# Patient Record
Sex: Male | Born: 1945
Health system: Southern US, Community
[De-identification: ages and names within clinical notes are randomized; demographics above are authoritative.]

## PROBLEM LIST (undated history)

## (undated) DIAGNOSIS — M8580 Other specified disorders of bone density and structure, unspecified site: Secondary | ICD-10-CM

## (undated) DIAGNOSIS — G540 Brachial plexus disorders: Secondary | ICD-10-CM

## (undated) DIAGNOSIS — I639 Cerebral infarction, unspecified: Secondary | ICD-10-CM

## (undated) DIAGNOSIS — E785 Hyperlipidemia, unspecified: Secondary | ICD-10-CM

## (undated) DIAGNOSIS — H918X9 Other specified hearing loss, unspecified ear: Secondary | ICD-10-CM

## (undated) DIAGNOSIS — G459 Transient cerebral ischemic attack, unspecified: Secondary | ICD-10-CM

## (undated) DIAGNOSIS — Z77098 Contact with and (suspected) exposure to other hazardous, chiefly nonmedicinal, chemicals: Secondary | ICD-10-CM

## (undated) DIAGNOSIS — H9190 Unspecified hearing loss, unspecified ear: Secondary | ICD-10-CM

## (undated) DIAGNOSIS — I5189 Other ill-defined heart diseases: Secondary | ICD-10-CM

## (undated) DIAGNOSIS — K219 Gastro-esophageal reflux disease without esophagitis: Secondary | ICD-10-CM

## (undated) DIAGNOSIS — C801 Malignant (primary) neoplasm, unspecified: Secondary | ICD-10-CM

## (undated) DIAGNOSIS — R41841 Cognitive communication deficit: Secondary | ICD-10-CM

## (undated) DIAGNOSIS — S62109A Fracture of unspecified carpal bone, unspecified wrist, initial encounter for closed fracture: Secondary | ICD-10-CM

## (undated) DIAGNOSIS — M7740 Metatarsalgia, unspecified foot: Secondary | ICD-10-CM

## (undated) DIAGNOSIS — M6530 Trigger finger, unspecified finger: Secondary | ICD-10-CM

## (undated) DIAGNOSIS — M722 Plantar fascial fibromatosis: Secondary | ICD-10-CM

## (undated) DIAGNOSIS — R0789 Other chest pain: Secondary | ICD-10-CM

## (undated) DIAGNOSIS — F0280 Dementia in other diseases classified elsewhere without behavioral disturbance: Secondary | ICD-10-CM

## (undated) DIAGNOSIS — M545 Low back pain, unspecified: Secondary | ICD-10-CM

## (undated) DIAGNOSIS — G219 Secondary parkinsonism, unspecified: Secondary | ICD-10-CM

## (undated) DIAGNOSIS — I471 Supraventricular tachycardia, unspecified: Secondary | ICD-10-CM

## (undated) DIAGNOSIS — R471 Dysarthria and anarthria: Secondary | ICD-10-CM

## (undated) DIAGNOSIS — J45909 Unspecified asthma, uncomplicated: Secondary | ICD-10-CM

## (undated) DIAGNOSIS — N400 Enlarged prostate without lower urinary tract symptoms: Secondary | ICD-10-CM

## (undated) DIAGNOSIS — M2550 Pain in unspecified joint: Secondary | ICD-10-CM

## (undated) DIAGNOSIS — R55 Syncope and collapse: Secondary | ICD-10-CM

## (undated) DIAGNOSIS — H918X3 Other specified hearing loss, bilateral: Secondary | ICD-10-CM

## (undated) DIAGNOSIS — R001 Bradycardia, unspecified: Secondary | ICD-10-CM

## (undated) DIAGNOSIS — G5600 Carpal tunnel syndrome, unspecified upper limb: Secondary | ICD-10-CM

## (undated) HISTORY — DX: Other specified disorders of bone density and structure, unspecified site: M85.80

## (undated) HISTORY — PX: WRIST SURGERY: SHX841

## (undated) HISTORY — DX: Brachial plexus disorders: G54.0

## (undated) HISTORY — DX: Bradycardia, unspecified: R00.1

## (undated) HISTORY — DX: Other specified hearing loss, bilateral: H91.8X3

## (undated) HISTORY — DX: Other chest pain: R07.89

## (undated) HISTORY — DX: Unspecified asthma, uncomplicated: J45.909

## (undated) HISTORY — DX: Metatarsalgia, unspecified foot: M77.40

## (undated) HISTORY — DX: Cerebral infarction, unspecified: I63.9

## (undated) HISTORY — DX: Hyperlipidemia, unspecified: E78.5

## (undated) HISTORY — DX: Low back pain, unspecified: M54.50

## (undated) HISTORY — DX: Pain in unspecified joint: M25.50

## (undated) HISTORY — PX: BACK SURGERY: SHX140

## (undated) HISTORY — DX: Fracture of unspecified carpal bone, unspecified wrist, initial encounter for closed fracture: S62.109A

## (undated) HISTORY — DX: Plantar fascial fibromatosis: M72.2

## (undated) HISTORY — DX: Contact with and (suspected) exposure to other hazardous, chiefly nonmedicinal, chemicals: Z77.098

## (undated) HISTORY — DX: Benign prostatic hyperplasia without lower urinary tract symptoms: N40.0

## (undated) HISTORY — DX: Other specified hearing loss, unspecified ear: H91.8X9

## (undated) HISTORY — DX: Malignant (primary) neoplasm, unspecified: C80.1

## (undated) HISTORY — DX: Transient cerebral ischemic attack, unspecified: G45.9

## (undated) HISTORY — DX: Low back pain: M54.5

## (undated) HISTORY — DX: Carpal tunnel syndrome, unspecified upper limb: G56.00

## (undated) HISTORY — DX: Trigger finger, unspecified finger: M65.30

---

## 2007-05-02 ENCOUNTER — Encounter: Admission: RE | Admit: 2007-05-02 | Discharge: 2007-05-02 | Payer: Self-pay | Admitting: Neurosurgery

## 2012-04-05 ENCOUNTER — Encounter: Payer: Self-pay | Admitting: *Deleted

## 2012-04-07 ENCOUNTER — Encounter: Payer: Self-pay | Admitting: *Deleted

## 2012-05-07 ENCOUNTER — Encounter: Payer: Self-pay | Admitting: *Deleted

## 2018-11-22 ENCOUNTER — Ambulatory Visit: Payer: Self-pay | Admitting: Cardiovascular Disease

## 2018-12-07 ENCOUNTER — Ambulatory Visit: Payer: Self-pay | Admitting: Cardiovascular Disease

## 2018-12-19 NOTE — Progress Notes (Signed)
Cardiology Office Note   Date:  12/20/2018   ID:  Roger Sanchez Jul 31, 1946, MRN [id number]  PCP:  Benito Mccreedy, MD  Cardiologist:  Kathlyn Sacramento, MD    Chief Complaint  Patient presents with   other    Consult for Echo C/o chest discomfort, sob and syncopal spells. Meds reviewed verbally with pt.      History of Present Illness: Roger Sanchez is a 73 y.o. male who was referred from the [place] for evaluation of dyspnea on exertion. He has past history of asthma and hyperlipidemia. History of TIA/ stroke with residual balance deficits. Hx of bradycardia. He was seen recently for concerning symptoms of  bradycardia but it was noted his bradycardia has been present for at least ten years. He had recent treadmill stress test in November 2019 which was negative for ischemia. Although he did experience chest pain with exertion at that time. He was bradycardic at baseline in the 50's, but his heart rate increased appropriately with exertion to 150 bpm.   He reports SOB and dizziness, adding he has never passed out. He works part time at [employer] and feels dizzy when he reaches the top flight of stairs. However, the dizziness symptoms have improved.   He stays active by walking frequently. He used to walk 5 miles at a time, but has only been able to walk 3 miles at a time. Occasionally when he is walking at an incline is when he notices an increase in SOB, dizziness and chest discomfort.   He has never smoked, but has been told he has COPD and asthma. Family history of heart diease at a fairly young age. He reports having a Holter monitor done at the [place] which was unremarkable.   Past Medical History:  Diagnosis Date   Arthralgia    shoulder    Asthma    Asymmetrical hearing loss    Atypical chest pain    Brachial plexus disorders    Bradycardia    Carpal tunnel syndrome    Cerebral infarction (HCC)    Closed fracture of wrist    Exposure to Agent Orange     Hyperlipidemia    Hypertrophy of prostate    benign    Low back pain    Lumbar spine pain    compression fracture    Malignant neoplasm (HCC)    skin    Metatarsalgia    Osteopenia    Plantar fibromatosis    Stroke (HCC)    Transient ischemic attack    Trigger finger     Past Surgical History:  Procedure Laterality Date   BACK SURGERY     WRIST SURGERY Right      Current Outpatient Medications  Medication Sig Dispense Refill   acetaminophen (TYLENOL) 325 MG tablet Take 650 mg by mouth every 6 (six) hours as needed.     albuterol (PROVENTIL) (2.5 MG/3ML) 0.083% nebulizer solution Take 2.5 mg by nebulization every 6 (six) hours as needed for wheezing or shortness of breath.     Albuterol Sulfate 108 (90 Base) MCG/ACT AEPB Inhale into the lungs.     aspirin EC 81 MG tablet Take 81 mg by mouth daily.     atorvastatin (LIPITOR) 20 MG tablet Take 20 mg by mouth daily.     clopidogrel (PLAVIX) 75 MG tablet Take 75 mg by mouth daily.     cyclobenzaprine (FLEXERIL) 10 MG tablet Take 10 mg by mouth 2 (two) times daily  as needed for muscle spasms.     diclofenac sodium (VOLTAREN) 1 % GEL Apply topically 4 (four) times daily.     montelukast (SINGULAIR) 10 MG tablet Take 10 mg by mouth at bedtime.     Multiple Vitamins-Minerals (MULTI-VITAMIN/MINERALS PO) Take by mouth.     naproxen (NAPROSYN) 500 MG tablet Take 500 mg by mouth 2 (two) times daily with a meal.     pantoprazole (PROTONIX) 40 MG tablet Take 40 mg by mouth daily.     ranitidine (ZANTAC) 150 MG capsule Take 150 mg by mouth 2 (two) times daily.     No current facility-administered medications for this visit.     Allergies:   Barley grass; Corn-containing products; Malt; Milk-related compounds; Peanut-containing drug products; Wheat bran; and Zocor [simvastatin]    Social History:  The patient  reports that he has never smoked. He has never used smokeless tobacco. He reports that he does not drink  alcohol or use drugs.   Family History:  The patient's family history includes Heart Problems in his father; Heart attack in his paternal aunt and paternal uncle.    ROS:  Please see the history of present illness.   Otherwise, review of systems are positive for none.   All other systems are reviewed and negative.    PHYSICAL EXAM: VS:  BP 138/81 (BP Location: Left Arm, Patient Position: Sitting, Cuff Size: Normal)    Pulse 60    Ht 5\' 6"  (1.676 m)    Wt 188 lb (85.3 kg)    SpO2 97%    BMI 30.34 kg/m  , BMI Body mass index is 30.34 kg/m. GEN: Well nourished, well developed, in no acute distress  HEENT: normal  Neck: no JVD, carotid bruits, or masses Cardiac: 2/6  holosystolic murmur at left sternal border, rubs, or gallops,no edema  Respiratory:  clear to auscultation bilaterally, normal work of breathing GI: soft, nontender, nondistended, + BS MS: no deformity or atrophy  Skin: warm and dry, no rash Neuro:  Strength and sensation are intact Psych: euthymic mood, full affect   EKG:  EKG is ordered today. The ekg ordered today demonstrates normal sinus rhythm with no significant ST or T wave changes.  Heart rate is 60 bpm.   Recent Labs: No results found for requested labs within last 8760 hours.    Lipid Panel No results found for: CHOL, TRIG, HDL, CHOLHDL, VLDL, LDLCALC, LDLDIRECT    Wt Readings from Last 3 Encounters:  12/20/18 188 lb (85.3 kg)     ASSESSMENT AND PLAN:  1.  Shortness of Breath: He does have a cardiac murmur at the left sternal border with no recent evaluation.  Recommend expedited Echocardiogram. Exertion chest pain is concerning for class II angina. Most recent stress test in December 2019 showed no evidence of ischemia.  If symptoms persist in spite of continued medical therapy, consider nuclear stress testing or CTA of the coronary arteries.  2.  Previous TIA/ stroke: He is Plavix for that indication  3.  HLD: Currently on atorvastatin. Recommend a  target LDL of > 70   4.  Dizziness with mild sinus tachycardia. The patient had a holter monitor the [place] which was unremarkable. If he has recurrent symptoms, consider prolonged monitoring.    Disposition:   Continue follow-up with cardiology at the [place].  We will obtain an echocardiogram and forward to them as requested.  I, Margit Banda am acting as a Education administrator for Kathlyn Sacramento, M.D.  I  have reviewed the above documentation for accuracy and completeness, and I agree with the above.   Signed, Kathlyn Sacramento, MD 12/20/18 [place], [place]

## 2018-12-20 ENCOUNTER — Encounter: Payer: Self-pay | Admitting: Cardiovascular Disease

## 2018-12-20 ENCOUNTER — Ambulatory Visit (INDEPENDENT_AMBULATORY_CARE_PROVIDER_SITE_OTHER): Payer: No Typology Code available for payment source | Admitting: Cardiovascular Disease

## 2018-12-20 VITALS — BP 138/81 | HR 60 | Ht 66.0 in | Wt 188.0 lb

## 2018-12-20 DIAGNOSIS — R0602 Shortness of breath: Secondary | ICD-10-CM

## 2018-12-20 DIAGNOSIS — R011 Cardiac murmur, unspecified: Secondary | ICD-10-CM

## 2018-12-20 DIAGNOSIS — E785 Hyperlipidemia, unspecified: Secondary | ICD-10-CM

## 2018-12-20 NOTE — Patient Instructions (Signed)
Medication Instructions:  No changes If you need a refill on your cardiac medications before your next appointment, please call your pharmacy.   Lab work: None ordered  Testing/Procedures: Your physician has requested that you have an echocardiogram. Echocardiography is a painless test that uses sound waves to create images of your heart. It provides your doctor with information about the size and shape of your heart and how well your heart's chambers and valves are working. You may receive an ultrasound enhancing agent through an IV if needed to better visualize your heart during the echo.This procedure takes approximately one hour. There are no restrictions for this procedure. This will take place at the [facility] clinic.    Follow-Up: Follow as needed with Dr. Fletcher Anon

## 2018-12-28 ENCOUNTER — Ambulatory Visit (INDEPENDENT_AMBULATORY_CARE_PROVIDER_SITE_OTHER): Payer: No Typology Code available for payment source

## 2018-12-28 ENCOUNTER — Encounter

## 2018-12-28 DIAGNOSIS — R0602 Shortness of breath: Secondary | ICD-10-CM | POA: Diagnosis not present

## 2020-01-20 DIAGNOSIS — H2513 Age-related nuclear cataract, bilateral: Secondary | ICD-10-CM | POA: Diagnosis not present

## 2020-09-07 DIAGNOSIS — H02834 Dermatochalasis of left upper eyelid: Secondary | ICD-10-CM | POA: Diagnosis not present

## 2020-09-07 DIAGNOSIS — H02831 Dermatochalasis of right upper eyelid: Secondary | ICD-10-CM | POA: Diagnosis not present

## 2020-09-10 DIAGNOSIS — H02403 Unspecified ptosis of bilateral eyelids: Secondary | ICD-10-CM | POA: Diagnosis not present

## 2021-03-18 ENCOUNTER — Other Ambulatory Visit: Payer: Self-pay

## 2021-03-18 ENCOUNTER — Encounter: Payer: Self-pay | Admitting: Ophthalmology

## 2021-03-22 NOTE — Discharge Instructions (Signed)
INSTRUCTIONS FOLLOWING OCULOPLASTIC SURGERY AMY M. FOWLER, MD  AFTER YOUR EYE SURGERY, THER ARE MANY THINGS WHICH YOU, THE PATIENT, CAN DO TO ASSURE THE BEST POSSIBLE RESULT FROM YOUR OPERATION.  THIS SHEET SHOULD BE REFERRED TO WHENEVER QUESTIONS ARISE.  IF THERE ARE ANY QUESTIONS NOT ANSWERED HERE, DO NOT HESITATE TO CALL OUR OFFICE AT [phone] OR [phone].  THERE IS ALWAYS SOMEONE AVAILABLE TO CALL IF QUESTIONS OR PROBLEMS ARISE.  VISION: Your vision may be blurred and out of focus after surgery until you are able to stop using your ointment, swelling resolves and your eye(s) heal. This may take 1 to 2 weeks at the least.  If your vision becomes gradually more dim or dark, this is not normal and you need to call our office immediately.  EYE CARE: For the first 48 hours after surgery, use ice packs frequently - "20 minutes on, 20 minutes off" - to help reduce swelling and bruising.  Small bags of frozen peas or corn make good ice packs along with cloths soaked in ice water.  If you are wearing a patch or other type of dressing following surgery, keep this on for the amount of time specified by your doctor.  For the first week following surgery, you will need to treat your stitches with great care.  It is OK to shower, but take care to not allow soapy water to run into your eye(s) to help reduce chances of infection.  You may gently clean the eyelashes and around the eye(s) with cotton balls and sterile water, BUT DO NOT RUB THE STITCHES VIGOROUSLY.  Keeping your stitches moist with ointment will help promote healing with minimal scar formation.  ACTIVITY: When you leave the surgery center, you should go home, rest and be inactive.  The eye(s) may feel scratchy and keeping the eyes closed will allow for faster healing.  The first week following surgery, avoid straining (anything making the face turn red) or lifting over 20 pounds.  Additionally, avoid bending which causes your head to go below  your waist.  Using your eyes will NOT harm them, so feel free to read, watch television, use the computer, etc as desired.  Driving depends on each individual, so check with your doctor if you have questions about driving. Do not wear contact lenses for about 2 weeks.  Do not wear eye makeup for 2 weeks.  Avoid swimming, hot tubs, gardening, and dusting for 1 to 2 weeks to reduce the risk of an infection.  MEDICATIONS:  You will be given a prescription for an ointment to use 4 times a day on your stitches.  You can use the ointment in your eyes if they feel scratchy or irritated.  If you eyelid(s) don't close completely when you sleep, put some ointment in your eyes before bedtime.  EMERGENCY: If you experience SEVERE EYE PAIN OR HEADACHE UNRELIEVED BY TYLENOL OR TRAMADOL, NAUSEA OR VOMITING, WORSENING REDNESS, OR WORSENING VISION (ESPECIALLY VISION THAT WAS INITIALLY BETTER) CALL [phone] OR [phone] DURING BUSINESS HOURS OR AFTER HOURS. General Anesthesia, Adult, Care After This sheet gives you information about how to care for yourself after your procedure. Your health care provider may also give you more specific instructions. If you have problems or questions, contact your health care provider. What can I expect after the procedure? After the procedure, the following side effects are common:  Pain or discomfort at the IV site.  Nausea.  Vomiting.  Sore throat.  Trouble concentrating.  Feeling   cold or chills.  Feeling weak or tired.  Sleepiness and fatigue.  Soreness and body aches. These side effects can affect parts of the body that were not involved in surgery. Follow these instructions at home: For the time period you were told by your health care provider:  Rest.  Do not participate in activities where you could fall or become injured.  Do not drive or use machinery.  Do not drink alcohol.  Do not take sleeping pills or medicines that cause drowsiness.  Do  not make important decisions or sign legal documents.  Do not take care of children on your own.   Eating and drinking  Follow any instructions from your health care provider about eating or drinking restrictions.  When you feel hungry, start by eating small amounts of foods that are soft and easy to digest (bland), such as toast. Gradually return to your regular diet.  Drink enough fluid to keep your urine pale yellow.  If you vomit, rehydrate by drinking water, juice, or clear broth. General instructions  If you have sleep apnea, surgery and certain medicines can increase your risk for breathing problems. Follow instructions from your health care provider about wearing your sleep device: ? Anytime you are sleeping, including during daytime naps. ? While taking prescription pain medicines, sleeping medicines, or medicines that make you drowsy.  Have a responsible adult stay with you for the time you are told. It is important to have someone help care for you until you are awake and alert.  Return to your normal activities as told by your health care provider. Ask your health care provider what activities are safe for you.  Take over-the-counter and prescription medicines only as told by your health care provider.  If you smoke, do not smoke without supervision.  Keep all follow-up visits as told by your health care provider. This is important. Contact a health care provider if:  You have nausea or vomiting that does not get better with medicine.  You cannot eat or drink without vomiting.  You have pain that does not get better with medicine.  You are unable to pass urine.  You develop a skin rash.  You have a fever.  You have redness around your IV site that gets worse. Get help right away if:  You have difficulty breathing.  You have chest pain.  You have blood in your urine or stool, or you vomit blood. Summary  After the procedure, it is common to have a sore  throat or nausea. It is also common to feel tired.  Have a responsible adult stay with you for the time you are told. It is important to have someone help care for you until you are awake and alert.  When you feel hungry, start by eating small amounts of foods that are soft and easy to digest (bland), such as toast. Gradually return to your regular diet.  Drink enough fluid to keep your urine pale yellow.  Return to your normal activities as told by your health care provider. Ask your health care provider what activities are safe for you. This information is not intended to replace advice given to you by your health care provider. Make sure you discuss any questions you have with your health care provider. Document Revised: 07/09/2020 Document Reviewed: 02/06/2020 Elsevier Patient Education  2021 [employer]vier Inc.  

## 2021-03-24 DIAGNOSIS — L570 Actinic keratosis: Secondary | ICD-10-CM | POA: Diagnosis not present

## 2021-03-24 DIAGNOSIS — L905 Scar conditions and fibrosis of skin: Secondary | ICD-10-CM | POA: Diagnosis not present

## 2021-03-24 DIAGNOSIS — D2272 Melanocytic nevi of left lower limb, including hip: Secondary | ICD-10-CM | POA: Diagnosis not present

## 2021-03-24 DIAGNOSIS — D2262 Melanocytic nevi of left upper limb, including shoulder: Secondary | ICD-10-CM | POA: Diagnosis not present

## 2021-03-24 DIAGNOSIS — D2271 Melanocytic nevi of right lower limb, including hip: Secondary | ICD-10-CM | POA: Diagnosis not present

## 2021-03-24 DIAGNOSIS — L821 Other seborrheic keratosis: Secondary | ICD-10-CM | POA: Diagnosis not present

## 2021-03-24 DIAGNOSIS — D2261 Melanocytic nevi of right upper limb, including shoulder: Secondary | ICD-10-CM | POA: Diagnosis not present

## 2021-03-24 DIAGNOSIS — D225 Melanocytic nevi of trunk: Secondary | ICD-10-CM | POA: Diagnosis not present

## 2021-03-26 ENCOUNTER — Ambulatory Visit: Payer: PPO | Admitting: Anesthesiology

## 2021-03-26 ENCOUNTER — Ambulatory Visit
Admission: RE | Admit: 2021-03-26 | Discharge: 2021-03-26 | Disposition: A | Discharge status: Home / Self Care | Payer: PPO | Attending: Ophthalmology | Admitting: Ophthalmology

## 2021-03-26 ENCOUNTER — Encounter
Admission: RE | Disposition: A | Discharge status: Home / Self Care | Payer: Self-pay | Source: Home / Self Care | Attending: Ophthalmology

## 2021-03-26 ENCOUNTER — Other Ambulatory Visit: Payer: Self-pay

## 2021-03-26 ENCOUNTER — Encounter: Payer: Self-pay | Admitting: Ophthalmology

## 2021-03-26 DIAGNOSIS — Z8673 Personal history of transient ischemic attack (TIA), and cerebral infarction without residual deficits: Secondary | ICD-10-CM | POA: Insufficient documentation

## 2021-03-26 DIAGNOSIS — H57813 Brow ptosis, bilateral: Secondary | ICD-10-CM | POA: Insufficient documentation

## 2021-03-26 DIAGNOSIS — Z79899 Other long term (current) drug therapy: Secondary | ICD-10-CM | POA: Insufficient documentation

## 2021-03-26 DIAGNOSIS — H02834 Dermatochalasis of left upper eyelid: Secondary | ICD-10-CM | POA: Insufficient documentation

## 2021-03-26 DIAGNOSIS — Z91011 Allergy to milk products: Secondary | ICD-10-CM | POA: Diagnosis not present

## 2021-03-26 DIAGNOSIS — Z888 Allergy status to other drugs, medicaments and biological substances status: Secondary | ICD-10-CM | POA: Diagnosis not present

## 2021-03-26 DIAGNOSIS — Z91010 Allergy to peanuts: Secondary | ICD-10-CM | POA: Diagnosis not present

## 2021-03-26 DIAGNOSIS — Z91018 Allergy to other foods: Secondary | ICD-10-CM | POA: Insufficient documentation

## 2021-03-26 DIAGNOSIS — H02831 Dermatochalasis of right upper eyelid: Secondary | ICD-10-CM | POA: Insufficient documentation

## 2021-03-26 DIAGNOSIS — Z7982 Long term (current) use of aspirin: Secondary | ICD-10-CM | POA: Diagnosis not present

## 2021-03-26 DIAGNOSIS — H02403 Unspecified ptosis of bilateral eyelids: Secondary | ICD-10-CM | POA: Diagnosis not present

## 2021-03-26 DIAGNOSIS — Z85828 Personal history of other malignant neoplasm of skin: Secondary | ICD-10-CM | POA: Insufficient documentation

## 2021-03-26 DIAGNOSIS — Z7902 Long term (current) use of antithrombotics/antiplatelets: Secondary | ICD-10-CM | POA: Insufficient documentation

## 2021-03-26 HISTORY — DX: Unspecified hearing loss, unspecified ear: H91.90

## 2021-03-26 HISTORY — PX: BROW LIFT: SHX178

## 2021-03-26 HISTORY — DX: Gastro-esophageal reflux disease without esophagitis: K21.9

## 2021-03-26 SURGERY — BLEPHAROPLASTY
Anesthesia: Monitor Anesthesia Care | Laterality: Bilateral

## 2021-03-26 MED ORDER — PROPOFOL 500 MG/50ML IV EMUL
INTRAVENOUS | Status: DC | PRN
  Administered 2021-03-26: 25 ug/kg/min via INTRAVENOUS

## 2021-03-26 MED ORDER — ALFENTANIL 500 MCG/ML IJ INJ
INJECTION | INTRAVENOUS | Status: DC | PRN
  Administered 2021-03-26 (×2): 250 ug via INTRAVENOUS

## 2021-03-26 MED ORDER — ACETAMINOPHEN 325 MG PO TABS: 325.0000 mg | ORAL_TABLET | ORAL | Status: DC | PRN

## 2021-03-26 MED ORDER — ACETAMINOPHEN 160 MG/5ML PO SOLN: 325.0000 mg | ORAL | Status: DC | PRN

## 2021-03-26 MED ORDER — ERYTHROMYCIN 5 MG/GM OP OINT: TOPICAL_OINTMENT | OPHTHALMIC | 2 refills | Status: DC

## 2021-03-26 MED ORDER — LACTATED RINGERS IV SOLN: INTRAVENOUS | Status: DC

## 2021-03-26 MED ORDER — BSS IO SOLN
INTRAOCULAR | Status: DC | PRN
  Administered 2021-03-26: 30 mL

## 2021-03-26 MED ORDER — TETRACAINE HCL 0.5 % OP SOLN
OPHTHALMIC | Status: DC | PRN
  Administered 2021-03-26: 2 [drp] via OPHTHALMIC

## 2021-03-26 MED ORDER — LIDOCAINE HCL (CARDIAC) PF 100 MG/5ML IV SOSY
PREFILLED_SYRINGE | INTRAVENOUS | Status: DC | PRN
  Administered 2021-03-26: 40 mg via INTRAVENOUS

## 2021-03-26 MED ORDER — MIDAZOLAM HCL 2 MG/2ML IJ SOLN
INTRAMUSCULAR | Status: DC | PRN
  Administered 2021-03-26 (×2): 1 mg via INTRAVENOUS

## 2021-03-26 MED ORDER — LIDOCAINE-EPINEPHRINE 2 %-1:100000 IJ SOLN
INTRAMUSCULAR | Status: DC | PRN
  Administered 2021-03-26: 7 mL via OPHTHALMIC

## 2021-03-26 MED ORDER — TRAMADOL HCL 50 MG PO TABS: ORAL_TABLET | ORAL | 0 refills | Status: DC

## 2021-03-26 SURGICAL SUPPLY — 38 items
APPLICATOR COTTON TIP WD 3 STR (MISCELLANEOUS) ×2 IMPLANT
BLADE SURG 15 STRL LF DISP TIS (BLADE) ×1 IMPLANT
BLADE SURG 15 STRL SS (BLADE) ×4
CORD BIP STRL DISP 12FT (MISCELLANEOUS) ×2 IMPLANT
GAUZE 4X4 16PLY ~~LOC~~+RFID DBL (SPONGE) ×1 IMPLANT
GAUZE SPONGE 4X4 12PLY STRL (GAUZE/BANDAGES/DRESSINGS) ×2 IMPLANT
GLOVE SURG LX 7.0 MICRO (GLOVE) ×2
GLOVE SURG LX STRL 7.0 MICRO (GLOVE) ×2 IMPLANT
GOWN STRL REUS W/ TWL LRG LVL3 (GOWN DISPOSABLE) ×1 IMPLANT
GOWN STRL REUS W/TWL LRG LVL3 (GOWN DISPOSABLE) ×2
MARKER SKIN XFINE TIP W/RULER (MISCELLANEOUS) ×2 IMPLANT
NDL FILTER BLUNT 18X1 1/2 (NEEDLE) ×1 IMPLANT
NDL HYPO 30X.5 LL (NEEDLE) ×2 IMPLANT
NEEDLE FILTER BLUNT 18X 1/2SAF (NEEDLE) ×1
NEEDLE FILTER BLUNT 18X1 1/2 (NEEDLE) ×1 IMPLANT
NEEDLE HYPO 30X.5 LL (NEEDLE) ×4 IMPLANT
PACK ENT CUSTOM (PACKS) ×2 IMPLANT
SOL PREP PVP 2OZ (MISCELLANEOUS) ×2
SOLUTION PREP PVP 2OZ (MISCELLANEOUS) ×1 IMPLANT
SPONGE GAUZE 2X2 8PLY STRL LF (GAUZE/BANDAGES/DRESSINGS) ×20 IMPLANT
SUT CHROMIC 4-0 (SUTURE)
SUT CHROMIC 4-0 M2 12X2 ARM (SUTURE)
SUT CHROMIC 5 0 P 3 (SUTURE) ×2 IMPLANT
SUT ETHILON 4 0 CL P 3 (SUTURE) IMPLANT
SUT GUT PLAIN 6-0 1X18 ABS (SUTURE) ×2 IMPLANT
SUT MERSILENE 4-0 S-2 (SUTURE) IMPLANT
SUT PROLENE 5 0 P 3 (SUTURE) ×2 IMPLANT
SUT PROLENE 6 0 P 1 18 (SUTURE) ×2 IMPLANT
SUT SILK 4 0 G 3 (SUTURE) IMPLANT
SUT VIC AB 5-0 P-3 18X BRD (SUTURE) IMPLANT
SUT VIC AB 5-0 P3 18 (SUTURE)
SUT VICRYL 6-0  S14 CTD (SUTURE)
SUT VICRYL 6-0 S14 CTD (SUTURE) IMPLANT
SUT VICRYL 7 0 TG140 8 (SUTURE) IMPLANT
SUTURE CHRMC 4-0 M2 12X2 ARM (SUTURE) IMPLANT
SYR 10ML LL (SYRINGE) ×2 IMPLANT
SYR 3ML LL SCALE MARK (SYRINGE) ×2 IMPLANT
WATER STERILE IRR 250ML POUR (IV SOLUTION) ×2 IMPLANT

## 2021-03-26 NOTE — Interval H&P Note (Signed)
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  The various methods of treatment have been discussed with the patient and family. After consideration of risks, benefits and other options for treatment, the patient has consented to  Procedure(s): BLEPHAROPLASTY UPPER EYELID; W/EXCESS SKIN BROW PTOSIS REPAIR  BLEPHAROPTOSIS REPAIR; RESECT EX BILATERAL (Bilateral) as a surgical intervention.  The patient's history has been reviewed, patient examined, no change in status, stable for surgery.  I have reviewed the patient's chart and labs.  Questions were answered to the patient's satisfaction.     Vickki Muff, Maks Cavallero M

## 2021-03-26 NOTE — Anesthesia Preprocedure Evaluation (Addendum)
Anesthesia Evaluation  Patient identified by MRN, date of birth, ID band Patient awake    Reviewed: Allergy & Precautions, H&P , NPO status , Patient's Chart, lab work & pertinent test results, reviewed documented beta blocker date and time   Airway Mallampati: I  TM Distance: >3 FB Neck ROM: full    Dental no notable dental hx.    Pulmonary asthma ,    Pulmonary exam normal breath sounds clear to auscultation       Cardiovascular Exercise Tolerance: Good negative cardio ROS Normal cardiovascular exam Rhythm:regular Rate:Normal     Neuro/Psych CVA age 51 without deficits  On Plavix, stopped since 5/14  CVA negative psych ROS   GI/Hepatic Neg liver ROS, GERD  ,  Endo/Other  negative endocrine ROS  Renal/GU negative Renal ROS  negative genitourinary   Musculoskeletal   Abdominal   Peds  Hematology negative hematology ROS (+)   Anesthesia Other Findings   Reproductive/Obstetrics negative OB ROS                             Anesthesia Physical Anesthesia Plan  ASA: II  Anesthesia Plan: MAC   Post-op Pain Management:    Induction:   PONV Risk Score and Plan:   Airway Management Planned:   Additional Equipment:   Intra-op Plan:   Post-operative Plan:   Informed Consent: I have reviewed the patients History and Physical, chart, labs and discussed the procedure including the risks, benefits and alternatives for the proposed anesthesia with the patient or authorized representative who has indicated his/her understanding and acceptance.     Dental Advisory Given  Plan Discussed with: CRNA  Anesthesia Plan Comments:        Anesthesia Quick Evaluation

## 2021-03-26 NOTE — Op Note (Signed)
Preoperative Diagnosis:   1.  Visually significant bilateral  brow ptosis.  2.  Visually significant blepharoptosis bilateral  Upper Eyelid(s) 3.  Visually significant dermatochalasis bilateral  Upper Eyelid(s)  Postoperative Diagnosis: Same.   Procedure(s) Performed:   1. bilateral  Direct brow lift to improve vision.  2.  Blepharoptosis repair with levator aponeurosis advancement bilateral  Upper Eyelid(s) 3.  Upper eyelid blepharoplasty with excess skin excision  bilateral  Upper Eyelid(s)  Surgeon: Philis Pique. Vickki Muff, M.D.   Assistants: None   Anesthesia: MAC  Specimens: None.  Estimated Blood Loss: Minimal.  Complications: None.  Operative Findings: None Dictated  PROCEDURE:   Allergies were reviewed and the patient is allergic to barley grass, corn-containing products, malt, milk-related compounds, peanut-containing drug products, wheat bran, and zocor [simvastatin]..   After the risks, benefits, complications and alternatives were discussed with the patient, appropriate informed consent was obtained. While seated in an upright position and looking in primary gaze, the amount of supra-brow skin to be removed was measured and marked in an elliptical pattern. The patient was then brought to the operating suite and reclined supine.  Timeout was conducted and the patient was sedated. Local anesthetic consisting of a 50-50 mixture of 2% lidocaine with epinephrine and 0.75% bupivacaine with added Hylenex was injected subcutaneously to the both  brow region(s) and down to the periosteum and  subcutaneously to both  upper eyelid(s). After adequate local was instilled, the patient was prepped and draped in the usual sterile fashion for eyelid surgery.   Attention was turned to the right brow region. A #15 blade was used to create a bevelled incision along the premarked incision line. A skin and subcutaneous tissue flap was then excised and hemostasis was obtained with bipolar cautery. The  deep tissues were reapproximated with interrupted vertical 5-0 chromic sutures. The skin margin was reapproximated with a running locking 5-0 Prolene suture. Attention was then turned to the opposite brow region where the same procedure was performed in the same manner.    Attention was then turned to the upper eyelids. A 9m upper eyelid crease incision line was marked with calipers on both  upper eyelid(s).  A pinch test was used to estimate the amount of excess skin to remove and this was marked in standard blepharoplasty style fashion. Attention was turned to the right  upper eyelid. A #15 blade was used to open the premarked incision line. A Skin only flap was excised and hemostasis was obtained with bipolar cautery.   Westcott scissors were then used to transect through orbicularis for the length of the incision down to the tarsal plate. Epitarsus was dissected to create a smooth surface to suture to. Dissection was then carried superiorly in the plane between orbicularis and orbital septum. Once the preaponeurotic fat pocket was identified, the orbital septum was opened. This revealed the levator and its aponeurosis.    Attention was then turned to the opposite eyelid where the same procedure was performed in the same manner.   3 interrupted 6-0 Prolene sutures were then passed partial thickness through the tarsal plates of both  upper eyelid(s). These sutures were placed in line with the mid pupillary, medial limbal, and lateral limbal lines. The sutures were fixed to the levator aponeurosis and adjusted until a nice lid height and contour were achieved. Once nice symmetry was achieved, the skin incisions were closed with a combination of interrupted and  running 6-0 plain gut suture.   The patient tolerated the procedure  well. Erythromycin ophthalmic ointment was applied to the incision site(s) followed by ice packs.The patient was taken to the recovery area where he recovered without  difficulty.  Post-Op Plan/Instructions:   The patient was instructed to use ice packs frequently for the next 48 hours. he was instructed to use Erythromycin ophthalmic ointment on his incisions 4 times a day for the next 12 to 14 days. he was given a prescription for tramadol (or similar) for pain control should Tylenol not be effective. he was asked to to follow up in 10-12 days time for suture removal or sooner as needed for problems.  Maahir Horst M. Vickki Muff, M.D. Ophthalmology

## 2021-03-26 NOTE — Anesthesia Postprocedure Evaluation (Signed)
Anesthesia Post Note  Patient: Roger Sanchez [facility]  Procedure(s) Performed: BLEPHAROPLASTY UPPER EYELID; W/EXCESS SKIN BROW PTOSIS REPAIR  BLEPHAROPTOSIS REPAIR; RESECT EX BILATERAL (Bilateral )     Patient location during evaluation: PACU Anesthesia Type: MAC Level of consciousness: awake and alert Pain management: pain level controlled Vital Signs Assessment: post-procedure vital signs reviewed and stable Respiratory status: spontaneous breathing, nonlabored ventilation, respiratory function stable and patient connected to nasal cannula oxygen Cardiovascular status: stable and blood pressure returned to baseline Postop Assessment: no apparent nausea or vomiting Anesthetic complications: no   No complications documented.  [name]

## 2021-03-26 NOTE — Anesthesia Procedure Notes (Signed)
Procedure Name: MAC Date/Time: 03/26/2021 9:33 AM Performed by: Silvana Newness, CRNA Pre-anesthesia Checklist: Patient identified, Emergency Drugs available, Suction available, Patient being monitored and Timeout performed Patient Re-evaluated:Patient Re-evaluated prior to induction Oxygen Delivery Method: Nasal cannula Placement Confirmation: positive ETCO2

## 2021-03-26 NOTE — H&P (Signed)
[place]: [facility]  Primary Care Physician:  Benito Mccreedy, MD Ophthalmologist: Dr. Philis Pique. Vickki Muff, M.D.  Pre-Procedure History & Physical: HPI:  Roger Sanchez is a 75 y.o. male here for periocular surgery.   Past Medical History:  Diagnosis Date  . Arthralgia    shoulder   . Asthma   . Asymmetrical hearing loss   . Atypical chest pain   . Brachial plexus disorders   . Bradycardia   . Carpal tunnel syndrome   . Cerebral infarction ([place])   . Closed fracture of wrist   . Exposure to Agent Orange   . GERD (gastroesophageal reflux disease)   . HOH (hard of hearing)    bilateral  . Hyperlipidemia   . Hypertrophy of prostate    benign   . Low back pain   . Lumbar spine pain    compression fracture   . Malignant neoplasm (HCC)    skin   . Metatarsalgia   . Osteopenia   . Plantar fibromatosis   . Stroke [facility]) 75 yrs old   no residual effects  . Transient ischemic attack   . Trigger finger     Past Surgical History:  Procedure Laterality Date  . BACK SURGERY    . WRIST SURGERY Right     Prior to Admission medications   Medication Sig Start Date End Date Taking? Authorizing Provider  acetaminophen (TYLENOL) 325 MG tablet Take 650 mg by mouth every 6 (six) hours as needed.   Yes [provider]  Albuterol Sulfate 108 (90 Base) MCG/ACT AEPB Inhale 2 puffs into the lungs QID.   Yes [provider]  ascorbic acid (VITAMIN C) 500 MG tablet Take 500 mg by mouth daily.   Yes [provider]  atorvastatin (LIPITOR) 20 MG tablet Take 10 mg by mouth at bedtime.   Yes [provider]  famotidine (PEPCID) 20 MG tablet Take 20 mg by mouth 2 (two) times daily.   Yes [provider]  GUAIFENESIN CR PO Take by mouth.   Yes [provider]  pantoprazole (PROTONIX) 40 MG tablet Take 40 mg by mouth 2 (two) times daily.   Yes [provider]  sildenafil (VIAGRA) 100 MG tablet Take 100 mg by mouth daily as  needed for erectile dysfunction.   Yes [provider]  tamsulosin (FLOMAX) 0.4 MG CAPS capsule Take 0.4 mg by mouth.   Yes [provider]  albuterol (PROVENTIL) (2.5 MG/3ML) 0.083% nebulizer solution Take 2.5 mg by nebulization every 6 (six) hours as needed for wheezing or shortness of breath. Patient not taking: Reported on 03/26/2021    [provider]  aspirin EC 81 MG tablet Take 81 mg by mouth daily.    [provider]  clopidogrel (PLAVIX) 75 MG tablet Take 75 mg by mouth daily.    [provider]  clotrimazole (LOTRIMIN) 1 % cream Apply 1 application topically 2 (two) times daily. Patient not taking: Reported on 03/26/2021    [provider]  cyclobenzaprine (FLEXERIL) 10 MG tablet Take 10 mg by mouth at bedtime. As needed Patient not taking: Reported on 03/26/2021    [provider]  diclofenac sodium (VOLTAREN) 1 % GEL Apply topically 4 (four) times daily. Patient not taking: Reported on 03/26/2021    [provider]  montelukast (SINGULAIR) 10 MG tablet Take 10 mg by mouth at bedtime. Patient not taking: Reported on 03/26/2021    [provider]  Multiple Vitamins-Minerals (MULTI-VITAMIN/MINERALS  PO) Take by mouth.    [provider]  naproxen (NAPROSYN) 500 MG tablet Take 500 mg by mouth 2 (two) times daily with a meal.    [provider]  ranitidine (ZANTAC) 150 MG capsule Take 150 mg by mouth 2 (two) times daily. Patient not taking: Reported on 03/26/2021    [provider]  sertraline (ZOLOFT) 25 MG tablet Take 25 mg by mouth daily. Patient not taking: Reported on 03/26/2021    [provider]    Allergies as of 11/30/2020 - Review Complete 12/20/2018  Allergen Reaction Noted  . Barley grass  11/12/2018  . Corn-containing products  11/12/2018  . Malt  11/12/2018  . Milk-related compounds  11/12/2018  . Peanut-containing drug products  11/12/2018  . Wheat bran   11/12/2018  . Zocor [simvastatin]  11/12/2018    Family History  Problem Relation Age of Onset  . Heart Problems Father   . Heart attack Paternal Aunt   . Heart attack Paternal Uncle     Social History   Socioeconomic History  . Marital status: Married    Spouse name: Not on file  . Number of children: Not on file  . Years of education: Not on file  . Highest education level: Not on file  Occupational History  . Not on file  Tobacco Use  . Smoking status: Never Smoker  . Smokeless tobacco: Never Used  Substance and Sexual Activity  . Alcohol use: Never  . Drug use: Never  . Sexual activity: Not on file  Other Topics Concern  . Not on file  Social History Narrative  . Not on file   Social Determinants of Health   Financial Resource Strain: Not on file  Food Insecurity: Not on file  Transportation Needs: Not on file  Physical Activity: Not on file  Stress: Not on file  Social Connections: Not on file  Intimate Partner Violence: Not on file    Review of Systems: See HPI, otherwise negative ROS  Physical Exam: BP 127/76   Pulse (!) 50   Temp 97.7 F (36.5 C) (Temporal)   Ht 5\' 7"  (1.702 m)   Wt 79.4 kg   SpO2 98%   BMI 27.41 kg/m  General:   Alert and cooperative in NAD Head:  Normocephalic and atraumatic. Respiratory:  Normal work of breathing.  Impression/Plan: Roger Sanchez is here for periocular surgery.  Risks, benefits, limitations, and alternatives regarding surgery have been reviewed with the patient.  Questions have been answered.  All parties agreeable.   Karle Starch, MD  03/26/2021, 9:20 AM

## 2021-03-26 NOTE — Transfer of Care (Signed)
Immediate Anesthesia Transfer of Care Note  Patient: [facility]  Procedure(s) Performed: BLEPHAROPLASTY UPPER EYELID; W/EXCESS SKIN BROW PTOSIS REPAIR  BLEPHAROPTOSIS REPAIR; RESECT EX BILATERAL (Bilateral )  Patient Location: PACU  Anesthesia Type: MAC  Level of Consciousness: awake, alert  and patient cooperative  Airway and Oxygen Therapy: Patient Spontanous Breathing and Patient connected to supplemental oxygen  Post-op Assessment: Post-op Vital signs reviewed, Patient's Cardiovascular Status Stable, Respiratory Function Stable, Patent Airway and No signs of Nausea or vomiting  Post-op Vital Signs: Reviewed and stable  Complications: No complications documented.

## 2021-03-29 ENCOUNTER — Encounter: Payer: Self-pay | Admitting: Ophthalmology

## 2021-10-19 DIAGNOSIS — H2513 Age-related nuclear cataract, bilateral: Secondary | ICD-10-CM | POA: Diagnosis not present

## 2022-01-21 ENCOUNTER — Other Ambulatory Visit: Payer: Self-pay | Admitting: Neurology

## 2022-01-21 DIAGNOSIS — F0280 Dementia in other diseases classified elsewhere without behavioral disturbance: Secondary | ICD-10-CM

## 2022-02-02 ENCOUNTER — Ambulatory Visit
Admission: RE | Admit: 2022-02-02 | Discharge: 2022-02-02 | Disposition: A | Discharge status: Home / Self Care | Payer: PPO | Source: Ambulatory Visit | Attending: Neurology | Admitting: Neurology

## 2022-02-02 DIAGNOSIS — F0280 Dementia in other diseases classified elsewhere without behavioral disturbance: Secondary | ICD-10-CM | POA: Diagnosis present

## 2022-02-02 DIAGNOSIS — G3183 Dementia with Lewy bodies: Secondary | ICD-10-CM | POA: Insufficient documentation

## 2022-05-19 IMAGING — MR MR HEAD W/O CM
12 series · 48 of 48 positions shown · non-contrast
Comparison: None.

CLINICAL DATA: Provided history: Nail body dementia without
behavioral disturbance. Additional history provided by scanning
technologist: Confusion, slurred speech, voice weakness, symptoms
for 1 year.

EXAM:
MRI HEAD WITHOUT CONTRAST
TECHNIQUE: Multiplanar, multiecho pulse sequences of the brain and surrounding
structures were obtained without intravenous contrast.

[Series 5: ax dwi_tracew · axial · 3.0mm · 0.71mm/px · z∈[-55,+108]mm · 6 of 112 slices shown]
[im 1/112]
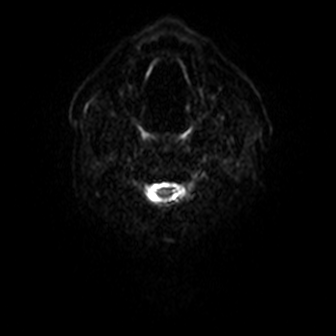
[im 23/112]
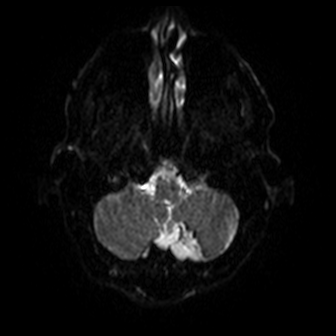
[im 45/112]
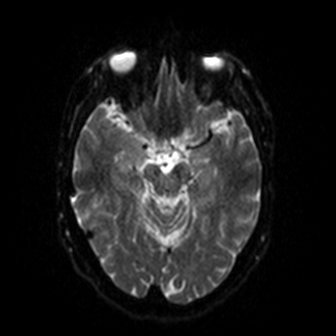
[im 67/112]
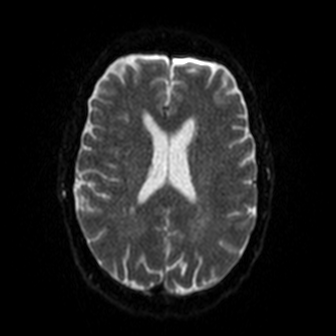
[im 89/112]
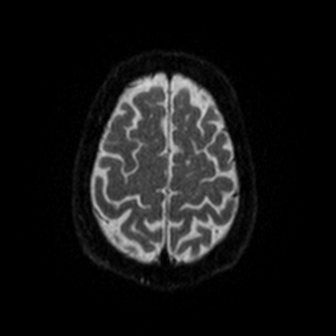
[im 112/112]
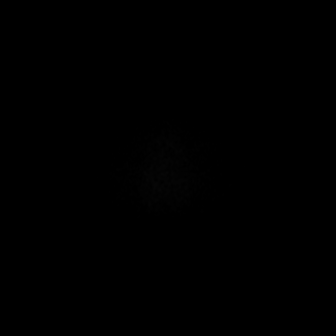

[Series 6: ax dwi_adc · axial · 3.0mm · 0.71mm/px · z∈[-55,+108]mm · 3 of 55 slices shown]
[im 1/55]
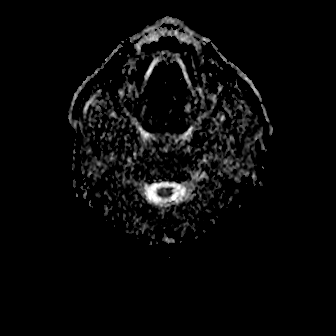
[im 28/55]
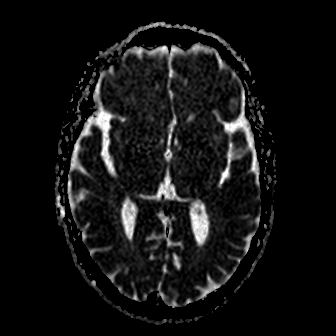
[im 55/55]
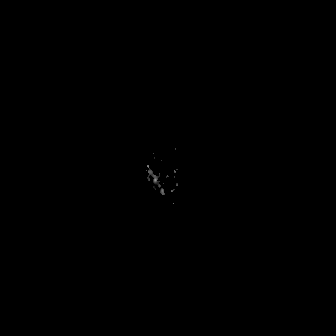

[Series 7: cor dwi_tracew · coronal · 5.0mm · 0.68mm/px · 2 of 42 slices shown]
[im 1/42]
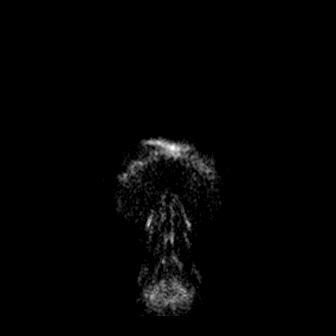
[im 42/42]
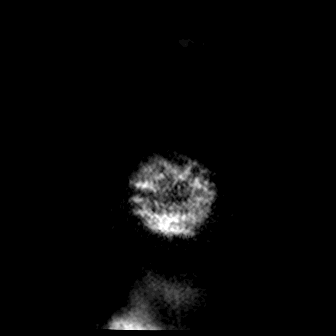

[Series 8: cor dwi_adc · coronal · 5.0mm · 0.68mm/px · 3 of 42 slices shown]
[im 1/42]
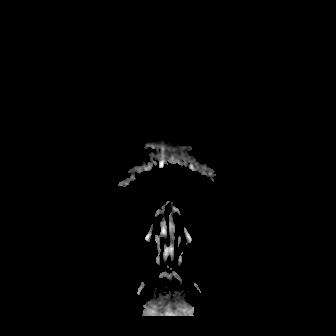
[im 21/42]
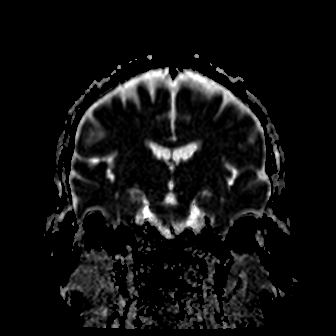
[im 42/42]
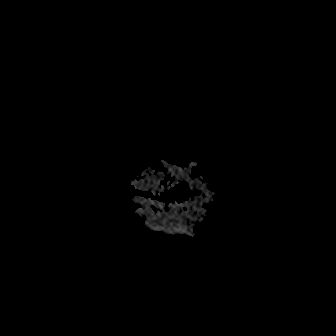

[Series 9: T1 · sagittal · 5.0mm · 0.47mm/px · 2 of 24 slices shown (1 of 2)]
[im 1/24]
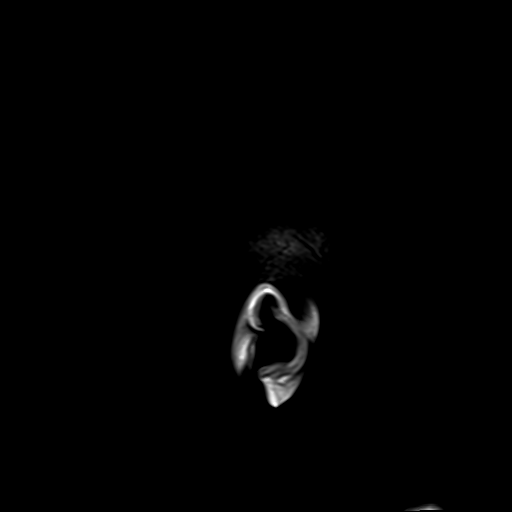
[im 24/24]
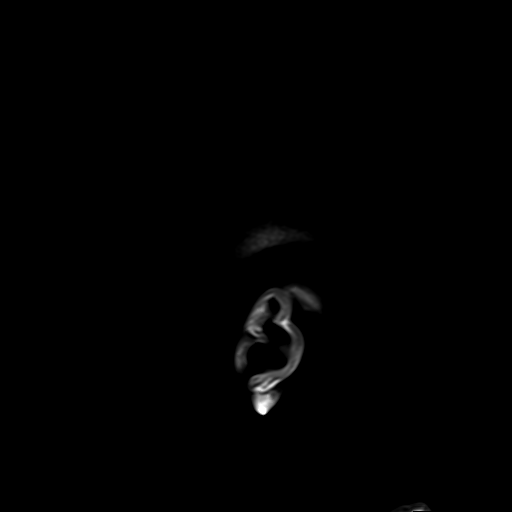

[Series 10: T2 · axial · 5.0mm · 0.86mm/px · z∈[-48,+105]mm · 2 of 27 slices shown (1 of 2)]
[im 1/27]
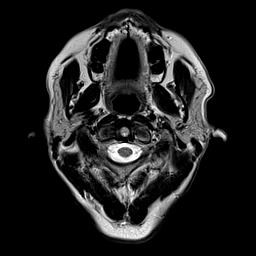
[im 27/27]
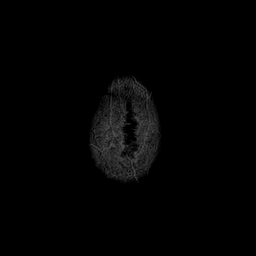

[Series 11: ax swi_mag · axial · 3.0mm · 0.90mm/px · z∈[-54,+109]mm · 4 of 56 slices shown]
[im 1/56]
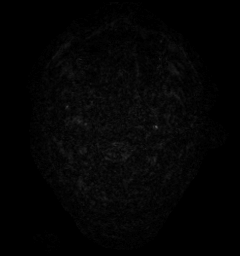
[im 19/56]
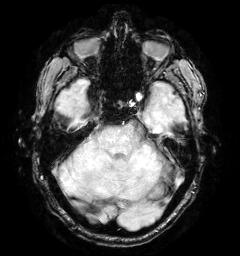
[im 37/56]
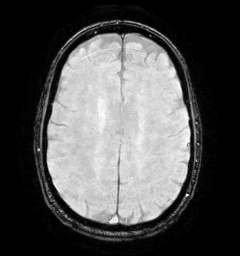
[im 56/56]
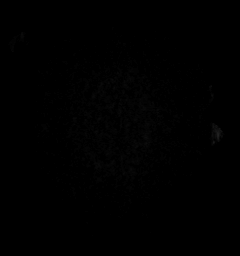

[Series 12: ax swi_pha · axial · 3.0mm · 0.90mm/px · z∈[-54,+109]mm · 4 of 56 slices shown]
[im 1/56]
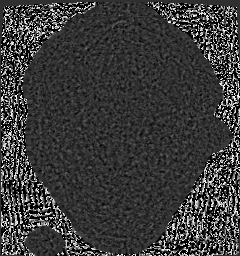
[im 19/56]
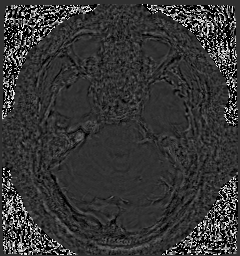
[im 37/56]
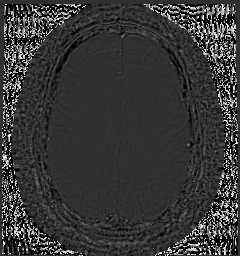
[im 56/56]
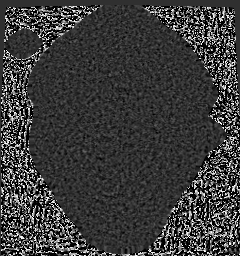

[Series 13: ax swi_swi · axial · 3.0mm · 0.90mm/px · z∈[-54,+109]mm · 4 of 56 slices shown]
[im 1/56]
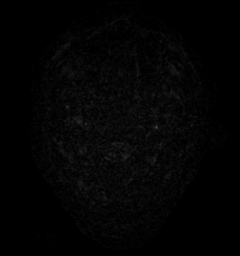
[im 19/56]
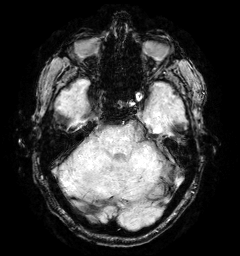
[im 37/56]
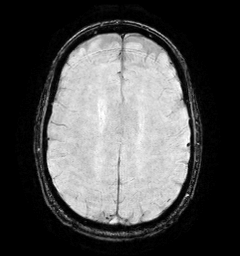
[im 56/56]
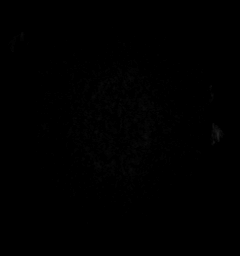

[Series 15: FLAIR · axial · 3.0mm · 0.69mm/px · z∈[-51,+108]mm · 4 of 55 slices shown]
[im 1/55]
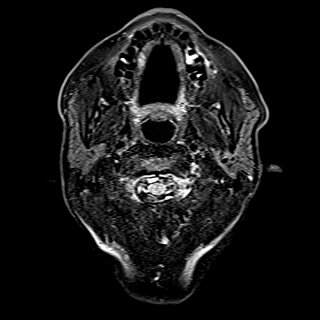
[im 19/55]
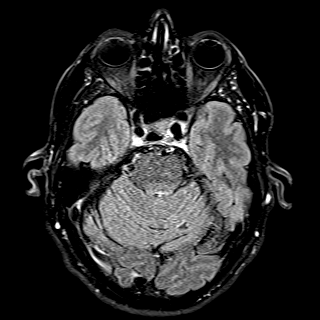
[im 37/55]
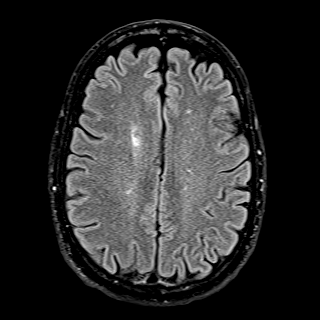
[im 55/55]
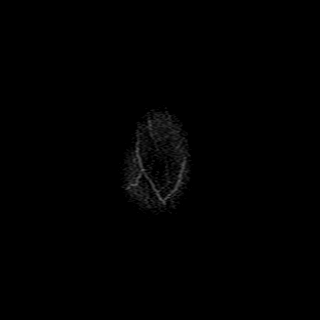

[Series 16: T1 · axial · 1.0mm · 0.98mm/px · z∈[-60,+112]mm · 12 of 176 slices shown (2 of 2)]
[im 1/176]
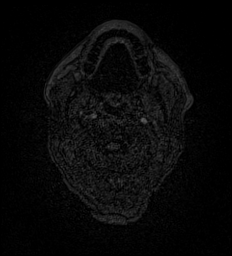
[im 16/176]
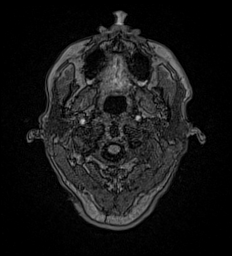
[im 32/176]
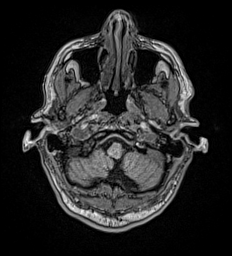
[im 48/176]
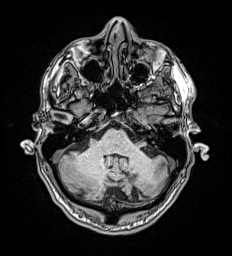
[im 64/176]
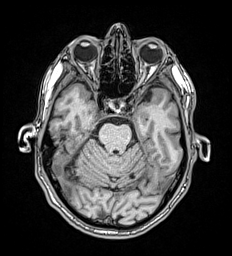
[im 80/176]
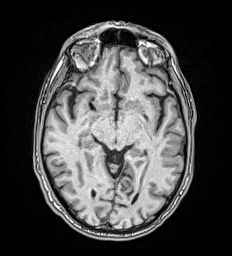
[im 96/176]
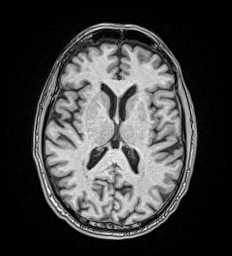
[im 112/176]
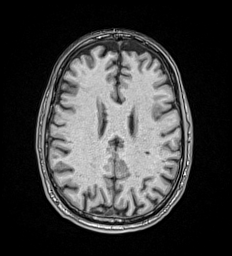
[im 128/176]
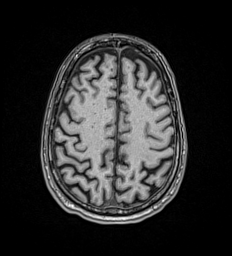
[im 144/176]
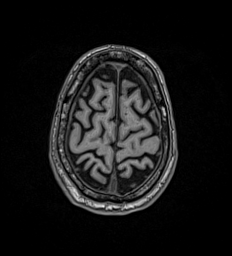
[im 160/176]
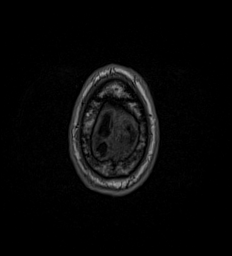
[im 176/176]
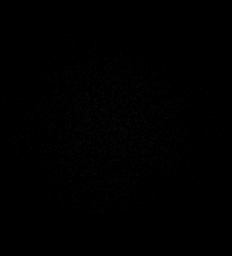

[Series 17: T2 · coronal · 5.0mm · 0.86mm/px · 2 of 32 slices shown (2 of 2)]
[im 1/32]
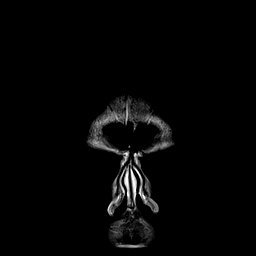
[im 32/32]
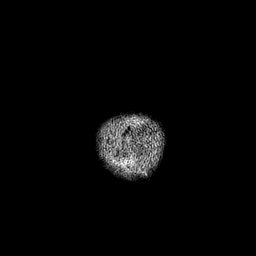

[48 of 48 positions shown; findings below may reference images not displayed]

FINDINGS: Brain:

No age-advanced or lobar predominant atrophy.

Subcentimeter chronic cortical infarct within the posteromedial left
frontal lobe (left ACA vascular territory) (series 15, images 44 and
45).

Mild multifocal T2 FLAIR hyperintense signal abnormality within the
cerebral white matter, nonspecific but compatible with chronic small
vessel ischemic disease.

Multiple chronic infarcts within the medial left cerebellar
hemisphere (with associated chronic hemosiderin deposition at these
sites).

Superimposed chronic microhemorrhage within the medial left
cerebellar hemisphere.

There is no acute infarct.

No evidence of an intracranial mass.

No extra-axial fluid collection.

No midline shift.

Vascular: Signal abnormality within portions of the intracranial
left vertebral artery suggesting high-grade-stenosis or vessel
occlusion (series 10, image 4). Flow voids preserved elsewhere
within the proximal large arterial vessels.

Skull and upper cervical spine: No focal suspicious marrow lesion.

Sinuses/Orbits: Visualized orbits show no acute finding. Trace
mucosal thickening within the bilateral ethmoid sinuses.

Impression #6 will be called to the ordering clinician or
representative by the Radiologist Assistant, and communication
documented in the PACS or [REDACTED].
IMPRESSION: No evidence of acute intracranial abnormality.

Subcentimeter chronic cortical infarct within the posteromedial left
frontal lobe (left ACA vascular territory).

Mild chronic small vessel ischemic changes within the cerebral white
matter.

Multiple chronic infarcts in the medial left cerebellar hemisphere
(with associated chronic hemosiderin deposition at these sites).

No age-advanced or lobar predominant atrophy.

Signal abnormality within portions of the intracranial left
vertebral artery suggesting high-grade stenosis or vessel occlusion.
MR or CT angiography may be obtained for further evaluation, as
clinically warranted.

## 2022-12-13 ENCOUNTER — Observation Stay (HOSPITAL_BASED_OUTPATIENT_CLINIC_OR_DEPARTMENT_OTHER)
Admit: 2022-12-13 | Discharge: 2022-12-13 | Disposition: A | Discharge status: Home / Self Care | Payer: No Typology Code available for payment source | Attending: Internal Medicine | Admitting: Internal Medicine

## 2022-12-13 ENCOUNTER — Emergency Department: Payer: No Typology Code available for payment source

## 2022-12-13 ENCOUNTER — Encounter: Payer: Self-pay | Admitting: Internal Medicine

## 2022-12-13 ENCOUNTER — Observation Stay
Admission: EM | Admit: 2022-12-13 | Discharge: 2022-12-14 | Disposition: A | Discharge status: Home / Self Care | Payer: No Typology Code available for payment source | Attending: Internal Medicine | Admitting: Internal Medicine

## 2022-12-13 ENCOUNTER — Observation Stay: Payer: No Typology Code available for payment source

## 2022-12-13 ENCOUNTER — Other Ambulatory Visit: Payer: Self-pay

## 2022-12-13 DIAGNOSIS — R0609 Other forms of dyspnea: Secondary | ICD-10-CM | POA: Insufficient documentation

## 2022-12-13 DIAGNOSIS — Z91010 Allergy to peanuts: Secondary | ICD-10-CM | POA: Diagnosis not present

## 2022-12-13 DIAGNOSIS — Z7982 Long term (current) use of aspirin: Secondary | ICD-10-CM | POA: Insufficient documentation

## 2022-12-13 DIAGNOSIS — R001 Bradycardia, unspecified: Secondary | ICD-10-CM

## 2022-12-13 DIAGNOSIS — Z1152 Encounter for screening for COVID-19: Secondary | ICD-10-CM | POA: Diagnosis not present

## 2022-12-13 DIAGNOSIS — Z79899 Other long term (current) drug therapy: Secondary | ICD-10-CM | POA: Insufficient documentation

## 2022-12-13 DIAGNOSIS — Z8673 Personal history of transient ischemic attack (TIA), and cerebral infarction without residual deficits: Secondary | ICD-10-CM

## 2022-12-13 DIAGNOSIS — F0390 Unspecified dementia without behavioral disturbance: Secondary | ICD-10-CM | POA: Diagnosis not present

## 2022-12-13 DIAGNOSIS — J45909 Unspecified asthma, uncomplicated: Secondary | ICD-10-CM | POA: Diagnosis not present

## 2022-12-13 DIAGNOSIS — F0280 Dementia in other diseases classified elsewhere without behavioral disturbance: Secondary | ICD-10-CM | POA: Insufficient documentation

## 2022-12-13 DIAGNOSIS — Z85828 Personal history of other malignant neoplasm of skin: Secondary | ICD-10-CM | POA: Diagnosis not present

## 2022-12-13 DIAGNOSIS — R55 Syncope and collapse: Principal | ICD-10-CM

## 2022-12-13 DIAGNOSIS — Z7902 Long term (current) use of antithrombotics/antiplatelets: Secondary | ICD-10-CM | POA: Insufficient documentation

## 2022-12-13 DIAGNOSIS — E785 Hyperlipidemia, unspecified: Secondary | ICD-10-CM | POA: Insufficient documentation

## 2022-12-13 LAB — RESP PANEL BY RT-PCR (RSV, FLU A&B, COVID)  RVPGX2
Influenza A by PCR: NEGATIVE
Influenza B by PCR: NEGATIVE
Resp Syncytial Virus by PCR: NEGATIVE
SARS Coronavirus 2 by RT PCR: NEGATIVE

## 2022-12-13 LAB — BRAIN NATRIURETIC PEPTIDE: B Natriuretic Peptide: 47.1 pg/mL (ref 0.0–100.0)

## 2022-12-13 LAB — BASIC METABOLIC PANEL
Anion gap: 8 (ref 5–15)
BUN: 22 mg/dL (ref 8–23)
CO2: 24 mmol/L (ref 22–32)
Calcium: 8.9 mg/dL (ref 8.9–10.3)
Chloride: 107 mmol/L (ref 98–111)
Creatinine, Ser: 0.76 mg/dL (ref 0.61–1.24)
GFR, Estimated: >60 mL/min (ref >60)
Glucose, Bld: 159 mg/dL — ABNORMAL HIGH (ref 70–99)
Potassium: 3.6 mmol/L (ref 3.5–5.1)
Sodium: 139 mmol/L (ref 135–145)

## 2022-12-13 LAB — CBC WITH DIFFERENTIAL/PLATELET
Abs Immature Granulocytes: 0.01 10*3/uL (ref 0.00–0.07)
Basophils Absolute: 0 10*3/uL (ref 0.0–0.1)
Basophils Relative: 1 %
Eosinophils Absolute: 0.1 10*3/uL (ref 0.0–0.5)
Eosinophils Relative: 2 %
HCT: 36.9 % — ABNORMAL LOW (ref 39.0–52.0)
Hemoglobin: 12.8 g/dL — ABNORMAL LOW (ref 13.0–17.0)
Immature Granulocytes: 0 %
Lymphocytes Relative: 29 %
Lymphs Abs: 1.3 10*3/uL (ref 0.7–4.0)
MCH: 35.9 pg — ABNORMAL HIGH (ref 26.0–34.0)
MCHC: 34.7 g/dL (ref 30.0–36.0)
MCV: 103.4 fL — ABNORMAL HIGH (ref 80.0–100.0)
Monocytes Absolute: 0.4 10*3/uL (ref 0.1–1.0)
Monocytes Relative: 9 %
Neutro Abs: 2.6 10*3/uL (ref 1.7–7.7)
Neutrophils Relative %: 59 %
Platelets: 133 10*3/uL — ABNORMAL LOW (ref 150–400)
RBC: 3.57 MIL/uL — ABNORMAL LOW (ref 4.22–5.81)
RDW: 12.4 % (ref 11.5–15.5)
WBC: 4.4 10*3/uL (ref 4.0–10.5)
nRBC: 0 % (ref 0.0–0.2)

## 2022-12-13 LAB — TROPONIN I (HIGH SENSITIVITY)
Troponin I (High Sensitivity): 4 ng/L (ref <18)
Troponin I (High Sensitivity): 4 ng/L (ref <18)

## 2022-12-13 LAB — URINALYSIS, ROUTINE W REFLEX MICROSCOPIC
Bacteria, UA: NONE SEEN
Bilirubin Urine: NEGATIVE
Glucose, UA: NEGATIVE mg/dL
Ketones, ur: NEGATIVE mg/dL
Leukocytes,Ua: NEGATIVE
Nitrite: NEGATIVE
Protein, ur: 30 mg/dL — AB
Specific Gravity, Urine: 1.024 (ref 1.005–1.030)
pH: 5 (ref 5.0–8.0)

## 2022-12-13 LAB — CBG MONITORING, ED: Glucose-Capillary: 149 mg/dL — ABNORMAL HIGH (ref 70–99)

## 2022-12-13 LAB — LACTIC ACID, PLASMA: Lactic Acid, Venous: 1.2 mmol/L (ref 0.5–1.9)

## 2022-12-13 LAB — PROCALCITONIN: Procalcitonin: <0.1 ng/mL

## 2022-12-13 LAB — VITAMIN B12: Vitamin B-12: 521 pg/mL (ref 180–914)

## 2022-12-13 MED ORDER — ATORVASTATIN CALCIUM 10 MG PO TABS
10.0000 mg | ORAL_TABLET | Freq: Every day | ORAL | Status: DC
  Administered 2022-12-13: 10 mg via ORAL
  Filled 2022-12-13: qty 1

## 2022-12-13 MED ORDER — SODIUM CHLORIDE 0.9% FLUSH
3.0000 mL | Freq: Two times a day (BID) | INTRAVENOUS | Status: DC
  Administered 2022-12-13 – 2022-12-14 (×3): 3 mL via INTRAVENOUS

## 2022-12-13 MED ORDER — TAMSULOSIN HCL 0.4 MG PO CAPS: 0.4000 mg | ORAL_CAPSULE | Freq: Every day | ORAL | Status: DC

## 2022-12-13 MED ORDER — SODIUM CHLORIDE 0.9 % IV BOLUS
1000.0000 mL | Freq: Once | INTRAVENOUS | Status: AC
  Administered 2022-12-13: 1000 mL via INTRAVENOUS

## 2022-12-13 MED ORDER — PANTOPRAZOLE SODIUM 40 MG PO TBEC
40.0000 mg | DELAYED_RELEASE_TABLET | Freq: Two times a day (BID) | ORAL | Status: DC
  Administered 2022-12-13 – 2022-12-14 (×2): 40 mg via ORAL
  Filled 2022-12-13 (×2): qty 1

## 2022-12-13 MED ORDER — FAMOTIDINE 20 MG PO TABS
20.0000 mg | ORAL_TABLET | Freq: Two times a day (BID) | ORAL | Status: DC
  Administered 2022-12-13 – 2022-12-14 (×2): 20 mg via ORAL
  Filled 2022-12-13 (×2): qty 1

## 2022-12-13 MED ORDER — CLOPIDOGREL BISULFATE 75 MG PO TABS
75.0000 mg | ORAL_TABLET | Freq: Every day | ORAL | Status: DC
  Administered 2022-12-14: 75 mg via ORAL
  Filled 2022-12-13: qty 1

## 2022-12-13 MED ORDER — HYDRALAZINE HCL 20 MG/ML IJ SOLN: 5.0000 mg | Freq: Four times a day (QID) | INTRAMUSCULAR | Status: DC | PRN

## 2022-12-13 MED ORDER — VITAMIN C 500 MG PO TABS
500.0000 mg | ORAL_TABLET | Freq: Every day | ORAL | Status: DC
  Administered 2022-12-13 – 2022-12-14 (×2): 500 mg via ORAL
  Filled 2022-12-13 (×2): qty 1

## 2022-12-13 MED ORDER — ACETAMINOPHEN 325 MG PO TABS: 650.0000 mg | ORAL_TABLET | Freq: Four times a day (QID) | ORAL | Status: DC | PRN

## 2022-12-13 MED ORDER — ACETAMINOPHEN 650 MG RE SUPP: 650.0000 mg | Freq: Four times a day (QID) | RECTAL | Status: DC | PRN

## 2022-12-13 MED ORDER — RIVASTIGMINE TARTRATE 3 MG PO CAPS
3.0000 mg | ORAL_CAPSULE | Freq: Two times a day (BID) | ORAL | Status: DC
  Administered 2022-12-13: 3 mg via ORAL
  Filled 2022-12-13 (×2): qty 1

## 2022-12-13 MED ORDER — ASPIRIN 81 MG PO TBEC
81.0000 mg | DELAYED_RELEASE_TABLET | Freq: Every day | ORAL | Status: DC
  Administered 2022-12-14: 81 mg via ORAL
  Filled 2022-12-13: qty 1

## 2022-12-13 MED ORDER — ALBUTEROL SULFATE (2.5 MG/3ML) 0.083% IN NEBU: 2.5000 mg | INHALATION_SOLUTION | Freq: Four times a day (QID) | RESPIRATORY_TRACT | Status: DC | PRN

## 2022-12-13 MED ORDER — GLYCOPYRROLATE 1 MG PO TABS
1.0000 mg | ORAL_TABLET | Freq: Every day | ORAL | Status: DC
  Administered 2022-12-14: 1 mg via ORAL
  Filled 2022-12-13: qty 1

## 2022-12-13 MED ORDER — ONDANSETRON HCL 4 MG PO TABS: 4.0000 mg | ORAL_TABLET | Freq: Four times a day (QID) | ORAL | Status: DC | PRN

## 2022-12-13 MED ORDER — ENOXAPARIN SODIUM 40 MG/0.4ML IJ SOSY
40.0000 mg | PREFILLED_SYRINGE | INTRAMUSCULAR | Status: DC
  Administered 2022-12-13: 40 mg via SUBCUTANEOUS
  Filled 2022-12-13: qty 0.4

## 2022-12-13 MED ORDER — SENNOSIDES-DOCUSATE SODIUM 8.6-50 MG PO TABS: 1.0000 | ORAL_TABLET | Freq: Every evening | ORAL | Status: DC | PRN

## 2022-12-13 MED ORDER — ONDANSETRON HCL 4 MG/2ML IJ SOLN: 4.0000 mg | Freq: Four times a day (QID) | INTRAMUSCULAR | Status: DC | PRN

## 2022-12-13 NOTE — Assessment & Plan Note (Signed)
-   Atorvastatin 10 mg nightly

## 2022-12-13 NOTE — Assessment & Plan Note (Signed)
-   Resumed home Plavix 75 mg daily, aspirin 81 mg daily - Atorvastatin 10 mg nightly

## 2022-12-13 NOTE — Hospital Course (Signed)
Roger Sanchez is a 77 year old male with history of [name], history of CVA, known bradycardia, who presents to the emergency department for chief concerns of altered mental status, near syncope from medical mall while waiting for his spouse who is getting outpatient imaging.  Initial vitals in the ED showed temperature of 98.1, respiration rate of 14, heart rate of 49, blood pressure 156/75, SpO2 of 95% on room air.  sNa is 139, potassium 3.6, chloride 107, bicarb 24, BUN of 22, serum creatinine of 0.76, EGFR greater than 60, nonfasting blood glucose 159, WBC 4.4, hemoglobin 12.8, platelets of 133.  CT head without contrast: Was read as atrophy and chronic small vessel ischemic changes.  Chronic left cerebellar CVA.  No acute intracranial process.  ED treatment: Sodium chloride 1 L bolus.

## 2022-12-13 NOTE — Consult Note (Signed)
Cardiology Consultation   Patient ID: Roger Sanchez MRN: [id number]; DOB: 1946/09/27  Admit date: 12/13/2022 Date of Consult: 12/13/2022  PCP:  Roger Sanchez, [place] Providers Cardiologist:  None        Patient Profile:   Roger Sanchez is a 77 y.o. male with a hx of [name], prior CVA, asthma, bradycardia, GERD, HOH, hyperlipidemia, BPH, chronic low back pain, osteopenia, who is being seen 12/13/2022 for the evaluation of dyspnea on exertion at the request of Dr. Tobie Sanchez.  History of Present Illness:   Roger Sanchez has a previously mentioned significant past medical history of body Lewy dementia, prior CVA, asthma, bradycardia, gastroesophageal reflux disease, hard of hearing, hyperlipidemia, BPH, chronic low back pain, and osteopenia.  He presented to the [facility] emergency department after a rapid response was called in the medical mall.  The patient was waiting for his wife who was having imaging done with radiology.  Per the wife he was not feeling well at that time.  He went downstairs had breakfast in the cafeteria came back up and was feeling worse.  Had 1 episode where he almost passed out, got very diaphoretic and pale, and his blood pressure was low.  Patient's wife also stated that he had 2 previous episodes of syncope/near syncope last week that improved whenever he sat down and drink some coffee.  Denied any recent falls or trauma.  He has previously been continued on Plavix due to history of CVA and TIA.  Denied any chest pain, cough, or shortness of breath. He endorses exhaustion with exertion on level walking surfaces.  He reported chest discomfort that radiates from one shoulder across his chest from left to right without any change in frequency or intensity that he states has been ongoing for several years.  If he gets to the top of the hill and starts, and downward walk the discomfort has resolved. Currently while in the emergency department he  is chest pain-free and denies any shortness of breath or dyspnea on exertion.  He does endorse issues with his memory  Initial vital signs: Blood pressure 152/104, pulse of 63, respirations of 18, temperature of 98.1  Pertinent labs: Blood glucose 159, RBC 3.57, hemoglobin 12.8, HCT 36.9, high-sensitivity troponin of 4 x 2, BNP of 47.1, respiratory panel negative  Medications administered in the emergency department: Normal saline 1 L, vitamin C 500 mg  Imaging: Chest x-ray reveals juxta diaphragmatic airspace process representing scarring or atelectasis no lobular consolidation or pleural effusion; CT of the head shows atrophy and chronic small vessel ischemic changes, chronic left cerebellar CVA, no acute intracranial process identified; MRI of the brain revealed no acute intracranial abnormality, mild chronic small vessel ischemic disease, chronic left cerebellar infarcts   Past Medical History:  Diagnosis Date   Arthralgia    shoulder    Asthma    Asymmetrical hearing loss    Atypical chest pain    Brachial plexus disorders    Bradycardia    Carpal tunnel syndrome    Cerebral infarction (HCC)    Closed fracture of wrist    Exposure to Agent Orange    GERD (gastroesophageal reflux disease)    HOH (hard of hearing)    bilateral   Hyperlipidemia    Hypertrophy of prostate    benign    Low back pain    Lumbar spine pain    compression fracture    Malignant neoplasm (HCC)  skin    Metatarsalgia    Osteopenia    Plantar fibromatosis    Stroke [facility]) 77 yrs old   no residual effects   Transient ischemic attack    Trigger finger     Past Surgical History:  Procedure Laterality Date   BACK SURGERY     BROW LIFT Bilateral 03/26/2021   Procedure: BLEPHAROPLASTY UPPER EYELID; W/EXCESS SKIN BROW PTOSIS REPAIR  BLEPHAROPTOSIS REPAIR; RESECT EX BILATERAL;  Surgeon: Karle Starch, MD;  Location: [place];  Service: Ophthalmology;  Laterality: Bilateral;   WRIST SURGERY  Right      Home Medications:  Prior to Admission medications   Medication Sig Start Date End Date Taking? Authorizing Provider  Albuterol Sulfate 108 (90 Base) MCG/ACT AEPB Inhale 2 puffs into the lungs QID.   Yes [provider]  ascorbic acid (VITAMIN C) 500 MG tablet Take 500 mg by mouth daily.   Yes [provider]  aspirin EC 81 MG tablet Take 81 mg by mouth daily.   Yes [provider]  atorvastatin (LIPITOR) 20 MG tablet Take 10 mg by mouth at bedtime.   Yes [provider]  clopidogrel (PLAVIX) 75 MG tablet Take 75 mg by mouth daily.   Yes [provider]  famotidine (PEPCID) 20 MG tablet Take 20 mg by mouth 2 (two) times daily.   Yes [provider]  glycopyrrolate (ROBINUL) 1 MG tablet Take 1 mg by mouth daily at 6 (six) AM.   Yes [provider]  Multiple Vitamins-Minerals (MULTI-VITAMIN/MINERALS PO) Take by mouth.   Yes [provider]  naproxen (NAPROSYN) 500 MG tablet Take 500 mg by mouth 2 (two) times daily with a meal.   Yes [provider]  pantoprazole (PROTONIX) 40 MG tablet Take 40 mg by mouth 2 (two) times daily.   Yes [provider]  tamsulosin (FLOMAX) 0.4 MG CAPS capsule Take 0.4 mg by mouth.   Yes [provider]  albuterol (PROVENTIL) (2.5 MG/3ML) 0.083% nebulizer solution Take 2.5 mg by nebulization every 6 (six) hours as needed for wheezing or shortness of breath. Patient not taking: Reported on 03/26/2021    [provider]  clotrimazole (LOTRIMIN) 1 % cream Apply 1 application topically 2 (two) times daily. Patient not taking: Reported on 03/26/2021    [provider]  cyclobenzaprine (FLEXERIL) 10 MG tablet Take 10 mg by mouth at bedtime. As needed Patient not taking: Reported on 03/26/2021    [provider]  diclofenac sodium (VOLTAREN) 1 % GEL Apply topically 4 (four) times daily. Patient not taking: Reported on 03/26/2021    [provider]  erythromycin ophthalmic ointment Apply to sutures 4 times a day for 10-12 days.  Discontinue if allergy develops and call our office 03/26/21   Karle Starch, MD  erythromycin ophthalmic ointment Apply to sutures 4 times a day for 10-12 days.  Discontinue if allergy develops and call our office 03/26/21   Karle Starch, MD  GUAIFENESIN CR PO Take by mouth.    [provider]  ranitidine (ZANTAC) 150 MG capsule Take 150 mg by mouth 2 (two) times daily. Patient not taking: Reported on 03/26/2021    [provider]  sertraline (ZOLOFT) 25 MG tablet Take 25 mg by mouth daily. Patient not taking: Reported on 03/26/2021    [provider]  sildenafil (VIAGRA) 100 MG tablet Take 100 mg by mouth daily as needed for erectile dysfunction.    [provider]  traMADol (ULTRAM) 50 MG tablet Take 1 every 4-6 hours as needed for pain not controlled by Tylenol Patient not taking: Reported on 12/13/2022 03/26/21   Karle Starch, MD    Inpatient Medications: Scheduled Meds:  ascorbic acid  500 mg Oral Daily   [START ON 12/14/2022] aspirin EC  81 mg Oral Daily   atorvastatin  10 mg Oral QHS   [START ON 12/14/2022] clopidogrel  75 mg Oral Daily   enoxaparin (LOVENOX) injection  40 mg Subcutaneous Q24H   famotidine  20 mg Oral BID   pantoprazole  40 mg Oral BID   sodium chloride flush  3 mL Intravenous Q12H   Continuous Infusions:  PRN Meds: acetaminophen **OR** acetaminophen, albuterol, hydrALAZINE, ondansetron **OR** ondansetron (ZOFRAN) IV, senna-docusate  Allergies:    Allergies  Allergen Reactions   Barley Grass    Corn-Containing Products    Malt    Milk-Related Compounds    Peanut-Containing Drug Products    Wheat Bran    Zocor [Simvastatin] Other (See Comments)    Muscle aches    Social History:   Social History   Socioeconomic History   Marital status: Married    Spouse name: Not on file   Number of children: Not on file   Years of  education: Not on file   Highest education level: Not on file  Occupational History   Not on file  Tobacco Use   Smoking status: Never   Smokeless tobacco: Never  Substance and Sexual Activity   Alcohol use: Never   Drug use: Never   Sexual activity: Not Currently  Other Topics Concern   Not on file  Social History Narrative   Not on file   Social Determinants of Health   Financial Resource Strain: Not on file  Food Insecurity: Not on file  Transportation Needs: Not on file  Physical Activity: Not on file  Stress: Not on file  Social Connections: Not on file  Intimate Partner Violence: Not on file    Family History:    Family History  Problem Relation Age of Onset   Heart Problems Father    Heart attack Paternal Aunt    Heart attack Paternal Uncle      ROS:  Please see the history of present illness.  Review of Systems  Constitutional:  Positive for malaise/fatigue.  Psychiatric/Behavioral:  Positive for memory loss.     All other ROS reviewed and negative.     Physical Exam/Data:   Vitals:   12/13/22 1200 12/13/22 1312 12/13/22 1314 12/13/22 1430  BP: (!) 152/61 (!) 147/65 (!) 147/64 (!) 154/61  Pulse: (!) 40 (!) 47 61 (!) 41  Resp: '16 17 17 15  '$ Temp:      TempSrc:      SpO2: 100% 99% 100% 100%  Weight:      Height:        Intake/Output Summary (Last 24 hours) at 12/13/2022 1546 Last data filed at 12/13/2022 1240 Gross per 24 hour  Intake 1000 ml  Output --  Net 1000 ml      12/13/2022    9:27 AM 03/26/2021    8:06 AM 03/18/2021    3:50 PM  Last 3 Weights  Weight (lbs) 178 lb 175 lb 175 lb  Weight (kg) 80.74 kg 79.379 kg 79.379 kg     Body mass index is 27.88 kg/m.  General:  Well nourished, well developed, in no acute distress HEENT: normal, glasses are on, [facility]  Neck: no JVD Vascular: No carotid bruits; Distal pulses 2+ bilaterally Cardiac:  normal S1, S2; RRR; bradycardic, II/VI systolic murmur best heard at the RSB that does not radiate into  the bilateral carotids  Lungs:  clear to auscultation bilaterally, no wheezing, rhonchi or rales, respirations are unlabored at rest on room air Abd: soft, nontender, no hepatomegaly  Ext: no edema Musculoskeletal:  No deformities, BUE and BLE strength normal and equal Skin: warm and dry  Neuro:  CNs 2-12 intact, no focal abnormalities noted Psych:  Normal affect   EKG:  The EKG was personally reviewed and demonstrates: Sinus bradycardia with a rate of 58, prescribed block and LVH Telemetry:  Telemetry was personally reviewed and demonstrates: Sinus bradycardia with first-degree AV block rates 40-50 with unifocal PVCs occasionally  Relevant CV Studies: TTE 12/28/18 1. The left ventricle has normal systolic function, with an ejection  fraction of 55-60%. The cavity size was normal. Left ventricular diastolic  Doppler parameters are consistent with impaired relaxation.   2. The right ventricle has normal systolic function. The cavity was  normal. There is no increase in right ventricular wall thickness. Right  ventricular systolic pressure is mildly elevated with an estimated  pressure of 42.7 mmHg.   3. The aortic valve is normal in structure. Severe calcifcation of the  aortic valve. Mild stenosis of the aortic valve. Mean gradient of 11 mm  Hg.   Laboratory Data:  High Sensitivity Troponin:   Recent Labs  Lab 12/13/22 0955 12/13/22 1248  TROPONINIHS 4 4     Chemistry Recent Labs  Lab 12/13/22 0955  NA 139  K 3.6  CL 107  CO2 24  GLUCOSE 159*  BUN 22  CREATININE 0.76  CALCIUM 8.9  GFRNONAA >60  ANIONGAP 8    No results for input(s): "PROT", "ALBUMIN", "AST", "ALT", "ALKPHOS", "BILITOT" in the last 168 hours. Lipids No results for input(s): "CHOL", "TRIG", "HDL", "LABVLDL", "LDLCALC", "CHOLHDL" in the last 168 hours.  Hematology Recent Labs  Lab 12/13/22 0955  WBC 4.4  RBC 3.57*  HGB 12.8*  HCT 36.9*  MCV 103.4*  MCH 35.9*  MCHC 34.7  RDW 12.4  PLT 133*    Thyroid No results for input(s): "TSH", "FREET4" in the last 168 hours.  BNP Recent Labs  Lab 12/13/22 1008  BNP 47.1    DDimer No results for input(s): "DDIMER" in the last 168 hours.   Radiology/Studies:  MR BRAIN WO CONTRAST  Result Date: 12/13/2022 CLINICAL DATA:  Transient ischemic attack (TIA). Neuro deficit, acute, stroke suspected. EXAM: MRI HEAD WITHOUT CONTRAST TECHNIQUE: Multiplanar, multiecho pulse sequences of the brain and surrounding structures were obtained without intravenous contrast. COMPARISON:  Head CT 12/13/2022 and MRI 02/02/2022 FINDINGS: Brain: There is no evidence of an acute infarct, mass, midline shift, or extra-axial fluid collection. Small T2 hyperintensities in the cerebral white matter bilaterally are unchanged from the prior MRI and are nonspecific but compatible with mild chronic small vessel ischemic disease. Chronic medial left cerebellar infarcts are unchanged from the prior MRI, with associated hemosiderin deposition again noted. Mild cerebral atrophy is within normal limits for age. Vascular: Unchanged abnormal appearance of the distal left vertebral artery likely reflecting chronic occlusion. Other major intracranial vascular flow voids are preserved. Skull and upper cervical spine: Unremarkable bone marrow signal. Sinuses/Orbits: Unremarkable orbits. Paranasal sinuses and mastoid air cells are clear. Other: None. IMPRESSION: 1. No acute intracranial abnormality. 2. Mild chronic small vessel ischemic disease. 3. Chronic left cerebellar infarcts. Electronically  Signed   By: Logan Bores M.D.   On: 12/13/2022 15:42   CT Head Wo Contrast  Result Date: 12/13/2022 CLINICAL DATA:  Mental status change, unknown cause EXAM: CT HEAD WITHOUT CONTRAST TECHNIQUE: Contiguous axial images were obtained from the base of the skull through the vertex without intravenous contrast. RADIATION DOSE REDUCTION: This exam was performed according to the departmental  dose-optimization program which includes automated exposure control, adjustment of the mA and/or kV according to patient size and/or use of iterative reconstruction technique. COMPARISON:  None Available. FINDINGS: Brain: There is periventricular white matter decreased attenuation consistent with small vessel ischemic changes. Ventricles, sulci and cisterns are prominent consistent with age related involutional changes. No acute intracranial hemorrhage, mass effect or shift. No hydrocephalus. Encephalomalacia consistent with a chronic left-sided cerebellar CVA. Vascular: No hyperdense vessel or unexpected calcification. Skull: Normal. Negative for fracture or focal lesion. Sinuses/Orbits: No acute finding. IMPRESSION: Atrophy and chronic small vessel ischemic changes. Chronic left cerebellar CVA. No acute intracranial process identified. Electronically Signed   By: Sammie Bench M.D.   On: 12/13/2022 11:06   DG Chest 2 View  Result Date: 12/13/2022 CLINICAL DATA:  77 year old male presents for evaluation of syncope and cough. EXAM: CHEST - 2 VIEW COMPARISON:  None available. FINDINGS: EKG leads project over the chest. Trachea midline. Cardiomediastinal contours and hilar structures are stable. Minimal juxta diaphragmatic airspace disease on the LEFT. No lobar consolidation. No pneumothorax. No sign of pleural effusion. On limited assessment there is no acute skeletal process. IMPRESSION: Juxta diaphragmatic airspace process may represent scarring or atelectasis. Correlate with any signs of infection. No lobar consolidation or pleural effusion. Electronically Signed   By: Zetta Bills M.D.   On: 12/13/2022 10:39     Assessment and Plan:   Dyspnea on exertion and atypical chest pressure for several years -Currently chest pain-free and denies shortness of breath -High-sensitivity troponin negative x 2 -With longstanding chest discomfort if echocardiogram does not reveal severe valvular abnormalities can  consider nuclear stress testing  -EKG as needed for pain or changes -Monitor oxygen saturations with vitals -BNP 47.1 -Respiratory panel negative  Near syncopal episode -Echocardiogram ordered and pending -Orthostatic vitals -CT of the head and MRI of the brain unchanged, no acute intercranial findings -Fall precautions  Sinus bradycardia -Asymptomatic -Appropriate heart rate response with movement and activity -Continue on telemetry monitoring  Heart murmur -Patient states he has been told that he has had a heart murmur in the past -Echocardiogram ordered and pending to rule out valvular abnormalities  Hyperlipidemia -Continued on atorvastatin  History of CVA -CT of the head and MRI of the brain without any acute intercranial changes -Continued on aspirin and clopidogrel  [name] -Continue to [facility] at the [facility]  Risk Assessment/Risk Scores:                For questions or updates, please contact [place] Please consult [email or web address] for contact info under    Signed, Nashton Belson, NP  12/13/2022 3:46 PM

## 2022-12-13 NOTE — ED Notes (Signed)
Rapid Response note:  RN responded to call from [facility] at approximately 0915 about patient in the radiology waiting room who was diaphoretic and pale. Pt wife reports that was not feeling well this morning and was feeling shaky. Pt wife reports when she came out from getting her MRI pt was diaphoretic and pale. Pt wife also reported that pt had 2 recent syncopal episodes.   Pts CBG was taken by this RN and was 149. Pt brought to the ED for further evaluation by EDP.

## 2022-12-13 NOTE — Assessment & Plan Note (Addendum)
-   Etiology workup in progress at this time, differentials include acute CVA, B12 deficiency, atypical angina, versus high clinical suspicion of progression of [name] given the presence of supportive clinical features including syncope, transient episodes of unresponsiveness, repeated falls, severe autonomic dysfunction - MRI of the brain without contrast ordered, check B12 serum level (add to prior collection, discussed with wireless lab technician) - Check orthostatic vital signs on admission, procalcitonin - If procalcitonin is positive, we will initiate antibiotic for community-acquired pneumonia - Cardiology consulted - Fall precautions, aspiration precautions

## 2022-12-13 NOTE — ED Notes (Signed)
Pt stood at bedside to provide urine sample. HR went up to 61. Pt tolerated well.

## 2022-12-13 NOTE — Assessment & Plan Note (Addendum)
-   Resumed home rivastigmine 3 mg p.o. twice daily, resumed - Patient follows with [facility] neurology, recommended follow-up on discharge

## 2022-12-13 NOTE — Progress Notes (Signed)
  Chaplain On-Call responded to Rapid Response notification at 0918 hours to the [facility] in the [place].  Imaging Staff stated that the patient was taken to the Emergency Department after becoming pale and shaky while waiting for his wife to complete an MRI test.  Chaplain locate the patient in ED-19. He was receiving bedside care from the ED team.  Chaplain assured Staff of availability as needed.  Chaplain Pollyann Samples M.Div., [facility]

## 2022-12-13 NOTE — H&P (Addendum)
History and Physical   Roger Sanchez JJO:[id number] DOB: 24-Jan-1946 DOA: 12/13/2022  PCP: Roger Mccreedy, MD Outpatient Specialists: Dr. Manuella Sanchez, neurology Patient coming from: [place]  I have personally briefly reviewed patient's old medical records in [place].  Chief Concern: Altered mental status, near syncope  HPI: Mr. Roger Sanchez is a 77 year old male with history of [name], history of CVA, known bradycardia, who presents to the emergency department for chief concerns of altered mental status, near syncope from medical mall while waiting for his spouse who is getting outpatient imaging.  Initial vitals in the ED showed temperature of 98.1, respiration rate of 14, heart rate of 49, blood pressure 156/75, SpO2 of 95% on room air.  sNa is 139, potassium 3.6, chloride 107, bicarb 24, BUN of 22, serum creatinine of 0.76, EGFR greater than 60, nonfasting blood glucose 159, WBC 4.4, hemoglobin 12.8, platelets of 133.  CT head without contrast: Was read as atrophy and chronic small vessel ischemic changes.  Chronic left cerebellar CVA.  No acute intracranial process.  ED treatment: Sodium chloride 1 L bolus. ------------------ At bedside, he is able to tell me his name, age current location, current calendar year. He is able to identify his spouse, Roger Sanchez, at bedside.   Spouse at bedside, reports that over the last week, patient told his wife that he felt like he was going to black out. This happened today in the [place]. He reprots that he did not fully black out, loose consciousness, or hit is head in all three episodes.   He denies chest pain, shortness of breath, dysuria, diarrhea, changes to appetite, fever, cough.   He and his spouse reports that at baseline, he walks about 3-5 miles throughout each day. He reports that over the last 2 weeks this has become increasingly difficult for him.  He endorses that this new exhaustion with exertion on level  walking surface started about 2 weeks ago.  His spouse reports that he would come in from walking outside telling her that he wished he could have walked more but feels too tired.  He states that the tiredness is not from muscle ache or muscle fatigue in his legs.  He states the exhaustion and feelings of tiredness comes from the chest area.  He reports that walking upstairs causes increased dyspnea and chest pain.  He reports the chest pain with walking upstairs or uphill started about 6 months to 1-year ago.  He denies swelling of his lower extremities.  He reports that currently he does not have chest pain or shortness of breath.  Social history: He lives at home with his spouse of 4 years. He denies tobacco use.  ROS: Constitutional: no weight change, no fever ENT/Mouth: no sore throat, no rhinorrhea Eyes: no eye pain, no vision changes Cardiovascular: + chest pain, + dyspnea,  no edema, no palpitations Respiratory: no cough, no sputum, no wheezing Gastrointestinal: no nausea, no vomiting, no diarrhea, no constipation Genitourinary: no urinary incontinence, no dysuria, no hematuria Musculoskeletal: no arthralgias, no myalgias Skin: no skin lesions, no pruritus, Neuro: + weakness, no loss of consciousness, no syncope Psych: no anxiety, no depression, no decrease appetite Heme/Lymph: no bruising, no bleeding  ED Course: Discussed with emergency medicine provider, patient requiring hospitalization for chief concerns of near syncope.  Assessment/Plan  Principal Problem:   Near syncope Active Problems:   [name] (HCC)   Sinus bradycardia   History of CVA (cerebrovascular accident)   Hyperlipidemia  Dyspnea on exertion   Assessment and Plan:  * Near syncope - Etiology workup in progress at this time, differentials include acute CVA, B12 deficiency, atypical angina, versus high clinical suspicion of progression of [name] given the presence of supportive  clinical features including syncope, transient episodes of unresponsiveness, repeated falls, severe autonomic dysfunction - MRI of the brain without contrast ordered, check B12 serum level (add to prior collection, discussed with wireless lab technician) - Check orthostatic vital signs on admission, procalcitonin - If procalcitonin is positive, we will initiate antibiotic for community-acquired pneumonia - Cardiology consulted - Fall precautions, aspiration precautions  Dyspnea on exertion With chest pain on exertion, cardiac etiology cannot be excluded at this time - Complete echo ordered - Cardiology consulted, Dr. Saunders Revel via secure chat and epic order for consideration of cardiac stress test  Hyperlipidemia - Atorvastatin 10 mg nightly  History of CVA (cerebrovascular accident) - Resumed home Plavix 75 mg daily, aspirin 81 mg daily - Atorvastatin 10 mg nightly  Sinus bradycardia - Known bradycardia, outpatient visit on 06/30/2022 had a heart rate of 61  [name] (Roger Sanchez) - Resumed home rivastigmine 3 mg p.o. twice daily, resumed - Patient follows with [facility], recommended follow-up on discharge  Chart reviewed.   DVT prophylaxis: Enoxaparin 40 mg subcutaneous Code Status: Full code Diet: Heart healthy Family Communication: Updated this, Roger Sanchez at bedside with patient's permission Disposition Plan: Pending MRI and serum B12 Consults called: Cardiology service, [facility] cardiology Admission status: Telemetry medical, observation  Past Medical History:  Diagnosis Date   Arthralgia    shoulder    Asthma    Asymmetrical hearing loss    Atypical chest pain    Brachial plexus disorders    Bradycardia    Carpal tunnel syndrome    Cerebral infarction ([place])    Closed fracture of wrist    Exposure to Agent Orange    GERD (gastroesophageal reflux disease)    HOH (hard of hearing)    bilateral   Hyperlipidemia    Hypertrophy of prostate    benign    Low back pain     Lumbar spine pain    compression fracture    Malignant neoplasm ([place])    skin    Metatarsalgia    Osteopenia    Plantar fibromatosis    Stroke ([place]) 77 yrs old   no residual effects   Transient ischemic attack    Trigger finger    Past Surgical History:  Procedure Laterality Date   BACK SURGERY     BROW LIFT Bilateral 03/26/2021   Procedure: BLEPHAROPLASTY UPPER EYELID; W/EXCESS SKIN BROW PTOSIS REPAIR  BLEPHAROPTOSIS REPAIR; RESECT EX BILATERAL;  Surgeon: Karle Starch, MD;  Location: [place];  Service: Ophthalmology;  Laterality: Bilateral;   WRIST SURGERY Right    Social History:  reports that he has never smoked. He has never used smokeless tobacco. He reports that he does not drink alcohol and does not use drugs.  Allergies  Allergen Reactions   Barley Grass    Corn-Containing Products    Malt    Milk-Related Compounds    Peanut-Containing Drug Products    Wheat Bran    Zocor [Simvastatin] Other (See Comments)    Muscle aches   Family History  Problem Relation Age of Onset   Heart Problems Father    Heart attack Paternal Aunt    Heart attack Paternal Uncle    Family history: Family history reviewed and not pertinent  Prior to Admission medications   Medication Sig Start Date End Date Taking? Authorizing Provider  Albuterol Sulfate 108 (90 Base) MCG/ACT AEPB Inhale 2 puffs into the lungs QID.   Yes [provider]  ascorbic acid (VITAMIN C) 500 MG tablet Take 500 mg by mouth daily.   Yes [provider]  aspirin EC 81 MG tablet Take 81 mg by mouth daily.   Yes [provider]  atorvastatin (LIPITOR) 20 MG tablet Take 10 mg by mouth at bedtime.   Yes [provider]  clopidogrel (PLAVIX) 75 MG tablet Take 75 mg by mouth daily.   Yes [provider]  famotidine (PEPCID) 20 MG tablet Take 20 mg by mouth 2 (two) times daily.   Yes [provider]  glycopyrrolate (ROBINUL) 1 MG tablet Take 1 mg by mouth  daily at 6 (six) AM.   Yes [provider]  Multiple Vitamins-Minerals (MULTI-VITAMIN/MINERALS PO) Take by mouth.   Yes [provider]  naproxen (NAPROSYN) 500 MG tablet Take 500 mg by mouth 2 (two) times daily with a meal.   Yes [provider]  pantoprazole (PROTONIX) 40 MG tablet Take 40 mg by mouth 2 (two) times daily.   Yes [provider]  tamsulosin (FLOMAX) 0.4 MG CAPS capsule Take 0.4 mg by mouth.   Yes [provider]  albuterol (PROVENTIL) (2.5 MG/3ML) 0.083% nebulizer solution Take 2.5 mg by nebulization every 6 (six) hours as needed for wheezing or shortness of breath. Patient not taking: Reported on 03/26/2021    [provider]  clotrimazole (LOTRIMIN) 1 % cream Apply 1 application topically 2 (two) times daily. Patient not taking: Reported on 03/26/2021    [provider]  cyclobenzaprine (FLEXERIL) 10 MG tablet Take 10 mg by mouth at bedtime. As needed Patient not taking: Reported on 03/26/2021    [provider]  diclofenac sodium (VOLTAREN) 1 % GEL Apply topically 4 (four) times daily. Patient not taking: Reported on 03/26/2021    [provider]  erythromycin ophthalmic ointment Apply to sutures 4 times a day for 10-12 days.  Discontinue if allergy develops and call our office 03/26/21   Karle Starch, MD  erythromycin ophthalmic ointment Apply to sutures 4 times a day for 10-12 days.  Discontinue if allergy develops and call our office 03/26/21   Karle Starch, MD  GUAIFENESIN CR PO Take by mouth.    [provider]  ranitidine (ZANTAC) 150 MG capsule Take 150 mg by mouth 2 (two) times daily. Patient not taking: Reported on 03/26/2021    [provider]  sertraline (ZOLOFT) 25 MG tablet Take 25 mg by mouth daily. Patient not taking: Reported on 03/26/2021    [provider]  sildenafil (VIAGRA) 100 MG tablet Take 100 mg by mouth daily as needed for erectile dysfunction.     [provider]  traMADol (ULTRAM) 50 MG tablet Take 1 every 4-6 hours as needed for pain not controlled by Tylenol Patient not taking: Reported on 12/13/2022 03/26/21   Karle Starch, MD   Physical Exam: Vitals:   12/13/22 1314 12/13/22 1430 12/13/22 1500 12/13/22 1638  BP: (!) 147/64 (!) 154/61 132/68 (!) 154/65  Pulse: 61 (!) 41 (!) 45 (!) 52  Resp: '17 15 16 15  '$ Temp:    (!) 97.5 F (36.4 C)  TempSrc:    Oral  SpO2: 100% 100% 100% 100%  Weight:    80 kg  Height:  $'5\' 6"'A$  (1.676 m)   Constitutional: appears age appropriate, NAD, calm, comfortable Eyes: PERRL, lids and conjunctivae normal ENMT: Mucous membranes are moist. Posterior pharynx clear of any exudate or lesions. Age-appropriate dentition. Hearing appropriate Neck: normal, supple, no masses, no thyromegaly Respiratory: clear to auscultation bilaterally, no wheezing, no crackles. Normal respiratory effort. No accessory muscle use.  Cardiovascular: Regular rate and rhythm, no murmurs / rubs / gallops. No extremity edema. 2+ pedal pulses. No carotid bruits.  Abdomen: no tenderness, no masses palpated, no hepatosplenomegaly. Bowel sounds positive.  Musculoskeletal: no clubbing / cyanosis. No joint deformity upper and lower extremities. Good ROM, no contractures, no atrophy. Normal muscle tone.  Skin: no rashes, lesions, ulcers. No induration Neurologic: Sensation intact. Strength 5/5 in all 4.  Psychiatric: Normal judgment and insight. Alert and oriented x 3. Normal mood.   EKG: independently reviewed, showing sinus bradycardia with rate of 58, QTc 439  Chest x-ray on Admission: I personally reviewed and I agree with radiologist reading as below.  MR BRAIN WO CONTRAST  Result Date: 12/13/2022 CLINICAL DATA:  Transient ischemic attack (TIA). Neuro deficit, acute, stroke suspected. EXAM: MRI HEAD WITHOUT CONTRAST TECHNIQUE: Multiplanar, multiecho pulse sequences of the brain and surrounding structures were obtained without  intravenous contrast. COMPARISON:  Head CT 12/13/2022 and MRI 02/02/2022 FINDINGS: Brain: There is no evidence of an acute infarct, mass, midline shift, or extra-axial fluid collection. Small T2 hyperintensities in the cerebral white matter bilaterally are unchanged from the prior MRI and are nonspecific but compatible with mild chronic small vessel ischemic disease. Chronic medial left cerebellar infarcts are unchanged from the prior MRI, with associated hemosiderin deposition again noted. Mild cerebral atrophy is within normal limits for age. Vascular: Unchanged abnormal appearance of the distal left vertebral artery likely reflecting chronic occlusion. Other major intracranial vascular flow voids are preserved. Skull and upper cervical spine: Unremarkable bone marrow signal. Sinuses/Orbits: Unremarkable orbits. Paranasal sinuses and mastoid air cells are clear. Other: None. IMPRESSION: 1. No acute intracranial abnormality. 2. Mild chronic small vessel ischemic disease. 3. Chronic left cerebellar infarcts. Electronically Signed   By: Logan Bores M.D.   On: 12/13/2022 15:42   CT Head Wo Contrast  Result Date: 12/13/2022 CLINICAL DATA:  Mental status change, unknown cause EXAM: CT HEAD WITHOUT CONTRAST TECHNIQUE: Contiguous axial images were obtained from the base of the skull through the vertex without intravenous contrast. RADIATION DOSE REDUCTION: This exam was performed according to the departmental dose-optimization program which includes automated exposure control, adjustment of the mA and/or kV according to patient size and/or use of iterative reconstruction technique. COMPARISON:  None Available. FINDINGS: Brain: There is periventricular white matter decreased attenuation consistent with small vessel ischemic changes. Ventricles, sulci and cisterns are prominent consistent with age related involutional changes. No acute intracranial hemorrhage, mass effect or shift. No hydrocephalus. Encephalomalacia  consistent with a chronic left-sided cerebellar CVA. Vascular: No hyperdense vessel or unexpected calcification. Skull: Normal. Negative for fracture or focal lesion. Sinuses/Orbits: No acute finding. IMPRESSION: Atrophy and chronic small vessel ischemic changes. Chronic left cerebellar CVA. No acute intracranial process identified. Electronically Signed   By: Sammie Bench M.D.   On: 12/13/2022 11:06   DG Chest 2 View  Result Date: 12/13/2022 CLINICAL DATA:  78 year old male presents for evaluation of syncope and cough. EXAM: CHEST - 2 VIEW COMPARISON:  None available. FINDINGS: EKG leads project over the chest. Trachea midline. Cardiomediastinal contours and hilar structures are stable. Minimal juxta diaphragmatic airspace disease on the LEFT.  No lobar consolidation. No pneumothorax. No sign of pleural effusion. On limited assessment there is no acute skeletal process. IMPRESSION: Juxta diaphragmatic airspace process may represent scarring or atelectasis. Correlate with any signs of infection. No lobar consolidation or pleural effusion. Electronically Signed   By: Zetta Bills M.D.   On: 12/13/2022 10:39    Labs on Admission: I have personally reviewed following labs  CBC: Recent Labs  Lab 12/13/22 0955  WBC 4.4  NEUTROABS 2.6  HGB 12.8*  HCT 36.9*  MCV 103.4*  PLT 975*   Basic Metabolic Panel: Recent Labs  Lab 12/13/22 0955  NA 139  K 3.6  CL 107  CO2 24  GLUCOSE 159*  BUN 22  CREATININE 0.76  CALCIUM 8.9   GFR: Estimated Creatinine Clearance: 79.3 mL/min (by C-G formula based on SCr of 0.76 mg/dL).  CBG: Recent Labs  Lab 12/13/22 0918  GLUCAP 149*   Urine analysis:    Component Value Date/Time   COLORURINE YELLOW (A) 12/13/2022 1240   APPEARANCEUR HAZY (A) 12/13/2022 1240   LABSPEC 1.024 12/13/2022 1240   PHURINE 5.0 12/13/2022 1240   GLUCOSEU NEGATIVE 12/13/2022 1240   HGBUR SMALL (A) 12/13/2022 1240   BILIRUBINUR NEGATIVE 12/13/2022 1240   KETONESUR  NEGATIVE 12/13/2022 1240   PROTEINUR 30 (A) 12/13/2022 1240   NITRITE NEGATIVE 12/13/2022 1240   LEUKOCYTESUR NEGATIVE 12/13/2022 1240   This document was prepared using Dragon Voice Recognition software and may include unintentional dictation errors.  Dr. Tobie Poet [facility]  If 7PM-7AM, please contact overnight-coverage provider If 7AM-7PM, please contact day coverage provider [email or web address]  12/13/2022, 5:38 PM

## 2022-12-13 NOTE — ED Triage Notes (Signed)
Pt here to pick up wife from MRI when he became diaphoretic, pale, and shaky. Pt has hx of dementia. Wife states last week pt bp was low and he had several episodes of syncope but did not fully pass out or hit his head. Hx of bradycardia.

## 2022-12-13 NOTE — Assessment & Plan Note (Signed)
-   Known bradycardia, outpatient visit on 06/30/2022 had a heart rate of 61

## 2022-12-13 NOTE — ED Notes (Signed)
First Nurse Note: Pt was a rapid response from the medical mall. Per charge, pt CBG was 106. Pt has a hx of lewy body dementia. Wife at bedside.

## 2022-12-13 NOTE — ED Provider Notes (Signed)
[facility] Provider Note    Event Date/Time   First MD Initiated Contact with Patient 12/13/22 0940     (approximate)   History   Near Syncope   HPI  Roger Sanchez is a 77 y.o. male past medical history significant for [name], prior CVA, who presents to the emergency department following an episode of altered mental status.  History is provided by the patient's wife at bedside.  Patient was waiting for his wife who was getting imaging done with radiology.  States that this morning he was not feeling well at time of waiting for her.  Went downstairs and ate breakfast and came back up and was feeling worse.  Had an episode where he almost passed out, got very diaphoretic, his blood pressure was low at that time.  Patient's wife stated that he had 2 episodes of syncope last week that improved whenever he sat down and drink some coffee.  Denies any recent falls or trauma.  Does state that he takes Plavix.  Patient denies any chest pain.  New medication for drooling that was started 1 month ago.  Denies any cough or shortness of breath.  Denies abdominal pain nausea vomiting or diarrhea.  Denies any blood in his stool.  Denies any dysuria, urinary urgency or frequency.     Physical Exam   Triage Vital Signs: ED Triage Vitals  Enc Vitals Group     BP 12/13/22 0926 (!) 152/104     Pulse Rate 12/13/22 0926 63     Resp 12/13/22 0926 18     Temp 12/13/22 0926 98.1 F (36.7 C)     Temp Source 12/13/22 0926 Oral     SpO2 12/13/22 0926 97 %     Weight 12/13/22 0927 178 lb (80.7 kg)     Height 12/13/22 0927 '5\' 7"'$  (1.702 m)     Head Circumference --      Peak Flow --      Pain Score 12/13/22 0927 0     Pain Loc --      Pain Edu? --      Excl. in [place]? --     Most recent vital signs: Vitals:   12/13/22 1314 12/13/22 1430  BP: (!) 147/64 (!) 154/61  Pulse: 61 (!) 41  Resp: 17 15  Temp:    SpO2: 100% 100%    Physical Exam Constitutional:       Appearance: He is well-developed.  HENT:     Head: Atraumatic.  Eyes:     Conjunctiva/sclera: Conjunctivae normal.  Cardiovascular:     Rate and Rhythm: Bradycardia present.     Heart sounds: No murmur heard. Pulmonary:     Effort: No respiratory distress.  Musculoskeletal:        General: Normal range of motion.     Cervical back: Normal range of motion.     Right lower leg: No edema.     Left lower leg: No edema.  Skin:    General: Skin is warm.     Capillary Refill: Capillary refill takes less than 2 seconds.  Neurological:     Mental Status: He is alert. Mental status is at baseline.     GCS: GCS eye subscore is 4. GCS verbal subscore is 5. GCS motor subscore is 6.     Cranial Nerves: Cranial nerves 2-12 are intact.     Sensory: Sensation is intact.     Motor: Motor function is intact.  Coordination: Coordination is intact.  Psychiatric:        Mood and Affect: Mood normal.     IMPRESSION / MDM / [place] / ED COURSE  I reviewed the triage vital signs and the nursing notes.  Presents to the emergency department for (syncopal episode.  On arrival initial blood pressure was normal.  Glucose was 149.  Differential diagnosis including ACS, pneumonia, intracranial hemorrhage, CVA, dysrhythmia, dehydration, electrolyte abnormality  EKG  I, [name], the attending physician, personally viewed and interpreted this ECG.   Rate: Rate 45  Rhythm: Sinus bradycardia  Axis: Normal  Intervals: Normal  ST&T Change: None  N sinus bradycardia while on cardiac telemetry.  RADIOLOGY I independently reviewed imaging, my interpretation of imaging: Chest x-ray, CT scan of the head without contrast -no acute findings.  Read as no acute findings  LABS (all labs ordered are listed, but only abnormal results are displayed) Labs interpreted as -  No significant electrolyte abnormalities.  No signs of urinary tract infection.  Serial troponins are negative.  Labs  Reviewed  BASIC METABOLIC PANEL - Abnormal; Notable for the following components:      Result Value   Glucose, Bld 159 (*)    All other components within normal limits  CBC WITH DIFFERENTIAL/PLATELET - Abnormal; Notable for the following components:   RBC 3.57 (*)    Hemoglobin 12.8 (*)    HCT 36.9 (*)    MCV 103.4 (*)    MCH 35.9 (*)    Platelets 133 (*)    All other components within normal limits  URINALYSIS, ROUTINE W REFLEX MICROSCOPIC - Abnormal; Notable for the following components:   Color, Urine YELLOW (*)    APPearance HAZY (*)    Hgb urine dipstick SMALL (*)    Protein, ur 30 (*)    All other components within normal limits  CBG MONITORING, ED - Abnormal; Notable for the following components:   Glucose-Capillary 149 (*)    All other components within normal limits  RESP PANEL BY RT-PCR (RSV, FLU A&B, COVID)  RVPGX2  BRAIN NATRIURETIC PEPTIDE  LACTIC ACID, PLASMA  VITAMIN B12  TROPONIN I (HIGH SENSITIVITY)  TROPONIN I (HIGH SENSITIVITY)    TREATMENT  1 L of IV fluids  MDM  On reevaluation continues to state that he does not feel well.  Orthostatic blood pressures are negative.  Continues to be bradycardic however has normotensive.  Multiple syncopal episodes earlier today.  Consulted and discussed the patient's case with the hospitalist, concern for possible sequela and progression of [name].  Will admit for further workup and MRI.     PROCEDURES:  Critical Care performed: No  Procedures  Patient's presentation is most consistent with acute presentation with potential threat to life or bodily function.   MEDICATIONS ORDERED IN ED: Medications  aspirin EC tablet 81 mg (has no administration in time range)  atorvastatin (LIPITOR) tablet 10 mg (has no administration in time range)  pantoprazole (PROTONIX) EC tablet 40 mg (has no administration in time range)  famotidine (PEPCID) tablet 20 mg (has no administration in time range)  clopidogrel  (PLAVIX) tablet 75 mg (has no administration in time range)  ascorbic acid (VITAMIN C) tablet 500 mg (500 mg Oral Given 12/13/22 1453)  albuterol (PROVENTIL) (2.5 MG/3ML) 0.083% nebulizer solution 2.5 mg (has no administration in time range)  sodium chloride flush (NS) 0.9 % injection 3 mL (3 mLs Intravenous Given 12/13/22 1455)  enoxaparin (LOVENOX) injection 40  mg (has no administration in time range)  acetaminophen (TYLENOL) tablet 650 mg (has no administration in time range)    Or  acetaminophen (TYLENOL) suppository 650 mg (has no administration in time range)  senna-docusate (Senokot-S) tablet 1 tablet (has no administration in time range)  ondansetron (ZOFRAN) tablet 4 mg (has no administration in time range)    Or  ondansetron (ZOFRAN) injection 4 mg (has no administration in time range)  hydrALAZINE (APRESOLINE) injection 5 mg (has no administration in time range)  sodium chloride 0.9 % bolus 1,000 mL (0 mLs Intravenous Stopped 12/13/22 1240)    FINAL CLINICAL IMPRESSION(S) / ED DIAGNOSES   Final diagnoses:  Near syncope  Bradycardia     Rx / DC Orders   ED Discharge Orders     None        Note:  This document was prepared using Dragon voice recognition software and may include unintentional dictation errors.   [name], MD 12/13/22 [phone]

## 2022-12-13 NOTE — Assessment & Plan Note (Addendum)
With chest pain on exertion, cardiac etiology cannot be excluded at this time - Complete echo ordered - Cardiology consulted, Dr. Saunders Revel via secure chat and epic order for consideration of cardiac stress test

## 2022-12-14 ENCOUNTER — Ambulatory Visit: Payer: Non-veteran care | Attending: Medical

## 2022-12-14 ENCOUNTER — Observation Stay: Payer: No Typology Code available for payment source

## 2022-12-14 ENCOUNTER — Telehealth: Payer: Self-pay | Admitting: *Deleted

## 2022-12-14 DIAGNOSIS — R55 Syncope and collapse: Secondary | ICD-10-CM

## 2022-12-14 DIAGNOSIS — R001 Bradycardia, unspecified: Secondary | ICD-10-CM

## 2022-12-14 LAB — BASIC METABOLIC PANEL
Anion gap: 7 (ref 5–15)
BUN: 18 mg/dL (ref 8–23)
CO2: 25 mmol/L (ref 22–32)
Calcium: 8.6 mg/dL — ABNORMAL LOW (ref 8.9–10.3)
Chloride: 110 mmol/L (ref 98–111)
Creatinine, Ser: 0.9 mg/dL (ref 0.61–1.24)
GFR, Estimated: >60 mL/min (ref >60)
Glucose, Bld: 105 mg/dL — ABNORMAL HIGH (ref 70–99)
Potassium: 3.9 mmol/L (ref 3.5–5.1)
Sodium: 142 mmol/L (ref 135–145)

## 2022-12-14 LAB — NM MYOCAR MULTI W/SPECT W/WALL MOTION / EF
Estimated workload: 8.9
LV dias vol: 106 mL (ref 62–150)
LV sys vol: 46 mL
MPHR: 145 {beats}/min
Nuc Stress EF: 57 %
Peak HR: 123 {beats}/min
Percent HR: 84 %
Rest HR: 51 {beats}/min
Rest Nuclear Isotope Dose: 10.3 mCi
SDS: 3
SRS: 1
SSS: 1
ST Depression (mm): 0 mm
Stress Nuclear Isotope Dose: 31.4 mCi
TID: 0.97

## 2022-12-14 LAB — CBC
HCT: 33.1 % — ABNORMAL LOW (ref 39.0–52.0)
Hemoglobin: 11.6 g/dL — ABNORMAL LOW (ref 13.0–17.0)
MCH: 36.4 pg — ABNORMAL HIGH (ref 26.0–34.0)
MCHC: 35 g/dL (ref 30.0–36.0)
MCV: 103.8 fL — ABNORMAL HIGH (ref 80.0–100.0)
Platelets: 117 10*3/uL — ABNORMAL LOW (ref 150–400)
RBC: 3.19 MIL/uL — ABNORMAL LOW (ref 4.22–5.81)
RDW: 12.7 % (ref 11.5–15.5)
WBC: 6.1 10*3/uL (ref 4.0–10.5)
nRBC: 0 % (ref 0.0–0.2)

## 2022-12-14 LAB — ECHOCARDIOGRAM COMPLETE
AR max vel: 1.5 cm2
AV Area VTI: 1.59 cm2
AV Area mean vel: 1.44 cm2
AV Mean grad: 15 mmHg
AV Peak grad: 25 mmHg
Ao pk vel: 2.5 m/s
Area-P 1/2: 3.31 cm2
Height: 66 in
S' Lateral: 3.1 cm
Weight: 2821.89 oz

## 2022-12-14 LAB — TSH: TSH: 1.037 u[IU]/mL (ref 0.350–4.500)

## 2022-12-14 LAB — GLUCOSE, CAPILLARY: Glucose-Capillary: 81 mg/dL (ref 70–99)

## 2022-12-14 MED ORDER — TECHNETIUM TC 99M TETROFOSMIN IV KIT
31.4100 | PACK | Freq: Once | INTRAVENOUS | Status: AC | PRN
  Administered 2022-12-14: 31.41 via INTRAVENOUS

## 2022-12-14 MED ORDER — TECHNETIUM TC 99M TETROFOSMIN IV KIT
10.0000 | PACK | Freq: Once | INTRAVENOUS | Status: AC | PRN
  Administered 2022-12-14: 10.27 via INTRAVENOUS

## 2022-12-14 NOTE — Telephone Encounter (Signed)
-----   Message from Ko Olina, PA-C sent at 12/14/2022  1:38 PM EST ----- Regarding: heart monitor Pt needs 2 week heart monitor for bradycardia

## 2022-12-14 NOTE — TOC Initial Note (Signed)
Transition of Care [facility]) - Initial/Assessment Note    Patient Details  Name: Roger Sanchez MRN: [id number] Date of Birth: Sep 26, 1946  Transition of Care [facility]) CM/SW Contact:    Beverly Sessions, RN Phone Number: 12/14/2022, 2:43 PM  Clinical Narrative:                  Transition of Care [facility]) Screening Note   Patient Details  Name: Roger Sanchez Date of Birth: [birth year]   Transition of Care [facility]) CM/SW Contact:    Beverly Sessions, RN Phone Number: 12/14/2022, 2:43 PM    Transition of Care Department [facility]) has reviewed patient and no TOC needs have been identified at this time. We will continue to monitor patient advancement through interdisciplinary progression rounds. If new patient transition needs arise, please place a TOC consult.  Per MD and patient's spouse no TOC needs at discharge        Patient Goals and CMS Choice            Expected Discharge Plan and Services         Expected Discharge Date: 12/14/22                                    Prior Living Arrangements/Services                       Activities of Daily Living Home Assistive Devices/Equipment: None ADL Screening (condition at time of admission) Patient's cognitive ability adequate to safely complete daily activities?: Yes Is the patient deaf or have difficulty hearing?: No Does the patient have difficulty seeing, even when wearing glasses/contacts?: No Does the patient have difficulty concentrating, remembering, or making decisions?: Yes Patient able to express need for assistance with ADLs?: Yes Does the patient have difficulty dressing or bathing?: No Independently performs ADLs?: Yes (appropriate for developmental age) Does the patient have difficulty walking or climbing stairs?: No Weakness of Legs: None Weakness of Arms/Hands: None  Permission Sought/Granted                  Emotional Assessment              Admission diagnosis:   Bradycardia [R00.1] Near syncope [R55] Patient Active Problem List   Diagnosis Date Noted   Near syncope 12/13/2022   [name] ([place]) 12/13/2022   Sinus bradycardia 12/13/2022   History of CVA (cerebrovascular accident) 12/13/2022   Hyperlipidemia 12/13/2022   Dyspnea on exertion 12/13/2022   PCP:  Benito Mccreedy, MD Pharmacy:   [place], [place] [address] [place] [address] Phone: [phone] Fax: [phone]  [facility] [place], [place] [address] [place] [address] Phone: [phone] Fax: [phone]     Social Determinants of Health (SDOH) Social History: SDOH Screenings   Food Insecurity: No Food Insecurity (12/13/2022)  Housing: Low Risk  (12/13/2022)  Transportation Needs: No Transportation Needs (12/13/2022)  Utilities: Not At Risk (12/13/2022)  Tobacco Use: Low Risk  (12/13/2022)   SDOH Interventions:     Readmission Risk Interventions     No data to display

## 2022-12-14 NOTE — Discharge Instructions (Signed)
Patient follow-up with [facility] MD cardiology for cardiac monitoring evaluation. Discussed with patient's wife to make follow-up appointment with neurology at [facility] for [facility] body dementia follow-up. Patient and wife advised not to take Rivastigmine for now. Make sure to d/w Neurology. Held due to low HR

## 2022-12-14 NOTE — Plan of Care (Signed)
  Problem: Education: Goal: Knowledge of condition and prescribed therapy will improve Outcome: Progressing   Problem: Cardiac: Goal: Will achieve and/or maintain adequate cardiac output Outcome: Progressing   Problem: Health Behavior/Discharge Planning: Goal: Ability to manage health-related needs will improve Outcome: Progressing   Problem: Clinical Measurements: Goal: Ability to maintain clinical measurements within normal limits will improve Outcome: Progressing Goal: Will remain free from infection Outcome: Progressing Goal: Diagnostic test results will improve Outcome: Progressing Goal: Cardiovascular complication will be avoided Outcome: Progressing   Problem: Nutrition: Goal: Adequate nutrition will be maintained Outcome: Progressing   Problem: Pain Managment: Goal: General experience of comfort will improve Outcome: Progressing   Problem: Safety: Goal: Ability to remain free from injury will improve Outcome: Progressing   Problem: Skin Integrity: Goal: Risk for impaired skin integrity will decrease Outcome: Progressing

## 2022-12-14 NOTE — Progress Notes (Signed)
Mobility Specialist - Progress Note   12/14/22 1059  Orthostatic Lying   BP- Lying 142/66  Pulse- Lying (!) 40  Orthostatic Sitting  BP- Sitting 146/66  Pulse- Sitting 54  Orthostatic Standing at 3 minutes  BP- Standing at 3 minutes 113/72  Pulse- Standing at 3 minutes 62  Mobility  Activity Ambulated independently in hallway  Level of Assistance Independent after set-up  Assistive Device None  Distance Ambulated (ft) 180 ft  Range of Motion/Exercises Active  Activity Response Tolerated well  $Mobility charge 1 Mobility     Pt lying in bed upon arrival, utilizing RA. Orthostatics obtained; asymptomatic. Pt completed bed mobility, STS, and ambulation modI without AD. Denied dizziness, chest pain, and SOB with activity. No LOB. O2 maintained mid-high 90s throughout session. No complaints. Pt returned to bed with needs in reach. Spouse at bedside. RN notified.    Kathee Delton Mobility Specialist 12/14/22, 11:10 AM

## 2022-12-14 NOTE — Progress Notes (Signed)
Rounding Note    Patient Name: Roger Sanchez Date of Encounter: 12/14/2022  [place] Cardiologist: None   Subjective   Plan for Myoview treadmill test today.   Inpatient Medications    Scheduled Meds:  ascorbic acid  500 mg Oral Daily   aspirin EC  81 mg Oral Daily   atorvastatin  10 mg Oral QHS   clopidogrel  75 mg Oral Daily   enoxaparin (LOVENOX) injection  40 mg Subcutaneous Q24H   famotidine  20 mg Oral BID   glycopyrrolate  1 mg Oral Q0600   pantoprazole  40 mg Oral BID   rivastigmine  3 mg Oral BID   sodium chloride flush  3 mL Intravenous Q12H   [START ON 12/15/2022] tamsulosin  0.4 mg Oral QPC supper   Continuous Infusions:  PRN Meds: acetaminophen **OR** acetaminophen, albuterol, hydrALAZINE, ondansetron **OR** ondansetron (ZOFRAN) IV, senna-docusate   Vital Signs    Vitals:   12/13/22 1950 12/14/22 0418 12/14/22 0420 12/14/22 0722  BP: 139/66 (!) 145/65  130/69  Pulse: (!) 50 (!) 43  (!) 41  Resp: 20 18    Temp: 98.5 F (36.9 C) (!) 97.5 F (36.4 C)  98.3 F (36.8 C)  TempSrc: Axillary Oral    SpO2: 100% 100%  100%  Weight:   78.3 kg   Height:        Intake/Output Summary (Last 24 hours) at 12/14/2022 0753 Last data filed at 12/13/2022 2200 Gross per 24 hour  Intake 1120 ml  Output 150 ml  Net 970 ml      12/14/2022    4:20 AM 12/13/2022    4:38 PM 12/13/2022    9:27 AM  Last 3 Weights  Weight (lbs) 172 lb 9.9 oz 176 lb 5.9 oz 178 lb  Weight (kg) 78.3 kg 80 kg 80.74 kg      Telemetry    Sb HR upper 30 and 40s - Personally Reviewed  ECG    No new - Personally Reviewed  Physical Exam   GEN: No acute distress.   Neck: No JVD Cardiac: bradycardia, RR, no murmurs, rubs, or gallops.  Respiratory: Clear to auscultation bilaterally. GI: Soft, nontender, non-distended  MS: No edema; No deformity. Neuro:  Nonfocal  Psych: Normal affect   Labs    High Sensitivity Troponin:   Recent Labs  Lab 12/13/22 0955 12/13/22 1248   TROPONINIHS 4 4     Chemistry Recent Labs  Lab 12/13/22 0955 12/14/22 0247  NA 139 142  K 3.6 3.9  CL 107 110  CO2 24 25  GLUCOSE 159* 105*  BUN 22 18  CREATININE 0.76 0.90  CALCIUM 8.9 8.6*  GFRNONAA >60 >60  ANIONGAP 8 7    Lipids No results for input(s): "CHOL", "TRIG", "HDL", "LABVLDL", "LDLCALC", "CHOLHDL" in the last 168 hours.  Hematology Recent Labs  Lab 12/13/22 0955 12/14/22 0247  WBC 4.4 6.1  RBC 3.57* 3.19*  HGB 12.8* 11.6*  HCT 36.9* 33.1*  MCV 103.4* 103.8*  MCH 35.9* 36.4*  MCHC 34.7 35.0  RDW 12.4 12.7  PLT 133* 117*   Thyroid  Recent Labs  Lab 12/14/22 0247  TSH 1.037    BNP Recent Labs  Lab 12/13/22 1008  BNP 47.1    DDimer No results for input(s): "DDIMER" in the last 168 hours.   Radiology    ECHOCARDIOGRAM COMPLETE  Result Date: 12/14/2022    ECHOCARDIOGRAM REPORT   Patient Name:   KINGSTYN DERUITER [facility]  Date of Exam: 12/13/2022 Medical Rec #:  [id number]         Height:       66.0 in Accession #:    [id number]        Weight:       176.4 lb Date of Birth:  Nov 16, 1945         BSA:          1.896 m Patient Age:    13 years          BP:           139/66 mmHg Patient Gender: M                 HR:           45 bpm. Exam Location:  [facility] Procedure: 2D Echo, Cardiac Doppler and Color Doppler Indications:     R06.00 Dyspnea  History:         Patient has prior history of Echocardiogram examinations, most                  recent 12/28/2018. Stroke, Arrythmias:Bradycardia,                  Signs/Symptoms:Chest Pain; Risk Factors:Dyslipidemia.  Sonographer:     Cresenciano Lick RDCS Referring Phys:  [id number] AMY N COX Diagnosing Phys: Nelva Bush MD IMPRESSIONS  1. Left ventricular ejection fraction, by estimation, is 60 to 65%. The left ventricle has normal function. The left ventricle has no regional wall motion abnormalities. Left ventricular diastolic parameters were normal.  2. Right ventricular systolic function is normal. The right ventricular size  is normal. There is mildly elevated pulmonary artery systolic pressure.  3. Left atrial size was mildly dilated.  4. The mitral valve is normal in structure. No evidence of mitral valve regurgitation. No evidence of mitral stenosis.  5. Tricuspid valve regurgitation is moderate.  6. The aortic valve is tricuspid. There is mild calcification of the aortic valve. There is moderate thickening of the aortic valve. Aortic valve regurgitation is not visualized. Mild aortic valve stenosis. Aortic valve area, by VTI measures 1.59 cm. Aortic valve mean gradient measures 15.0 mmHg.  7. The inferior vena cava is dilated in size with >50% respiratory variability, suggesting right atrial pressure of 8 mmHg. FINDINGS  Left Ventricle: Left ventricular ejection fraction, by estimation, is 60 to 65%. The left ventricle has normal function. The left ventricle has no regional wall motion abnormalities. The left ventricular internal cavity size was normal in size. There is  no left ventricular hypertrophy. Left ventricular diastolic parameters were normal. Right Ventricle: The right ventricular size is normal. No increase in right ventricular wall thickness. Right ventricular systolic function is normal. There is mildly elevated pulmonary artery systolic pressure. The tricuspid regurgitant velocity is 2.65  m/s, and with an assumed right atrial pressure of 8 mmHg, the estimated right ventricular systolic pressure is 81.8 mmHg. Left Atrium: Left atrial size was mildly dilated. Right Atrium: Right atrial size was normal in size. Pericardium: There is no evidence of pericardial effusion. Mitral Valve: The mitral valve is normal in structure. No evidence of mitral valve regurgitation. No evidence of mitral valve stenosis. Tricuspid Valve: The tricuspid valve is normal in structure. Tricuspid valve regurgitation is moderate. Aortic Valve: The aortic valve is tricuspid. There is mild calcification of the aortic valve. There is moderate  thickening of the aortic valve. Aortic valve regurgitation is not visualized. Mild aortic stenosis is present. Aortic valve  mean gradient measures 15.0 mmHg. Aortic valve peak gradient measures 25.0 mmHg. Aortic valve area, by VTI measures 1.59 cm. Pulmonic Valve: The pulmonic valve was normal in structure. Pulmonic valve regurgitation is mild. No evidence of pulmonic stenosis. Aorta: The aortic root and ascending aorta are structurally normal, with no evidence of dilitation. Pulmonary Artery: The pulmonary artery is of normal size. Venous: The inferior vena cava is dilated in size with greater than 50% respiratory variability, suggesting right atrial pressure of 8 mmHg. IAS/Shunts: No atrial level shunt detected by color flow Doppler.  LEFT VENTRICLE PLAX 2D LVIDd:         4.80 cm   Diastology LVIDs:         3.10 cm   LV e' medial:    10.40 cm/s LV PW:         0.70 cm   LV E/e' medial:  8.9 LV IVS:        0.70 cm   LV e' lateral:   11.30 cm/s LVOT diam:     2.30 cm   LV E/e' lateral: 8.2 LV SV:         101 LV SV Index:   53 LVOT Area:     4.15 cm  RIGHT VENTRICLE             IVC RV Basal diam:  4.10 cm     IVC diam: 2.50 cm RV S prime:     13.40 cm/s TAPSE (M-mode): 2.7 cm LEFT ATRIUM             Index        RIGHT ATRIUM           Index LA diam:        4.60 cm 2.43 cm/m   RA Area:     13.10 cm LA Vol (A2C):   58.1 ml 30.65 ml/m  RA Volume:   29.70 ml  15.67 ml/m LA Vol (A4C):   82.3 ml 43.41 ml/m LA Biplane Vol: 69.9 ml 36.87 ml/m  AORTIC VALVE AV Area (Vmax):    1.50 cm AV Area (Vmean):   1.44 cm AV Area (VTI):     1.59 cm AV Vmax:           249.75 cm/s AV Vmean:          169.750 cm/s AV VTI:            0.634 m AV Peak Grad:      25.0 mmHg AV Mean Grad:      15.0 mmHg LVOT Vmax:         90.40 cm/s LVOT Vmean:        58.950 cm/s LVOT VTI:          0.242 m LVOT/AV VTI ratio: 0.38  AORTA Ao Root diam: 3.30 cm Ao Asc diam:  3.40 cm MITRAL VALVE               TRICUSPID VALVE MV Area (PHT): 3.31 cm    TR  Peak grad:   28.1 mmHg MV Decel Time: 229 msec    TR Vmax:        265.00 cm/s MV E velocity: 92.20 cm/s MV A velocity: 88.60 cm/s  SHUNTS MV E/A ratio:  1.04        Systemic VTI:  0.24 m  Systemic Diam: 2.30 cm Nelva Bush MD Electronically signed by Nelva Bush MD Signature Date/Time: 12/14/2022/7:10:37 AM    Final    MR BRAIN WO CONTRAST  Result Date: 12/13/2022 CLINICAL DATA:  Transient ischemic attack (TIA). Neuro deficit, acute, stroke suspected. EXAM: MRI HEAD WITHOUT CONTRAST TECHNIQUE: Multiplanar, multiecho pulse sequences of the brain and surrounding structures were obtained without intravenous contrast. COMPARISON:  Head CT 12/13/2022 and MRI 02/02/2022 FINDINGS: Brain: There is no evidence of an acute infarct, mass, midline shift, or extra-axial fluid collection. Small T2 hyperintensities in the cerebral white matter bilaterally are unchanged from the prior MRI and are nonspecific but compatible with mild chronic small vessel ischemic disease. Chronic medial left cerebellar infarcts are unchanged from the prior MRI, with associated hemosiderin deposition again noted. Mild cerebral atrophy is within normal limits for age. Vascular: Unchanged abnormal appearance of the distal left vertebral artery likely reflecting chronic occlusion. Other major intracranial vascular flow voids are preserved. Skull and upper cervical spine: Unremarkable bone marrow signal. Sinuses/Orbits: Unremarkable orbits. Paranasal sinuses and mastoid air cells are clear. Other: None. IMPRESSION: 1. No acute intracranial abnormality. 2. Mild chronic small vessel ischemic disease. 3. Chronic left cerebellar infarcts. Electronically Signed   By: Logan Bores M.D.   On: 12/13/2022 15:42   CT Head Wo Contrast  Result Date: 12/13/2022 CLINICAL DATA:  Mental status change, unknown cause EXAM: CT HEAD WITHOUT CONTRAST TECHNIQUE: Contiguous axial images were obtained from the base of the skull through the  vertex without intravenous contrast. RADIATION DOSE REDUCTION: This exam was performed according to the departmental dose-optimization program which includes automated exposure control, adjustment of the mA and/or kV according to patient size and/or use of iterative reconstruction technique. COMPARISON:  None Available. FINDINGS: Brain: There is periventricular white matter decreased attenuation consistent with small vessel ischemic changes. Ventricles, sulci and cisterns are prominent consistent with age related involutional changes. No acute intracranial hemorrhage, mass effect or shift. No hydrocephalus. Encephalomalacia consistent with a chronic left-sided cerebellar CVA. Vascular: No hyperdense vessel or unexpected calcification. Skull: Normal. Negative for fracture or focal lesion. Sinuses/Orbits: No acute finding. IMPRESSION: Atrophy and chronic small vessel ischemic changes. Chronic left cerebellar CVA. No acute intracranial process identified. Electronically Signed   By: Sammie Bench M.D.   On: 12/13/2022 11:06   DG Chest 2 View  Result Date: 12/13/2022 CLINICAL DATA:  77 year old male presents for evaluation of syncope and cough. EXAM: CHEST - 2 VIEW COMPARISON:  None available. FINDINGS: EKG leads project over the chest. Trachea midline. Cardiomediastinal contours and hilar structures are stable. Minimal juxta diaphragmatic airspace disease on the LEFT. No lobar consolidation. No pneumothorax. No sign of pleural effusion. On limited assessment there is no acute skeletal process. IMPRESSION: Juxta diaphragmatic airspace process may represent scarring or atelectasis. Correlate with any signs of infection. No lobar consolidation or pleural effusion. Electronically Signed   By: Zetta Bills M.D.   On: 12/13/2022 10:39    Cardiac Studies   Echo 12/13/22  1. Left ventricular ejection fraction, by estimation, is 60 to 65%. The  left ventricle has normal function. The left ventricle has no regional   wall motion abnormalities. Left ventricular diastolic parameters were  normal.   2. Right ventricular systolic function is normal. The right ventricular  size is normal. There is mildly elevated pulmonary artery systolic  pressure.   3. Left atrial size was mildly dilated.   4. The mitral valve is normal in structure. No evidence of mitral valve  regurgitation. No evidence of mitral stenosis.   5. Tricuspid valve regurgitation is moderate.   6. The aortic valve is tricuspid. There is mild calcification of the  aortic valve. There is moderate thickening of the aortic valve. Aortic  valve regurgitation is not visualized. Mild aortic valve stenosis. Aortic  valve area, by VTI measures 1.59 cm.  Aortic valve mean gradient measures 15.0 mmHg.   7. The inferior vena cava is dilated in size with >50% respiratory  variability, suggesting right atrial pressure of 8 mmHg.   Echo 12/2018  1. The left ventricle has normal systolic function, with an ejection  fraction of 55-60%. The cavity size was normal. Left ventricular diastolic  Doppler parameters are consistent with impaired relaxation.   2. The right ventricle has normal systolic function. The cavity was  normal. There is no increase in right ventricular wall thickness. Right  ventricular systolic pressure is mildly elevated with an estimated  pressure of 42.7 mmHg.   3. The aortic valve is normal in structure. Severe calcifcation of the  aortic valve. Mild stenosis of the aortic valve. Mean gradient of 11 mm  Hg.    Patient Profile     77 y.o. male  with a hx of [name], prior CVA, asthma, bradycardia, GERD, HOH, hyperlipidemia, BPH, chronic low back pain, osteopenia, who is being seen 12/13/2022 for the evaluation of dyspnea on exertion   Assessment & Plan    DOE Atpyical chest pressure - HS trop negative x 2 - longstanding chest discomfort - echo showed normal LVEF  - BNP 47.1 - plan for Myoview treadmill  test  Near syncope - echo showed normal LVEF with no significant - orthostatics ordered - CT of the head and MRI of the brain unchanged - low heart rate possibly contributing  Sinus bradycardia - HR 30-40s - TSH wnl - plan for heart monitor at discharge  Heart murmur - echo showed normal LVEF with mild AI  HLD - continue statin therapy  H/o CVA - continue ASA, Plavix and statin   For questions or updates, please contact [place] Please consult [email or web address] for contact info under        Signed, Barbara Ahart Ninfa Meeker, PA-C  12/14/2022, 7:53 AM

## 2022-12-14 NOTE — Discharge Summary (Signed)
Physician Discharge Summary   Patient: Roger Sanchez MRN: [id number] DOB: Mar 27, 1946  Admit date:     12/13/2022  Discharge date: 12/14/22  Discharge Physician: Roger Sanchez   PCP: Roger Mccreedy, MD   Recommendations at discharge:    Patient follow-up with [facility] Roger Ramon M Suarez MD cardiology for cardiac monitoring evaluation. Discussed with patient's wife to make follow-up appointment with neurology at [facility] for [facility] body dementia follow-up. Patient and wife advised not to take Rivastigmine for now. Make sure to d/w Neurology. Held due to low HR   Discharge Diagnoses: Principal Problem:   Near syncope Active Problems:   [name] (HCC)   Sinus bradycardia   History of CVA (cerebrovascular accident)   Hyperlipidemia   Dyspnea on exertion   Near syncope --d/d atypical angina, versus high clinical suspicion of progression of [name] ,? autonomic dysfunction, bradycardia - MRI of the brain without contrast negative --Tele monitor showed bradycardia at rest. Ambulated well with MT--HR maxed 62  --d/w cardiology Roger Roger Sanchez    Hyperlipidemia - Atorvastatin    History of CVA (cerebrovascular accident) - Resumed home Plavix , aspirin  - Atorvastatin    Sinus bradycardia - Known bradycardia --Ziopatch to be mailed by [facility] cardiology and pt will f/u outpt cardiology--wife aware  [name] (Roger Sanchez) - Patient follows with [place] neurology, recommended follow-up on discharge ----HOLD Rivastigmine        Consultants: [facility] cardiology Procedures performed: Myoview stress test  Disposition: Home Diet recommendation:  Discharge Diet Orders (From admission, onward)     Start     Ordered   12/14/22 0000  Diet - low sodium heart healthy        12/14/22 1434            Cardiac diet DISCHARGE MEDICATION: Allergies as of 12/14/2022       Reactions   Barley Grass    Corn-containing Products    Malt    Milk-related Compounds    Peanut-containing Drug Products    Wheat Bran    Zocor [simvastatin] Other (See Comments)   Muscle aches        Medication List     STOP taking these medications    clotrimazole 1 % cream Commonly known as: LOTRIMIN   cyclobenzaprine 10 MG tablet Commonly known as: FLEXERIL   diclofenac sodium 1 % Gel Commonly known as: VOLTAREN   erythromycin ophthalmic ointment   ranitidine 150 MG capsule Commonly known as: ZANTAC   rivastigmine 3 MG capsule Commonly known as: EXELON   sertraline 25 MG tablet Commonly known as: ZOLOFT   traMADol 50 MG tablet Commonly known as: Ultram       TAKE these medications    Albuterol Sulfate 108 (90 Base) MCG/ACT Aepb Commonly known as: PROAIR RESPICLICK Inhale 2 puffs into the lungs QID. What changed: Another medication with the same name was removed. Continue taking this medication, and follow the directions you see here.   ascorbic acid 500 MG tablet Commonly known as: VITAMIN C Take 500 mg by mouth daily.   aspirin EC 81 MG tablet Take 81 mg by mouth daily.   atorvastatin 20 MG tablet Commonly known as: LIPITOR Take 10 mg by mouth at bedtime.   clopidogrel 75 MG tablet Commonly known as: PLAVIX Take 75 mg by mouth daily.  famotidine 20 MG tablet Commonly known as: PEPCID Take 20 mg by mouth 2 (two) times daily.   glycopyrrolate 1 MG tablet Commonly known as: ROBINUL Take 1 mg by mouth daily at 6 (six) AM.   GUAIFENESIN CR PO Take by mouth.   MULTI-VITAMIN/MINERALS PO Take by mouth.   naproxen 500 MG tablet Commonly known as: NAPROSYN Take 500 mg by mouth 2 (two) times daily with a meal.   pantoprazole 40 MG tablet Commonly known as: PROTONIX Take 40 mg by mouth 2 (two) times daily.   sildenafil 100 MG tablet Commonly known as:  VIAGRA Take 100 mg by mouth daily as needed for erectile dysfunction.   tamsulosin 0.4 MG Caps capsule Commonly known as: FLOMAX Take 0.4 mg by mouth.        Follow-up Information     Sanchez, Roger Gave, MD. Schedule an appointment as soon as possible for a visit in 2 week(s).   Specialty: Cardiology Why: f/u for Bradycardia and Zio patch monitoring Contact information: [address] [place] [address] [phone]         Roger Mccreedy, MD. Schedule an appointment as soon as possible for a visit in 1 week(s).   Specialty: Family Medicine Contact information: [place] [address] [phone]                 Filed Weights   12/13/22 [phone] 12/13/22 1638 12/14/22 0420  Weight: 80.7 kg 80 kg 78.3 kg     Condition at discharge: fair  The results of significant diagnostics from this hospitalization (including imaging, microbiology, ancillary and laboratory) are listed below for reference.   Imaging Studies: NM Myocar Multi W/Spect W/Wall Motion / EF  Result Date: 12/14/2022   The study is low risk.   No ST deviation was noted.   LV perfusion is normal. There is no evidence of ischemia.   Left ventricular function is normal visually. Calculated LVEF is 47%. correlation with echo advised.   Coronary calcifications noted in the LAD, LCx and RCA.   ECHOCARDIOGRAM COMPLETE  Result Date: 12/14/2022    ECHOCARDIOGRAM REPORT   Patient Name:   Roger Sanchez Date of Exam: 12/13/2022 Medical Rec #:  [id number]         Height:       66.0 in Accession #:    [id number]        Weight:       176.4 lb Date of Birth:  29-Sep-1946         BSA:          1.896 m Patient Age:    77 years          BP:           139/66 mmHg Patient Gender: M                 HR:           45 bpm. Exam Location:  [facility] Procedure: 2D Echo, Cardiac Doppler and Color Doppler Indications:     R06.00 Dyspnea  History:         Patient has prior history of Echocardiogram  examinations, most                  recent 12/28/2018. Stroke, Arrythmias:Bradycardia,                  Signs/Symptoms:Chest Pain; Risk Factors:Dyslipidemia.  Sonographer:     Roger Sanchez Referring  Phys:  [id number] AMY N COX Diagnosing Phys: Nelva Bush MD IMPRESSIONS  1. Left ventricular ejection fraction, by estimation, is 60 to 65%. The left ventricle has normal function. The left ventricle has no regional wall motion abnormalities. Left ventricular diastolic parameters were normal.  2. Right ventricular systolic function is normal. The right ventricular size is normal. There is mildly elevated pulmonary artery systolic pressure.  3. Left atrial size was mildly dilated.  4. The mitral valve is normal in structure. No evidence of mitral valve regurgitation. No evidence of mitral stenosis.  5. Tricuspid valve regurgitation is moderate.  6. The aortic valve is tricuspid. There is mild calcification of the aortic valve. There is moderate thickening of the aortic valve. Aortic valve regurgitation is not visualized. Mild aortic valve stenosis. Aortic valve area, by VTI measures 1.59 cm. Aortic valve mean gradient measures 15.0 mmHg.  7. The inferior vena cava is dilated in size with >50% respiratory variability, suggesting right atrial pressure of 8 mmHg. FINDINGS  Left Ventricle: Left ventricular ejection fraction, by estimation, is 60 to 65%. The left ventricle has normal function. The left ventricle has no regional wall motion abnormalities. The left ventricular internal cavity size was normal in size. There is  no left ventricular hypertrophy. Left ventricular diastolic parameters were normal. Right Ventricle: The right ventricular size is normal. No increase in right ventricular wall thickness. Right ventricular systolic function is normal. There is mildly elevated pulmonary artery systolic pressure. The tricuspid regurgitant velocity is 2.65  m/s, and with an assumed right atrial pressure of 8  mmHg, the estimated right ventricular systolic pressure is 82.9 mmHg. Left Atrium: Left atrial size was mildly dilated. Right Atrium: Right atrial size was normal in size. Pericardium: There is no evidence of pericardial effusion. Mitral Valve: The mitral valve is normal in structure. No evidence of mitral valve regurgitation. No evidence of mitral valve stenosis. Tricuspid Valve: The tricuspid valve is normal in structure. Tricuspid valve regurgitation is moderate. Aortic Valve: The aortic valve is tricuspid. There is mild calcification of the aortic valve. There is moderate thickening of the aortic valve. Aortic valve regurgitation is not visualized. Mild aortic stenosis is present. Aortic valve mean gradient measures 15.0 mmHg. Aortic valve peak gradient measures 25.0 mmHg. Aortic valve area, by VTI measures 1.59 cm. Pulmonic Valve: The pulmonic valve was normal in structure. Pulmonic valve regurgitation is mild. No evidence of pulmonic stenosis. Aorta: The aortic root and ascending aorta are structurally normal, with no evidence of dilitation. Pulmonary Artery: The pulmonary artery is of normal size. Venous: The inferior vena cava is dilated in size with greater than 50% respiratory variability, suggesting right atrial pressure of 8 mmHg. IAS/Shunts: No atrial level shunt detected by color flow Doppler.  LEFT VENTRICLE PLAX 2D LVIDd:         4.80 cm   Diastology LVIDs:         3.10 cm   LV e' medial:    10.40 cm/s LV PW:         0.70 cm   LV E/e' medial:  8.9 LV IVS:        0.70 cm   LV e' lateral:   11.30 cm/s LVOT diam:     2.30 cm   LV E/e' lateral: 8.2 LV SV:         101 LV SV Index:   53 LVOT Area:     4.15 cm  RIGHT VENTRICLE  IVC RV Basal diam:  4.10 cm     IVC diam: 2.50 cm RV S prime:     13.40 cm/s TAPSE (M-mode): 2.7 cm LEFT ATRIUM             Index        RIGHT ATRIUM           Index LA diam:        4.60 cm 2.43 cm/m   RA Area:     13.10 cm LA Vol (A2C):   58.1 ml 30.65 ml/m  RA  Volume:   29.70 ml  15.67 ml/m LA Vol (A4C):   82.3 ml 43.41 ml/m LA Biplane Vol: 69.9 ml 36.87 ml/m  AORTIC VALVE AV Area (Vmax):    1.50 cm AV Area (Vmean):   1.44 cm AV Area (VTI):     1.59 cm AV Vmax:           249.75 cm/s AV Vmean:          169.750 cm/s AV VTI:            0.634 m AV Peak Grad:      25.0 mmHg AV Mean Grad:      15.0 mmHg LVOT Vmax:         90.40 cm/s LVOT Vmean:        58.950 cm/s LVOT VTI:          0.242 m LVOT/AV VTI ratio: 0.38  AORTA Ao Root diam: 3.30 cm Ao Asc diam:  3.40 cm MITRAL VALVE               TRICUSPID VALVE MV Area (PHT): 3.31 cm    TR Peak grad:   28.1 mmHg MV Decel Time: 229 msec    TR Vmax:        265.00 cm/s MV E velocity: 92.20 cm/s MV A velocity: 88.60 cm/s  SHUNTS MV E/A ratio:  1.04        Systemic VTI:  0.24 m                            Systemic Diam: 2.30 cm Nelva Bush MD Electronically signed by Nelva Bush MD Signature Date/Time: 12/14/2022/7:10:37 AM    Final    MR BRAIN WO CONTRAST  Result Date: 12/13/2022 CLINICAL DATA:  Transient ischemic attack (TIA). Neuro deficit, acute, stroke suspected. EXAM: MRI HEAD WITHOUT CONTRAST TECHNIQUE: Multiplanar, multiecho pulse sequences of the brain and surrounding structures were obtained without intravenous contrast. COMPARISON:  Head CT 12/13/2022 and MRI 02/02/2022 FINDINGS: Brain: There is no evidence of an acute infarct, mass, midline shift, or extra-axial fluid collection. Small T2 hyperintensities in the cerebral white matter bilaterally are unchanged from the prior MRI and are nonspecific but compatible with mild chronic small vessel ischemic disease. Chronic medial left cerebellar infarcts are unchanged from the prior MRI, with associated hemosiderin deposition again noted. Mild cerebral atrophy is within normal limits for age. Vascular: Unchanged abnormal appearance of the distal left vertebral artery likely reflecting chronic occlusion. Other major intracranial vascular flow voids are preserved.  Skull and upper cervical spine: Unremarkable bone marrow signal. Sinuses/Orbits: Unremarkable orbits. Paranasal sinuses and mastoid air cells are clear. Other: None. IMPRESSION: 1. No acute intracranial abnormality. 2. Mild chronic small vessel ischemic disease. 3. Chronic left cerebellar infarcts. Electronically Signed   By: Logan Bores M.D.   On: 12/13/2022 15:42   CT Head Wo Contrast  Result  Date: 12/13/2022 CLINICAL DATA:  Mental status change, unknown cause EXAM: CT HEAD WITHOUT CONTRAST TECHNIQUE: Contiguous axial images were obtained from the base of the skull through the vertex without intravenous contrast. RADIATION DOSE REDUCTION: This exam was performed according to the departmental dose-optimization program which includes automated exposure control, adjustment of the mA and/or kV according to patient size and/or use of iterative reconstruction technique. COMPARISON:  None Available. FINDINGS: Brain: There is periventricular white matter decreased attenuation consistent with small vessel ischemic changes. Ventricles, sulci and cisterns are prominent consistent with age related involutional changes. No acute intracranial hemorrhage, mass effect or shift. No hydrocephalus. Encephalomalacia consistent with a chronic left-sided cerebellar CVA. Vascular: No hyperdense vessel or unexpected calcification. Skull: Normal. Negative for fracture or focal lesion. Sinuses/Orbits: No acute finding. IMPRESSION: Atrophy and chronic small vessel ischemic changes. Chronic left cerebellar CVA. No acute intracranial process identified. Electronically Signed   By: Sammie Bench M.D.   On: 12/13/2022 11:06   DG Chest 2 View  Result Date: 12/13/2022 CLINICAL DATA:  77 year old male presents for evaluation of syncope and cough. EXAM: CHEST - 2 VIEW COMPARISON:  None available. FINDINGS: EKG leads project over the chest. Trachea midline. Cardiomediastinal contours and hilar structures are stable. Minimal juxta  diaphragmatic airspace disease on the LEFT. No lobar consolidation. No pneumothorax. No sign of pleural effusion. On limited assessment there is no acute skeletal process. IMPRESSION: Juxta diaphragmatic airspace process may represent scarring or atelectasis. Correlate with any signs of infection. No lobar consolidation or pleural effusion. Electronically Signed   By: Zetta Bills M.D.   On: 12/13/2022 10:39    Microbiology: Results for orders placed or performed during the hospital encounter of 12/13/22  Resp panel by RT-PCR (RSV, Flu A&B, Covid) Anterior Nasal Swab     Status: None   Collection Time: 12/13/22 10:15 AM   Specimen: Anterior Nasal Swab  Result Value Ref Range Status   SARS Coronavirus 2 by RT PCR NEGATIVE NEGATIVE Final    Comment: (NOTE) SARS-CoV-2 target nucleic acids are NOT DETECTED.  The SARS-CoV-2 RNA is generally detectable in upper respiratory specimens during the acute phase of infection. The lowest concentration of SARS-CoV-2 viral copies this assay can detect is 138 copies/mL. A negative result does not preclude SARS-Cov-2 infection and should not be used as the sole basis for treatment or other patient management decisions. A negative result may occur with  improper specimen collection/handling, submission of specimen other than nasopharyngeal swab, presence of viral mutation(s) within the areas targeted by this assay, and inadequate number of viral copies(<138 copies/mL). A negative result must be combined with clinical observations, patient history, and epidemiological information. The expected result is Negative.  Fact Sheet for Patients:  EntrepreneurPulse.com.au  Fact Sheet for Healthcare Providers:  IncredibleEmployment.be  This test is no t yet approved or cleared by the [place] FDA and  has been authorized for detection and/or diagnosis of SARS-CoV-2 by FDA under an Emergency Use Authorization (EUA). This  EUA will remain  in effect (meaning this test can be used) for the duration of the COVID-19 declaration under Section 564(b)(1) of the Act, 21 U.S.C.section 360bbb-3(b)(1), unless the authorization is terminated  or revoked sooner.       Influenza A by PCR NEGATIVE NEGATIVE Final   Influenza B by PCR NEGATIVE NEGATIVE Final    Comment: (NOTE) The Xpert Xpress SARS-CoV-2/FLU/RSV plus assay is intended as an aid in the diagnosis of influenza from Nasopharyngeal swab specimens and should not  be used as a sole basis for treatment. Nasal washings and aspirates are unacceptable for Xpert Xpress SARS-CoV-2/FLU/RSV testing.  Fact Sheet for Patients: EntrepreneurPulse.com.au  Fact Sheet for Healthcare Providers: IncredibleEmployment.be  This test is not yet approved or cleared by the [place] FDA and has been authorized for detection and/or diagnosis of SARS-CoV-2 by FDA under an Emergency Use Authorization (EUA). This EUA will remain in effect (meaning this test can be used) for the duration of the COVID-19 declaration under Section 564(b)(1) of the Act, 21 U.S.C. section 360bbb-3(b)(1), unless the authorization is terminated or revoked.     Resp Syncytial Virus by PCR NEGATIVE NEGATIVE Final    Comment: (NOTE) Fact Sheet for Patients: EntrepreneurPulse.com.au  Fact Sheet for Healthcare Providers: IncredibleEmployment.be  This test is not yet approved or cleared by the [place] FDA and has been authorized for detection and/or diagnosis of SARS-CoV-2 by FDA under an Emergency Use Authorization (EUA). This EUA will remain in effect (meaning this test can be used) for the duration of the COVID-19 declaration under Section 564(b)(1) of the Act, 21 U.S.C. section 360bbb-3(b)(1), unless the authorization is terminated or revoked.  Performed at [facility], [address] [place],   [address]     Labs: CBC: Recent Labs  Lab 12/13/22 0955 12/14/22 0247  WBC 4.4 6.1  NEUTROABS 2.6  --   HGB 12.8* 11.6*  HCT 36.9* 33.1*  MCV 103.4* 103.8*  PLT 133* 782*   Basic Metabolic Panel: Recent Labs  Lab 12/13/22 0955 12/14/22 0247  NA 139 142  K 3.6 3.9  CL 107 110  CO2 24 25  GLUCOSE 159* 105*  BUN 22 18  CREATININE 0.76 0.90  CALCIUM 8.9 8.6*   CBG: Recent Labs  Lab 12/13/22 0918 12/14/22 0419  GLUCAP 149* 81    Discharge time spent: greater than 30 minutes.  Signed: Fritzi Mandes, MD [facility] 12/14/2022

## 2022-12-14 NOTE — Telephone Encounter (Signed)
14 day Zio order placed as requested by Cadence Furth, PA.

## 2022-12-16 DIAGNOSIS — R001 Bradycardia, unspecified: Secondary | ICD-10-CM | POA: Diagnosis not present

## 2022-12-27 ENCOUNTER — Other Ambulatory Visit
Admission: RE | Admit: 2022-12-27 | Discharge: 2022-12-27 | Disposition: A | Discharge status: Home / Self Care | Payer: PPO | Source: Ambulatory Visit | Attending: Medical | Admitting: Medical

## 2022-12-27 ENCOUNTER — Encounter: Payer: Self-pay | Admitting: Medical

## 2022-12-27 ENCOUNTER — Ambulatory Visit: Payer: PPO | Attending: Medical | Admitting: Medical

## 2022-12-27 VITALS — BP 108/76 | HR 53 | Ht 66.0 in | Wt 177.0 lb

## 2022-12-27 DIAGNOSIS — R55 Syncope and collapse: Secondary | ICD-10-CM

## 2022-12-27 DIAGNOSIS — E785 Hyperlipidemia, unspecified: Secondary | ICD-10-CM | POA: Insufficient documentation

## 2022-12-27 DIAGNOSIS — Z8673 Personal history of transient ischemic attack (TIA), and cerebral infarction without residual deficits: Secondary | ICD-10-CM

## 2022-12-27 DIAGNOSIS — R001 Bradycardia, unspecified: Secondary | ICD-10-CM | POA: Diagnosis not present

## 2022-12-27 DIAGNOSIS — R079 Chest pain, unspecified: Secondary | ICD-10-CM | POA: Diagnosis not present

## 2022-12-27 LAB — LIPID PANEL
Cholesterol: 147 mg/dL (ref 0–200)
HDL: 61 mg/dL (ref >40)
LDL Cholesterol: 76 mg/dL (ref 0–99)
Total CHOL/HDL Ratio: 2.4 RATIO
Triglycerides: 50 mg/dL (ref <150)
VLDL: 10 mg/dL (ref 0–40)

## 2022-12-27 LAB — LDL CHOLESTEROL, DIRECT: Direct LDL: 75 mg/dL (ref 0–99)

## 2022-12-27 NOTE — Patient Instructions (Signed)
Medication Instructions:  Your physician recommends that you continue on your current medications as directed. Please refer to the Current Medication list given to you today.  *If you need a refill on your cardiac medications before your next appointment, please call your pharmacy*   Lab Work: Your physician recommends that you return for lab work: Lipid panel and direct LDL Nature conservation officer at [facility] 1st desk on the right to check in (REGISTRATION)  Lab hours: Monday- Friday (7:30 am- 5:30 pm)  If you have labs (blood work) drawn today and your tests are completely normal, you will receive your results only by: MyChart Message (if you have MyChart) OR A paper copy in the mail If you have any lab test that is abnormal or we need to change your treatment, we will call you to review the results.   Testing/Procedures: - None ordered  Follow-Up: At [facility], you and your health needs are our priority.  As part of our continuing mission to provide you with exceptional heart care, we have created designated Provider Care Teams.  These Care Teams include your primary Cardiologist (physician) and Advanced Practice Providers (APPs -  Physician Assistants and Nurse Practitioners) who all work together to provide you with the care you need, when you need it.  We recommend signing up for the patient portal called "MyChart".  Sign up information is provided on this After Visit Summary.  MyChart is used to connect with patients for Virtual Visits (Telemedicine).  Patients are able to view lab/test results, encounter notes, upcoming appointments, etc.  Non-urgent messages can be sent to your provider as well.   To learn more about what you can do with MyChart, go to [email or web address].    Your next appointment:   3 month(s)  Provider:   You may see Ida Rogue, MD or one of the following Advanced Practice Providers on your designated Care Team:   Murray Hodgkins, NP Christell Faith, PA-C Cadence Kathlen Mody, PA-C Gerrie Nordmann, NP   Other Instructions - None

## 2022-12-27 NOTE — Progress Notes (Unsigned)
Cardiology Office Note:    Date:  12/27/2022   ID:  Roger Sanchez, Roger Sanchez May 13, 1946, MRN [id number]  PCP:  Benito Mccreedy, MD  [facility] Cardiologist:  None  [facility] Electrophysiologist:  None   Referring MD: Benito Mccreedy, MD   Chief Complaint: Hospital follow-up  History of Present Illness:    Roger Sanchez is a 77 y.o. male with a hx of [name], prior CVA, asthma, bradycardia, GERD, HOH, hyperlipidemia, BPH, chronic low back pain, osteopenia who is being seen for hospital follow-up.  Patient presented to the [facility] ER after rapid response was called in the medical mall.  She was waiting for his wife was having imaging done with radiology.  Per the wife he is not feeling well.  He went downstairs to the cafeteria and came back up.  He had 1 episode where he almost passed out, he was very diaphoretic and pale, blood pressure was low.  Wife reported 2 previous episodes of near syncope that improved when he sat down and drink coffee.  He had previously been continued on Plavix due to history of CVA and TIA.  He denied any chest pain or shortness of breath.  The ER blood pressure was 152/104 pulse normal.  High-sensitivity troponin negative x 2.  BNP normal.  She was given IV fluids.  CT of the head and MRI of the brain was unchanged with no acute findings.  Cardiogram was ordered.  Echo showed normal LVEF with no wall motion abnormalities and mild AS.  Patient was bradycardiac throughout admission.  Plan was for treadmill test for atypical chest pain and heart monitor for bradycardia.  Myoview Lexiscan was low risk, no evidence of ischemia, EF of 47%, coronary calcifications noted in the LAD, left circumflex and RCA. Heart monitor is still pending  Today, the patient reports he has been overall doing well at home. He still has his heart monitor on. He has occasional sharp chest pain, this is very brief. Said he had an episdoe were he felt woozy and heart rate was in  the 30s. Today, he is feeling good. He denies SOB. He is walking some days. He had no chest pain with walking. No LLE, orthopnea, pnd.    Past Medical History:  Diagnosis Date   Arthralgia    shoulder    Asthma    Asymmetrical hearing loss    Atypical chest pain    Brachial plexus disorders    Bradycardia    Carpal tunnel syndrome    Cerebral infarction (HCC)    Closed fracture of wrist    Exposure to Agent Orange    GERD (gastroesophageal reflux disease)    HOH (hard of hearing)    bilateral   Hyperlipidemia    Hypertrophy of prostate    benign    Low back pain    Lumbar spine pain    compression fracture    Malignant neoplasm (HCC)    skin    Metatarsalgia    Osteopenia    Plantar fibromatosis    Stroke [facility]) 77 yrs old   no residual effects   Transient ischemic attack    Trigger finger     Past Surgical History:  Procedure Laterality Date   BACK SURGERY     BROW LIFT Bilateral 03/26/2021   Procedure: BLEPHAROPLASTY UPPER EYELID; W/EXCESS SKIN BROW PTOSIS REPAIR  BLEPHAROPTOSIS REPAIR; RESECT EX BILATERAL;  Surgeon: Karle Starch, MD;  Location: [place];  Service: Ophthalmology;  Laterality: Bilateral;   WRIST SURGERY Right     Current Medications: Current Meds  Medication Sig   Albuterol Sulfate 108 (90 Base) MCG/ACT AEPB Inhale 2 puffs into the lungs QID.   ascorbic acid (VITAMIN C) 500 MG tablet Take 500 mg by mouth daily.   aspirin EC 81 MG tablet Take 81 mg by mouth daily.   atorvastatin (LIPITOR) 20 MG tablet Take 10 mg by mouth at bedtime.   clopidogrel (PLAVIX) 75 MG tablet Take 75 mg by mouth daily.   famotidine (PEPCID) 20 MG tablet Take 20 mg by mouth 2 (two) times daily.   glycopyrrolate (ROBINUL) 1 MG tablet Take 1 mg by mouth daily at 6 (six) AM.   GUAIFENESIN CR PO Take by mouth.   Multiple Vitamins-Minerals (MULTI-VITAMIN/MINERALS PO) Take by mouth.   naproxen (NAPROSYN) 500 MG tablet Take 500 mg by mouth 2 (two) times daily with a  meal.   pantoprazole (PROTONIX) 40 MG tablet Take 40 mg by mouth 2 (two) times daily.   sildenafil (VIAGRA) 100 MG tablet Take 100 mg by mouth daily as needed for erectile dysfunction.   tamsulosin (FLOMAX) 0.4 MG CAPS capsule Take 0.4 mg by mouth.     Allergies:   Barley grass, Corn oil, Corn-containing products, Malt, Milk (cow), Milk-related compounds, Peanut-containing drug products, Simvastatin, and Wheat bran   Social History   Socioeconomic History   Marital status: Married    Spouse name: Not on file   Number of children: Not on file   Years of education: Not on file   Highest education level: Not on file  Occupational History   Not on file  Tobacco Use   Smoking status: Never    Passive exposure: Never   Smokeless tobacco: Never  Vaping Use   Vaping Use: Never used  Substance and Sexual Activity   Alcohol use: Never   Drug use: Never   Sexual activity: Not Currently  Other Topics Concern   Not on file  Social History Narrative   Not on file   Social Determinants of Health   Financial Resource Strain: Not on file  Food Insecurity: No Food Insecurity (12/13/2022)   Hunger Vital Sign    Worried About Running Out of Food in the Last Year: Never true    Ran Out of Food in the Last Year: Never true  Transportation Needs: No Transportation Needs (12/13/2022)   PRAPARE - Hydrologist (Medical): No    Lack of Transportation (Non-Medical): No  Physical Activity: Not on file  Stress: Not on file  Social Connections: Not on file     Family History: The patient's family history includes Heart Problems in his father; Heart attack in his paternal aunt and paternal uncle.  ROS:   Please see the history of present illness.     All other systems reviewed and are negative.  EKGs/Labs/Other Studies Reviewed:    The following studies were reviewed today:  Echo 12/2022  1. Left ventricular ejection fraction, by estimation, is 60 to 65%. The   left ventricle has normal function. The left ventricle has no regional  wall motion abnormalities. Left ventricular diastolic parameters were  normal.   2. Right ventricular systolic function is normal. The right ventricular  size is normal. There is mildly elevated pulmonary artery systolic  pressure.   3. Left atrial size was mildly dilated.   4. The mitral valve is normal in structure. No evidence of mitral valve  regurgitation. No evidence of mitral stenosis.   5. Tricuspid valve regurgitation is moderate.   6. The aortic valve is tricuspid. There is mild calcification of the  aortic valve. There is moderate thickening of the aortic valve. Aortic  valve regurgitation is not visualized. Mild aortic valve stenosis. Aortic  valve area, by VTI measures 1.59 cm.  Aortic valve mean gradient measures 15.0 mmHg.   7. The inferior vena cava is dilated in size with >50% respiratory  variability, suggesting right atrial pressure of 8 mmHg.   Echo 12/2018 1. The left ventricle has normal systolic function, with an ejection  fraction of 55-60%. The cavity size was normal. Left ventricular diastolic  Doppler parameters are consistent with impaired relaxation.   2. The right ventricle has normal systolic function. The cavity was  normal. There is no increase in right ventricular wall thickness. Right  ventricular systolic pressure is mildly elevated with an estimated  pressure of 42.7 mmHg.   3. The aortic valve is normal in structure. Severe calcifcation of the  aortic valve. Mild stenosis of the aortic valve. Mean gradient of 11 mm  Hg.   EKG:  EKG is ordered today.  The ekg ordered today demonstrates SB 52bpm, nonspecific T wave changes  Recent Labs: 12/13/2022: B Natriuretic Peptide 47.1 12/14/2022: BUN 18; Creatinine, Ser 0.90; Hemoglobin 11.6; Platelets 117; Potassium 3.9; Sodium 142; TSH 1.037  Recent Lipid Panel    Component Value Date/Time   CHOL 147 12/27/2022 1443   TRIG 50  12/27/2022 1443   HDL 61 12/27/2022 1443   CHOLHDL 2.4 12/27/2022 1443   VLDL 10 12/27/2022 1443   LDLCALC 76 12/27/2022 1443   Physical Exam:    VS:  BP 108/76 (BP Location: Left Arm, Patient Position: Sitting, Cuff Size: Normal)   Pulse (!) 53   Ht 5' 6"$  (1.676 m)   Wt 177 lb (80.3 kg)   SpO2 98%   BMI 28.57 kg/m     Wt Readings from Last 3 Encounters:  12/27/22 177 lb (80.3 kg)  12/14/22 172 lb 9.9 oz (78.3 kg)  03/26/21 175 lb (79.4 kg)     GEN:  Well nourished, well developed in no acute distress HEENT: Normal NECK: No JVD; No carotid bruits LYMPHATICS: No lymphadenopathy CARDIAC:  bradycardia, RR, no murmurs, rubs, gallops RESPIRATORY:  Clear to auscultation without rales, wheezing or rhonchi  ABDOMEN: Soft, non-tender, non-distended MUSCULOSKELETAL:  No edema; No deformity  SKIN: Warm and dry NEUROLOGIC:  Alert and oriented x 3 PSYCHIATRIC:  Normal affect   ASSESSMENT:    1. Near syncope   2. Sinus bradycardia   3. History of CVA (cerebrovascular accident)   4. Chest pain of uncertain etiology   5. Hyperlipidemia, unspecified hyperlipidemia type    PLAN:    In order of problems listed above:  Near syncope Recent admission for near syncope.  Echo showed normal LVEF with moderate TR and mild AS. CT of the head and MRI unchanged.  Heart rate was noted to be low in the 30s and 40s.  TSH within normal limits.  Heart monitor is still pending.  Sinus bradycardia Patient is not on rate lowering medications at baseline.  Heart monitor pending as above. EKG today shows SB with a heart rate of 53bpm.   Atypical chest pain High-sensitivity troponin was negative x 2 in the ER.  Myoview stress test was low risk, no ischemia, coronary artery calcifications and all 3 vessels.  Patient reports sharp chest pain, fairly  atypical.  He walks daily and has no chest pain with this.  At this point we will watch and wait symptoms.  May consider cardiac CTA in the future.  Continue  aspirin, Plavix, and Lipitor.  History of CVA Continue aspirin, Plavix, statin  Hyperlipidemia I will check a cholesterol panel with a direct LDL.  Continue statin therapy.  Disposition: Follow up in 3 month(s) with MD/APP     Signed, Shawntee Mainwaring Ninfa Meeker, PA-C  12/27/2022 4:57 PM    [facility]

## 2022-12-28 NOTE — Progress Notes (Signed)
Spoke to patient and informed him Ldl 75, goal<70. Can discuss increasing Lipitor at follow-up

## 2023-01-11 ENCOUNTER — Telehealth: Payer: Self-pay | Admitting: Cardiovascular Disease

## 2023-01-11 NOTE — Telephone Encounter (Signed)
[employer] called with the end report of the patient's monitor. Call placed to the patient to check on him. He stated that he was asymptomatic and denied weakness, syncope, chest pain, and shortness of breath. Appointment made for tomorrow 3/7 with Dr. Caryl Comes.    Patch Wear Time:  13 days and 0 hours (2024-02-09T19:11:05-499 to 2024-02-22T19:48:44-0500)   Patient had a min HR of 30 bpm, max HR of 162 bpm, and avg HR of 51 bpm. Predominant underlying rhythm was Sinus Rhythm. First Degree AV Block was present. 1 run of Ventricular Tachycardia occurred lasting 6 beats with a max rate of 141 bpm (avg 117  bpm). 15 Supraventricular Tachycardia runs occurred, the run with the fastest interval lasting 6 beats with a max rate of 162 bpm, the longest lasting 26.3 secs with an avg rate of 90 bpm. 14 Pauses occurred, the longest lasting 5.7 secs (11 bpm). Second  Degree AV Block-Mobitz I (Wenckebach) was present. Isolated SVEs were rare (<1.0%), SVE Couplets were rare (<1.0%), and SVE Triplets were rare (<1.0%). Isolated VEs were occasional (1.2%, 11265), VE Couplets were rare (<1.0%, 122), and VE Triplets were  rare (<1.0%, 4). Ventricular Bigeminy and Trigeminy were present. MD notification criteria for Symptomatic Bradycardia met - report posted prior to notification per account request (KD).

## 2023-01-11 NOTE — Progress Notes (Signed)
ELECTROPHYSIOLOGY CONSULT NOTE  Patient ID: Roger Sanchez, MRN: [id number], DOB/AGE: 1946-05-30 77 y.o. Admit date: (Not on file) Date of Consult: 01/12/2023  Primary Physician: Benito Mccreedy, MD Primary Cardiologist: new     Roger Sanchez is a 77 y.o. male who is being seen today for the evaluation of presyncope at the request of CFurth PA.    HPI Roger Sanchez is a 77 y.o. male referred for spells following hospitalization 2/24  Has been diagnosis with [name] and apparently confirmed.  He normally walks about 5 miles a day.  About a month ago he started having spells described as the room closing in around his head with tunnel vision and lightheadedness; these episodes last 3 to 5 seconds.  They are not positionally related.  No associated palpitations. What prompted this referral was an episode that occurred in the wake of these new of recent onset spells where he did not feel right going for a walk.  He lasted 5 or 10 minutes.  Thought maybe he had not eaten went to the dining room had breakfast sat down neither eating nor sitting attenuated his symptoms, he stood up and went to the lobby where his wife was having her MRI.  Standing made no difference when she came out and she saw him she appreciated as he had described that he was profusely diaphoretic and pale.  He was taken to the emergency room.  Initial heart rate ECG was 45 initial blood pressure was 150.  No the measurements were hypotensive.  While in hospital his rivastigmine was discontinued this been associated with bradycardia in susceptible patients.  Underwent extensive testing with head CT scanning, Myoview scanning.  Blood work was negative including troponins.  D-dimer was not done and CT imaging of his chest was not done  Since then he is limited his walking.  He is also noted both before and after this that when he walks up a hill he gets chest tightness and shortness of breath that is  relieved by rest.  This is both predictable and reproducible.  Over some years he has had spells characterized by diaphoresis dyspnea and lightheadedness with some flushing.  He sits down and his symptoms generally abate over 3 to 5 minutes he is able to stand without residual orthostatic intolerance   DATE TEST EF   2/24 Echo  60-65%   2/24 Myoview     % No ischemia        Date Cr K Hgb  2/24 0.9 3.9 11.6          Cryptogenic Stroke   Past Medical History:  Diagnosis Date   Arthralgia    shoulder    Asthma    Asymmetrical hearing loss    Atypical chest pain    Brachial plexus disorders    Bradycardia    Carpal tunnel syndrome    Cerebral infarction (HCC)    Closed fracture of wrist    Exposure to Agent Orange    GERD (gastroesophageal reflux disease)    HOH (hard of hearing)    bilateral   Hyperlipidemia    Hypertrophy of prostate    benign    Low back pain    Lumbar spine pain    compression fracture    Malignant neoplasm (HCC)    skin    Metatarsalgia    Osteopenia    Plantar fibromatosis    Stroke [facility]) 77 yrs old  no residual effects   Transient ischemic attack    Trigger finger       Surgical History:  Past Surgical History:  Procedure Laterality Date   BACK SURGERY     BROW LIFT Bilateral 03/26/2021   Procedure: BLEPHAROPLASTY UPPER EYELID; W/EXCESS SKIN BROW PTOSIS REPAIR  BLEPHAROPTOSIS REPAIR; RESECT EX BILATERAL;  Surgeon: Karle Starch, MD;  Location: [place];  Service: Ophthalmology;  Laterality: Bilateral;   WRIST SURGERY Right      Home Meds: Current Meds  Medication Sig   Albuterol Sulfate 108 (90 Base) MCG/ACT AEPB Inhale 2 puffs into the lungs QID.   ascorbic acid (VITAMIN C) 500 MG tablet Take 500 mg by mouth daily.   aspirin EC 81 MG tablet Take 81 mg by mouth daily.   atorvastatin (LIPITOR) 20 MG tablet Take 10 mg by mouth at bedtime.   clopidogrel (PLAVIX) 75 MG tablet Take 75 mg by mouth daily.   famotidine (PEPCID)  20 MG tablet Take 20 mg by mouth 2 (two) times daily.   glycopyrrolate (ROBINUL) 1 MG tablet Take 1 mg by mouth daily at 6 (six) AM.   GUAIFENESIN CR PO Take by mouth.   Multiple Vitamins-Minerals (MULTI-VITAMIN/MINERALS PO) Take by mouth.   naproxen (NAPROSYN) 500 MG tablet Take 500 mg by mouth 2 (two) times daily with a meal.   pantoprazole (PROTONIX) 40 MG tablet Take 40 mg by mouth 2 (two) times daily.   sildenafil (VIAGRA) 100 MG tablet Take 100 mg by mouth daily as needed for erectile dysfunction.   tamsulosin (FLOMAX) 0.4 MG CAPS capsule Take 0.4 mg by mouth.    Allergies:  Allergies  Allergen Reactions   Barley Grass    Corn Oil Other (See Comments)   Corn-Containing Products Other (See Comments)   Malt Other (See Comments)   Milk (Cow) Other (See Comments)   Milk-Related Compounds    Peanut-Containing Drug Products Other (See Comments)   Simvastatin Other (See Comments)    Muscle aches   Wheat     Social History   Socioeconomic History   Marital status: Married    Spouse name: Not on file   Number of children: Not on file   Years of education: Not on file   Highest education level: Not on file  Occupational History   Not on file  Tobacco Use   Smoking status: Never    Passive exposure: Never   Smokeless tobacco: Never  Vaping Use   Vaping Use: Never used  Substance and Sexual Activity   Alcohol use: Never   Drug use: Never   Sexual activity: Not Currently  Other Topics Concern   Not on file  Social History Narrative   Not on file   Social Determinants of Health   Financial Resource Strain: Not on file  Food Insecurity: No Food Insecurity (12/13/2022)   Hunger Vital Sign    Worried About Running Out of Food in the Last Year: Never true    Ran Out of Food in the Last Year: Never true  Transportation Needs: No Transportation Needs (12/13/2022)   PRAPARE - Hydrologist (Medical): No    Lack of Transportation (Non-Medical): No   Physical Activity: Not on file  Stress: Not on file  Social Connections: Not on file  Intimate Partner Violence: Not At Risk (12/13/2022)   Humiliation, Afraid, Rape, and Kick questionnaire    Fear of Current or Ex-Partner: No    Emotionally Abused:  No    Physically Abused: No    Sexually Abused: No     Family History  Problem Relation Age of Onset   Heart Problems Father    Heart attack Paternal Aunt    Heart attack Paternal Uncle      ROS:  Please see the history of present illness.     All other systems reviewed and negative.    Physical Exam: Blood pressure 100/62, pulse (!) 52, height '5\' 6"'$  (1.676 m), weight 177 lb 2 oz (80.3 kg), SpO2 97 %. General: Well developed, well nourished male in no acute distress. Head: Normocephalic, atraumatic, sclera non-icteric, no xanthomas, nares are without discharge. EENT: normal  Lymph Nodes:  none Neck: Negative for carotid bruits. JVD not elevated. Back:without scoliosis kyphosis Lungs: Clear bilaterally to auscultation without wheezes, rales, or rhonchi. Breathing is unlabored. Heart: RRR with S1 S2. No  murmur . No rubs, or gallops appreciated. Abdomen: Soft, non-tender, non-distended with normoactive bowel sounds. No hepatomegaly. No rebound/guarding. No obvious abdominal masses. Msk:  Strength and tone appear normal for age. Extremities: No clubbing or cyanosis. No edema.  Distal pedal pulses are 2+ and equal bilaterally. Skin: Warm and Dry Neuro: Alert and oriented X 3. CN III-XII intact Grossly normal sensory and motor function . Psych:  Responds to questions appropriately with a normal affect.        EKG: Sinus at 52 Interval 19/08/44  Event recorder demonstrates multiple pauses between 6 PM and 6 AM.  These include possible sleeping hours no pauses were noted during the other hours.  Also had bradycardia at 1200 hrs. into the 30s and episodes of nonsustained SVT and possibly VT. Assessment and Plan:  Lightheadedness  suggestive of neurally mediated events  Presyncope brief onset offset suggestive of an arrhythmic event  Prolonged episode of weakness associate with diaphoresis and weakness question mechanism  [name]  SVT-nonsustained  VT-nonsustained  Cryptogenic stroke on DAPT  The patient has 3 different types of spells, the first is a 3 to 5-minute spell over some years duration associated with diaphoresis and weakness which sound like neurally mediated events.  While the most recent event was also with diuresis and weakness, in his mind it was distinctly different and that also lasted much longer now more than an hour.  This could have been a bradycardia arrhythmic event as he had notable bradycardia on arrival to the ER and then throughout the night.  His heart rates have been slow over the years with most recorded in the 50s, it could have been made worse by the rivastigmine.   The other episodes are more concerningly arrhythmic given the brevity.  Event recorder has evidence of bradycardia and profound pauses in what are possibly daytime not sleeping hours, he unfortunately however is taking a lot of naps these days.  To try to elucidate this I think an event recorder is most effective and appropriate.  They are agreeable.  Will need to use it primarily as a symptom activated event given his tendency towards bradycardia  I am also concerned that the Myoview scan is falsely negative given the very strong pretest probability based on his history of reproducible exertional angina relieved by rest.  Will undertake CTA.  It is possible also that the SVT and or the VT as a sustained arrhythmia might have contributed to the protracted episode of weakness and lightheadedness.  There is a literature to be explored, and that is of bradycardia arrhythmic events and Lewy  body dementia.  A paper published last June and the clinical of autonomic research will be my lunchtime reading.    This may inform  a more deliberate approach of pacing and this cohort of patients given his symptoms.  Otherwise related to loop recorder inform our decision making   Virl Axe

## 2023-01-11 NOTE — Telephone Encounter (Signed)
Alise with [employer] is calling to report abnormal Zio monitor results.

## 2023-01-12 ENCOUNTER — Encounter: Payer: Self-pay | Admitting: Internal Medicine

## 2023-01-12 ENCOUNTER — Ambulatory Visit: Payer: PPO | Attending: Internal Medicine | Admitting: Internal Medicine

## 2023-01-12 VITALS — BP 100/62 | HR 52 | Ht 66.0 in | Wt 177.1 lb

## 2023-01-12 DIAGNOSIS — R001 Bradycardia, unspecified: Secondary | ICD-10-CM

## 2023-01-12 DIAGNOSIS — R072 Precordial pain: Secondary | ICD-10-CM

## 2023-01-12 NOTE — Patient Instructions (Addendum)
Medication Instructions:  Your physician recommends that you continue on your current medications as directed. Please refer to the Current Medication list given to you today.  *If you need a refill on your cardiac medications before your next appointment, please call your pharmacy*  Lab Work: BMET prior to CT scan You will get your lab work at [facility]) hospital.  Your lab work will be done at [facility] next to [employer].  These are walk in labs- you will not need an appointment and you do not need to be fasting.    Testing/Procedures: Your physician has requested that you have cardiac CT. Cardiac computed tomography (CT) is a painless test that uses an x-ray machine to take clear, detailed pictures of your heart. For further information please visit [email or web address]. Please follow instruction sheet as given.  Follow-Up: At [facility], you and your health needs are our priority.  As part of our continuing mission to provide you with exceptional heart care, we have created designated Provider Care Teams.  These Care Teams include your primary Cardiologist (physician) and Advanced Practice Providers (APPs -  Physician Assistants and Nurse Practitioners) who all work together to provide you with the care you need, when you need it.  Your next appointment:   Next available for a loop recorder implant  Provider:   Dr. Caryl Comes Other Instructions    Your cardiac CT will be scheduled at one of the below locations:   [facility] [address] [place], [place] [address] [phone]  [place] [address] [place], [place] [address] [phone]  [facility] [place], [place] [address] [phone]  If scheduled at [facility], please arrive at the [facility] and Children's Entrance (Entrance C2) of [facility] 30  minutes prior to test start time. You can use the FREE valet parking offered at entrance C (encouraged to control the heart rate for the test)  Proceed to the [facility] Radiology Department (first floor) to check-in and test prep.  All radiology patients and guests should use entrance C2 at [facility], accessed from [facility], even though the hospital's physical address listed is [address]    If scheduled at [facility] or [facility], please arrive 15 mins early for check-in and test prep.   Please follow these instructions carefully (unless otherwise directed):  Hold all erectile dysfunction medications at least 3 days (72 hrs) prior to test. (Ie viagra, cialis, sildenafil, tadalafil, etc) We will administer nitroglycerin during this exam.   On the Night Before the Test: Be sure to Drink plenty of water. Do not consume any caffeinated/decaffeinated beverages or chocolate 12 hours prior to your test. Do not take any antihistamines 12 hours prior to your test.  On the Day of the Test: Drink plenty of water until 1 hour prior to the test. Do not eat any food 1 hour prior to test. You may take your regular medications prior to the test.       After the Test: Drink plenty of water. After receiving IV contrast, you may experience a mild flushed feeling. This is normal. On occasion, you may experience a mild rash up to 24 hours after the test. This is not dangerous. If this occurs, you can take Benadryl 25 mg and increase your fluid intake. If you  experience trouble breathing, this can be serious. If it is severe call 911 IMMEDIATELY. If it is mild, please call our office. If you take any of these medications: Glipizide/Metformin, Avandament, Glucavance, please do not take 48 hours after completing test unless otherwise instructed.  We will call to schedule your test 2-4 weeks out understanding that some  insurance companies will need an authorization prior to the service being performed.   For non-scheduling related questions, please contact the cardiac imaging nurse navigator should you have any questions/concerns: Marchia Bond, Cardiac Imaging Nurse Navigator Gordy Clement, Cardiac Imaging Nurse Navigator [facility] Direct Office Dial: [phone]   For scheduling needs, including cancellations and rescheduling, please call [place], [phone].

## 2023-01-13 ENCOUNTER — Other Ambulatory Visit
Admission: RE | Admit: 2023-01-13 | Discharge: 2023-01-13 | Disposition: A | Discharge status: Home / Self Care | Payer: PPO | Source: Ambulatory Visit | Attending: Internal Medicine | Admitting: Internal Medicine

## 2023-01-13 ENCOUNTER — Telehealth: Payer: Self-pay | Admitting: Internal Medicine

## 2023-01-13 DIAGNOSIS — R001 Bradycardia, unspecified: Secondary | ICD-10-CM | POA: Insufficient documentation

## 2023-01-13 DIAGNOSIS — R072 Precordial pain: Secondary | ICD-10-CM | POA: Insufficient documentation

## 2023-01-13 LAB — BASIC METABOLIC PANEL
Anion gap: 8 (ref 5–15)
BUN: 17 mg/dL (ref 8–23)
CO2: 25 mmol/L (ref 22–32)
Calcium: 8.9 mg/dL (ref 8.9–10.3)
Chloride: 105 mmol/L (ref 98–111)
Creatinine, Ser: 0.79 mg/dL (ref 0.61–1.24)
GFR, Estimated: >60 mL/min (ref >60)
Glucose, Bld: 113 mg/dL — ABNORMAL HIGH (ref 70–99)
Potassium: 3.9 mmol/L (ref 3.5–5.1)
Sodium: 138 mmol/L (ref 135–145)

## 2023-01-13 NOTE — Telephone Encounter (Signed)
I called and spoke with the patient's wife (ok per DPR) regarding pre-procedure loop recorder implant instructions as stated below.   Roger Sanchez voices understanding of these instructions.  All questions were answered.   Your physician has recommended that you have an Implantable Loop Recorder (LINQ device) placed.   1) Please shower the night before and morning of your procedure with an antibacterial soap (any brand). 2) You may eat a light breakfast/ lunch prior to coming in. 3) You may take all of your regular medications as normal the day of your procedure 4) You may drive yourself home from the procedure 5) If you have someone with you, they will be asked to step out of the room during the implant  Instructions also released to the patient's MyChart.

## 2023-01-13 NOTE — Telephone Encounter (Signed)
Wife states patient is scheduled to have a loop recorder and wants to know if the patient will need to hold his medication prior to the procedure.

## 2023-01-31 ENCOUNTER — Telehealth: Payer: Self-pay | Admitting: Internal Medicine

## 2023-01-31 NOTE — Telephone Encounter (Signed)
Pt wife called in asking to speak to RN about some question regarding loop recorder implant. Also had a question on if it had been approved by insurance yet.

## 2023-01-31 NOTE — Telephone Encounter (Signed)
Spoke with pt's wife, DPR who is asking how loop implant is going to assist Dr Caryl Comes in knowing if pt needs a pacemaker.  Reviewed Dr Olin Pia note as below with pt's wife.  [place] has been sent for precert but uncertain if it has been approved. Recommended pt contact insurance company to confirm approval.  Pt's wife verbalizes understanding and thanked Therapist, sports for the call.  The patient has 3 different types of spells, the first is a 3 to 5-minute spell over some years duration associated with diaphoresis and weakness which sound like neurally mediated events.  While the most recent event was also with diuresis and weakness, in his mind it was distinctly different and that also lasted much longer now more than an hour.  This could have been a bradycardia arrhythmic event as he had notable bradycardia on arrival to the ER and then throughout the night.  His heart rates have been slow over the years with most recorded in the 50s, it could have been made worse by the rivastigmine.   The other episodes are more concerningly arrhythmic given the brevity.  Event recorder has evidence of bradycardia and profound pauses in what are possibly daytime not sleeping hours, he unfortunately however is taking a lot of naps these days.  To try to elucidate this I think an event recorder is most effective and appropriate.  They are agreeable.  Will need to use it primarily as a symptom activated event given his tendency towards bradycardia

## 2023-02-08 ENCOUNTER — Telehealth: Payer: Self-pay | Admitting: Internal Medicine

## 2023-02-08 NOTE — Telephone Encounter (Signed)
Pt wife called in stating she spoke with her insurance company and they do not have any prior auth started for loop recorder procedure. Please advise.

## 2023-02-09 NOTE — Telephone Encounter (Signed)
Spoke with patient and he is aware no PA is required for his loop recorder procedure

## 2023-02-10 ENCOUNTER — Telehealth (HOSPITAL_COMMUNITY): Payer: Self-pay | Admitting: Emergency Medicine

## 2023-02-10 NOTE — Telephone Encounter (Signed)
Reaching out to patient to offer assistance regarding upcoming cardiac imaging study; pt verbalizes understanding of appt date/time, parking situation and where to check in, pre-test NPO status and medications ordered, and verified current allergies; name and call back number provided for further questions should they arise [place] RN Navigator Cardiac Imaging [facility] [phone] office [phone] cell  Hold viagra

## 2023-02-13 ENCOUNTER — Ambulatory Visit
Admission: RE | Admit: 2023-02-13 | Discharge: 2023-02-13 | Disposition: A | Discharge status: Home / Self Care | Payer: PPO | Source: Ambulatory Visit | Attending: Internal Medicine | Admitting: Internal Medicine

## 2023-02-13 DIAGNOSIS — I471 Supraventricular tachycardia, unspecified: Secondary | ICD-10-CM | POA: Insufficient documentation

## 2023-02-13 DIAGNOSIS — R072 Precordial pain: Secondary | ICD-10-CM

## 2023-02-13 DIAGNOSIS — I4729 Other ventricular tachycardia: Secondary | ICD-10-CM | POA: Insufficient documentation

## 2023-02-13 MED ORDER — IOHEXOL 350 MG/ML SOLN
75.0000 mL | Freq: Once | INTRAVENOUS | Status: AC | PRN
  Administered 2023-02-13: 75 mL via INTRAVENOUS

## 2023-02-13 MED ORDER — NITROGLYCERIN 0.4 MG SL SUBL
0.8000 mg | SUBLINGUAL_TABLET | Freq: Once | SUBLINGUAL | Status: AC
  Administered 2023-02-13: 0.8 mg via SUBLINGUAL

## 2023-02-13 NOTE — Progress Notes (Signed)
Pt completed exam. Pt ABCs intact. Pt denies any complaints. Pt encouraged to drink plenty of water throughout the day. Pt ambulatory with steady gait.

## 2023-02-14 ENCOUNTER — Ambulatory Visit: Payer: PPO | Attending: Internal Medicine | Admitting: Internal Medicine

## 2023-02-14 ENCOUNTER — Encounter: Payer: Self-pay | Admitting: Internal Medicine

## 2023-02-14 VITALS — BP 112/70 | HR 42 | Ht 66.0 in | Wt 179.0 lb

## 2023-02-14 DIAGNOSIS — I471 Supraventricular tachycardia, unspecified: Secondary | ICD-10-CM

## 2023-02-14 DIAGNOSIS — I4729 Other ventricular tachycardia: Secondary | ICD-10-CM

## 2023-02-14 DIAGNOSIS — Z8673 Personal history of transient ischemic attack (TIA), and cerebral infarction without residual deficits: Secondary | ICD-10-CM

## 2023-02-14 NOTE — Progress Notes (Signed)
Patient Care Team: Louanne Skye, MD as PCP - General (Family Medicine) Duke Salvia, MD as PCP - Electrophysiology (Cardiology)   HPI  Roger Sanchez is a 77 y.o. male seen in follow-up for spells following hospitalization 2/24.  He has a history of [name] and a series of different types of spells some which are extremely short and others more prolonged suggestive of a neurally mediated event however, these have been associated with more profound bradycardia as well in the setting of chronic bradycardia.  In this regard his rivastigmine was discontinued because of concerns about bradycardia arrhythmias.  The recommendation was to proceed with loop recorder implantation    History of chest pain concerning for angina.  Myoview scan was nonischemic, CTA undertaken 4/24 demonstrated no obstructive disease.   Continues with episodic dyspnea-not predictable but frequently associated with chest discomfort.  Reviewed heart rate monitoring again demonstrating heart rate excursions in the low 100s associated with a increased average heart rate suggesting activity  Asks to question whether in the context of his [name] this is worse or not.  DATE TEST EF    2/24 Echo  60-65%    2/24 Myoview     % No ischemia   4/24 CTA   Nonobstructive CAD     Date Cr K Hgb  2/24 0.9 3.9 11.6                 Records and Results Reviewed   Past Medical History:  Diagnosis Date   Arthralgia    shoulder    Asthma    Asymmetrical hearing loss    Atypical chest pain    Brachial plexus disorders    Bradycardia    Carpal tunnel syndrome    Cerebral infarction    Closed fracture of wrist    Exposure to Agent Orange    GERD (gastroesophageal reflux disease)    HOH (hard of hearing)    bilateral   Hyperlipidemia    Hypertrophy of prostate    benign    Low back pain    Lumbar spine pain    compression fracture    Malignant neoplasm    skin    Metatarsalgia     Osteopenia    Plantar fibromatosis    Stroke 77 yrs old   no residual effects   Transient ischemic attack    Trigger finger     Past Surgical History:  Procedure Laterality Date   BACK SURGERY     BROW LIFT Bilateral 03/26/2021   Procedure: BLEPHAROPLASTY UPPER EYELID; W/EXCESS SKIN BROW PTOSIS REPAIR  BLEPHAROPTOSIS REPAIR; RESECT EX BILATERAL;  Surgeon: Imagene Riches, MD;  [facility];  Service: Ophthalmology;  Laterality: Bilateral;   WRIST SURGERY Right     Current Meds  Medication Sig   Albuterol Sulfate 108 (90 Base) MCG/ACT AEPB Inhale 2 puffs into the lungs QID.   ascorbic acid (VITAMIN C) 500 MG tablet Take 500 mg by mouth daily.   aspirin EC 81 MG tablet Take 81 mg by mouth daily.   atorvastatin (LIPITOR) 20 MG tablet Take 10 mg by mouth at bedtime.   clopidogrel (PLAVIX) 75 MG tablet Take 75 mg by mouth daily.   famotidine (PEPCID) 20 MG tablet Take 20 mg by mouth 2 (two) times daily.   GUAIFENESIN CR PO Take by mouth.   Multiple Vitamins-Minerals (MULTI-VITAMIN/MINERALS PO) Take by mouth.   naproxen (NAPROSYN) 500 MG  tablet Take 500 mg by mouth 2 (two) times daily with a meal.   pantoprazole (PROTONIX) 40 MG tablet Take 40 mg by mouth 2 (two) times daily.   sildenafil (VIAGRA) 100 MG tablet Take 100 mg by mouth daily as needed for erectile dysfunction.   tamsulosin (FLOMAX) 0.4 MG CAPS capsule Take 0.4 mg by mouth.    Allergies  Allergen Reactions   Barley Grass    Corn Oil Other (See Comments)   Corn-Containing Products Other (See Comments)   Malt Other (See Comments)   Milk (Cow) Other (See Comments)   Milk-Related Compounds    Peanut-Containing Drug Products Other (See Comments)   Simvastatin Other (See Comments)    Muscle aches   Wheat       Review of Systems negative except from HPI and PMH  Physical Exam BP 112/70   Pulse (!) 42   Ht 5\' 6"  (1.676 m)   Wt 179 lb (81.2 kg)   SpO2 97%   BMI 28.89 kg/m  Well developed and  well nourished in no acute distress HENT normal E scleral and icterus clear Neck Supple JVP flat; carotids brisk and full Clear to ausculation Regular rate and rhythm, no murmurs gallops or rub Soft with active bowel sounds No clubbing cyanosis  Edema Alert and oriented, grossly normal motor and sensory function Skin Warm and Dry  ECG sinus at 42  20/09/45  CrCl cannot be calculated (Patient's most recent lab result is older than the maximum 21 days allowed.).   Assessment and  Plan  Lightheadedness suggestive of neurally mediated events   Presyncope brief onset offset suggestive of an arrhythmic event  Sinus bradycardia without certainty of chronotropic incompetence   Prolonged episode of weakness associate with diaphoresis and weakness question mechanism   [name]   SVT-nonsustained   VT-nonsustained   Cryptogenic stroke on DAPT  End-of-life discussions  Again reviewed the event recorder and the issues related to symptoms associated with arrhythmias noted i.e. bradycardia and pauses.  Have agreed to proceed with loop recorder insertion with the hope of trying to identify a quality of life affecting arrhythmia.  We reviewed the risks and benefits.  In this context, and with his [name], we also explore his end-of-life desire; he is quite clear that in the event of cardiac or respiratory arrest he would like not to be resuscitated and just to let nature take its course  Will write a DNR order  Pre op Dx spells weakness and dyspnea Post op Dx Same  Procedure  Loop Recorder implantation  DESCRIPTION OF PROCEDURE:  Informed written consent was obtained.   Time Out Completed with  [facility]  [employer]ry rep  After routine prep and drape of the left parasternal area, a small incision was created. A Medtronic LINQ Reveal Loop Recorder  Serial Number  [id number] was inserted.    SteriStrip dressing was  applied.  The patient tolerated the  procedure without apparent complication.  EBL < 10cc    Current medicines are reviewed at length with the patient today .  The patient does not  have concerns regarding medicines.

## 2023-02-14 NOTE — Patient Instructions (Signed)
Medication Instructions:  Your physician recommends that you continue on your current medications as directed. Please refer to the Current Medication list given to you today.  *If you need a refill on your cardiac medications before your next appointment, please call your pharmacy*   Lab Work: None ordered.  If you have labs (blood work) drawn today and your tests are completely normal, you will receive your results only by: MyChart Message (if you have MyChart) OR A paper copy in the mail If you have any lab test that is abnormal or we need to change your treatment, we will call you to review the results.   Testing/Procedures: None ordered.    Follow-Up: At [facility], you and your health needs are our priority.  As part of our continuing mission to provide you with exceptional heart care, we have created designated Provider Care Teams.  These Care Teams include your primary Cardiologist (physician) and Advanced Practice Providers (APPs -  Physician Assistants and Nurse Practitioners) who all work together to provide you with the care you need, when you need it.  We recommend signing up for the patient portal called "MyChart".  Sign up information is provided on this After Visit Summary.  MyChart is used to connect with patients for Virtual Visits (Telemedicine).  Patients are able to view lab/test results, encounter notes, upcoming appointments, etc.  Non-urgent messages can be sent to your provider as well.   To learn more about what you can do with MyChart, go to [email or web address].    Your next appointment:   3 months with Dr Graciela Husbands    Implantable Loop Recorder Placement, Care After This sheet gives you information about how to care for yourself after your procedure. Your health care provider may also give you more specific instructions. If you have problems or questions, contact your health care provider. What can I expect after the procedure? After the  procedure, it is common to have: Soreness or discomfort near the incision. Some swelling or bruising near the incision.  Follow these instructions at home: Incision care  Monitor your cardiac device site for redness, swelling, and drainage. Call the device clinic at [phone] if you experience these symptoms or fever/chills.  Keep the large square bandage on your site until Saturday and then you may remove it yourself. Keep the steri-strips underneath in place. They will usually fall off on their own, or may be removed after 10 days.  You may shower tomorrow with the large bandage in place.   Pat dry.   Avoid lotions, ointments, or perfumes over your incision until it is well-healed.  Please do not submerge in water until your site is completely healed.   Your device is MRI compatible.   Remote monitoring is used to monitor your cardiac device from home. This monitoring is scheduled every month by our office. It allows [place] to keep an eye on the function of your device to ensure it is working properly.  If your wound site starts to bleed apply pressure.    For help with the monitor please call Medtronic Monitor Support Specialist directly at [phone].    If you have any questions/concerns please call the device clinic at [phone].  Activity  Return to your normal activities.  General instructions Follow instructions from your health care provider about how to manage your implantable loop recorder and transmit the information. Learn how to activate a recording if this is necessary for your type of device. You may go through  a metal detection gate, and you may let someone hold a metal detector over your chest. Show your ID card if needed. Do not have an MRI unless you check with your health care provider first. Take over-the-counter and prescription medicines only as told by your health care provider. Keep all follow-up visits as told by your health care provider. This is  important. Contact a health care provider if: You have redness, swelling, or pain around your incision. You have a fever. You have pain that is not relieved by your pain medicine. You have triggered your device because of fainting (syncope) or because of a heartbeat that feels like it is racing, slow, fluttering, or skipping (palpitations). Get help right away if you have: Chest pain. Difficulty breathing. Summary After the procedure, it is common to have soreness or discomfort near the incision. Change your dressing as told by your health care provider. Follow instructions from your health care provider about how to manage your implantable loop recorder and transmit the information. Keep all follow-up visits as told by your health care provider. This is important. This information is not intended to replace advice given to you by your health care provider. Make sure you discuss any questions you have with your health care provider. Document Released: 10/05/2015 Document Revised: 12/09/2017 Document Reviewed: 12/09/2017 Elsevier Patient Education  2020 [employer].

## 2023-02-22 ENCOUNTER — Encounter: Payer: Self-pay | Admitting: Internal Medicine

## 2023-03-21 ENCOUNTER — Ambulatory Visit (INDEPENDENT_AMBULATORY_CARE_PROVIDER_SITE_OTHER): Payer: PPO

## 2023-03-21 ENCOUNTER — Telehealth: Payer: Self-pay

## 2023-03-21 DIAGNOSIS — R55 Syncope and collapse: Secondary | ICD-10-CM | POA: Diagnosis not present

## 2023-03-21 LAB — CUP PACEART REMOTE DEVICE CHECK
Date Time Interrogation Session: 20240514103334
Implantable Pulse Generator Implant Date: 20240409

## 2023-03-21 NOTE — Telephone Encounter (Signed)
Following alert received from [employer] received for ILR summary report received. Battery status OK. Normal device function. No new tachy or brady episodes. No new AF episodes. Monthly summary reports and ROV/PRN.  2 Symptom episodes c/w SR with PVCs. 11 Pause detections. Longest of 5 sec during daytime hrs. ECGs c/w intermittent sinus arrest. 1 false pause due to undersensing. Route to Triage for true pauses.   Alert notifications adjusted to Syncope not on OAC per EMR hx of Syncope and bradycardia, as well as CVA on DAPT.  Patient called, reports feeling "very well" over the past month. His only complaint was one day last week (unable to recall specific day) he felt very fatigued. Otherwise, he is doing well and taking medications. Denies any lightheadedness, dizziness or syncope. Routing to Dr. Graciela Husbands for review.

## 2023-03-27 NOTE — Telephone Encounter (Signed)
Following alert received from [employer] received on ILR alert for 2 new pause events.  Events occurred 5/19, one @ 05:20, 3sec in duration, the other @ 15:01, duration 3.4sec, no symptom activation.  Patient denies any specific symptoms other than feeling fatigued for sometime now. Denies dizziness, lightheadedness or syncope. Pt advised if he has any symptoms in the future please call to let us know. Pt voiced understanding. Appreciative of call.

## 2023-03-30 ENCOUNTER — Ambulatory Visit: Payer: PPO | Attending: Medical | Admitting: Medical

## 2023-03-30 ENCOUNTER — Encounter: Payer: Self-pay | Admitting: Medical

## 2023-03-30 VITALS — BP 122/76 | HR 54 | Ht 66.0 in | Wt 174.0 lb

## 2023-03-30 DIAGNOSIS — R55 Syncope and collapse: Secondary | ICD-10-CM | POA: Diagnosis not present

## 2023-03-30 DIAGNOSIS — Z8673 Personal history of transient ischemic attack (TIA), and cerebral infarction without residual deficits: Secondary | ICD-10-CM

## 2023-03-30 DIAGNOSIS — R0789 Other chest pain: Secondary | ICD-10-CM

## 2023-03-30 DIAGNOSIS — R001 Bradycardia, unspecified: Secondary | ICD-10-CM

## 2023-03-30 DIAGNOSIS — E782 Mixed hyperlipidemia: Secondary | ICD-10-CM

## 2023-03-30 NOTE — Patient Instructions (Signed)
Medication Instructions:   Your physician recommends that you continue on your current medications as directed. Please refer to the Current Medication list given to you today.  *If you need a refill on your cardiac medications before your next appointment, please call your pharmacy*   Lab Work:  None Ordered  If you have labs (blood work) drawn today and your tests are completely normal, you will receive your results only by: MyChart Message (if you have MyChart) OR A paper copy in the mail If you have any lab test that is abnormal or we need to change your treatment, we will call you to review the results.   Testing/Procedures:  None Ordered    Follow-Up: At [facility], you and your health needs are our priority.  As part of our continuing mission to provide you with exceptional heart care, we have created designated Provider Care Teams.  These Care Teams include your primary Cardiologist (physician) and Advanced Practice Providers (APPs -  Physician Assistants and Nurse Practitioners) who all work together to provide you with the care you need, when you need it.  We recommend signing up for the patient portal called "MyChart".  Sign up information is provided on this After Visit Summary.  MyChart is used to connect with patients for Virtual Visits (Telemedicine).  Patients are able to view lab/test results, encounter notes, upcoming appointments, etc.  Non-urgent messages can be sent to your provider as well.   To learn more about what you can do with MyChart, go to [email or web address].    Your next appointment:    DOD slot with Dr. Graciela Husbands on Tuesday 3 month appt with Dr. Kirke Corin

## 2023-03-30 NOTE — Progress Notes (Signed)
Cardiology Office Note:    Date:  03/30/2023   ID:  Roger Sanchez [birth year], MRN [id number]  PCP:  Roger Skye, MD  [facility] Cardiologist:  None  [facility] Electrophysiologist:  Roger Manges, MD   Referring MD: Roger Skye, MD   Chief Complaint: 60-month follow-up  History of Present Illness:    Roger Sanchez is a 77 y.o. male with a hx of [name], prior CVA, asthma, bradycardia, GERD, HOH, hyperlipidemia, BPH, chronic low back pain, osteopenia who is being seen for hospital follow-up.   Patient presented to the [facility] ER after rapid response was called in the medical mall.  He was waiting for his wife was having imaging done with radiology.  Per the wife he is not feeling well.  He went downstairs to the cafeteria and came back up.  He had 1 episode where he almost passed out, he was very diaphoretic and pale, blood pressure was low.  Wife reported 2 previous episodes of near syncope that improved when he sat down and drink coffee.  He had previously been continued on Plavix due to history of CVA and TIA.  He denied any chest pain or shortness of breath.  The ER blood pressure was 152/104 pulse normal.  High-sensitivity troponin negative x 2.  BNP normal.  She was given IV fluids.  CT of the head and MRI of the brain was unchanged with no acute findings.  Cardiogram was ordered.  Echo showed normal LVEF with no wall motion abnormalities and mild AS.  Patient was bradycardiac throughout admission.  Plan was for treadmill test for atypical chest pain and heart monitor for bradycardia.  Myoview Lexiscan was low risk, no evidence of ischemia, EF of 47%, coronary calcifications noted in the LAD, left circumflex and RCA. Heart monitor is still pending.  She was seen in February 2024 and was overall doing well.  She reported persistent heart rates in the 30s.  Monitor showed sinus bradycardia with an average heart rate of 50 bpm, lowest heart rate of 30, 50  nonsustained SVT with the longest episode of 26.3 seconds, 14 pauses, longest 5.7 seconds, winky block conduction was present, rare supraventricular ectopy, occasional ventricular ectopy.  Patient was subsequently referred to EP.  Patient saw EP in March 2024 and a cardiac CTA was ordered.  Cardiac CTA in April 2024 showed coronary calcium score 754, 73rd percentile for age and sex matched, mild nonobstructive CAD 25 to 49%.  Patient was seen back in April 2024 and it was recommended he undergo loop recorder implantation in hopes of trying to identify a quality of life affecting arrhythmia.  Of life was discussed.  He was made DNR.  Today, the patient's wife reports he is tired a lot. He has a lot less stamina than before. He has not had any more syncope or pre-syncope. He felt very short of breath when trying to play basketball over the weekend. No chest pain. No lightheadedness or dizziness. ILR interrogation and heart monitor were reviewed.   Past Medical History:  Diagnosis Date   Arthralgia    shoulder    Asthma    Asymmetrical hearing loss    Atypical chest pain    Brachial plexus disorders    Bradycardia    Carpal tunnel syndrome    Cerebral infarction (HCC)    Closed fracture of wrist    Exposure to Agent Orange    GERD (gastroesophageal reflux disease)    HOH (hard of  hearing)    bilateral   Hyperlipidemia    Hypertrophy of prostate    benign    Low back pain    Lumbar spine pain    compression fracture    Malignant neoplasm (HCC)    skin    Metatarsalgia    Osteopenia    Plantar fibromatosis    Stroke [facility]) 77 yrs old   no residual effects   Transient ischemic attack    Trigger finger     Past Surgical History:  Procedure Laterality Date   BACK SURGERY     BROW LIFT Bilateral 03/26/2021   Procedure: BLEPHAROPLASTY UPPER EYELID; W/EXCESS SKIN BROW PTOSIS REPAIR  BLEPHAROPTOSIS REPAIR; RESECT EX BILATERAL;  Surgeon: Imagene Riches, MD;  Location: [facility];  Service: Ophthalmology;  Laterality: Bilateral;   WRIST SURGERY Right     Current Medications: Current Meds  Medication Sig   Albuterol Sulfate 108 (90 Base) MCG/ACT AEPB Inhale 2 puffs into the lungs QID.   ascorbic acid (VITAMIN C) 500 MG tablet Take 500 mg by mouth daily.   aspirin EC 81 MG tablet Take 81 mg by mouth daily.   atorvastatin (LIPITOR) 20 MG tablet Take 10 mg by mouth at bedtime.   clopidogrel (PLAVIX) 75 MG tablet Take 75 mg by mouth daily.   famotidine (PEPCID) 20 MG tablet Take 20 mg by mouth 2 (two) times daily.   glycopyrrolate (ROBINUL) 1 MG tablet Take 1 mg by mouth daily at 6 (six) AM.   GUAIFENESIN CR PO Take by mouth.   Multiple Vitamins-Minerals (MULTI-VITAMIN/MINERALS PO) Take by mouth.   naproxen (NAPROSYN) 500 MG tablet Take 500 mg by mouth 2 (two) times daily with a meal.   pantoprazole (PROTONIX) 40 MG tablet Take 40 mg by mouth 2 (two) times daily.   sildenafil (VIAGRA) 100 MG tablet Take 100 mg by mouth daily as needed for erectile dysfunction.   tamsulosin (FLOMAX) 0.4 MG CAPS capsule Take 0.4 mg by mouth.     Allergies:   Barley grass, Corn oil, Corn-containing products, Malt, Milk (cow), Milk-related compounds, Peanut-containing drug products, Simvastatin, and Wheat   Social History   Socioeconomic History   Marital status: Married    Spouse name: Not on file   Number of children: Not on file   Years of education: Not on file   Highest education level: Not on file  Occupational History   Not on file  Tobacco Use   Smoking status: Never    Passive exposure: Never   Smokeless tobacco: Never  Vaping Use   Vaping Use: Never used  Substance and Sexual Activity   Alcohol use: Never   Drug use: Never   Sexual activity: Not Currently  Other Topics Concern   Not on file  Social History Narrative   Not on file   Social Determinants of Health   Financial Resource Strain: Not on file  Food Insecurity: No Food Insecurity (12/13/2022)    Hunger Vital Sign    Worried About Running Out of Food in the Last Year: Never true    Ran Out of Food in the Last Year: Never true  Transportation Needs: No Transportation Needs (12/13/2022)   PRAPARE - Administrator, Civil Service (Medical): No    Lack of Transportation (Non-Medical): No  Physical Activity: Not on file  Stress: Not on file  Social Connections: Not on file     Family History: The patient's family history includes Heart Problems in  his father; Heart attack in his paternal aunt and paternal uncle.  ROS:   Please see the history of present illness.     All other systems reviewed and are negative.  EKGs/Labs/Other Studies Reviewed:    The following studies were reviewed today:  Cardiac CTA 02/2023   IMPRESSION: 1. Coronary calcium score of 754. This was 73rd percentile for age and sex matched control.   2. Normal coronary origin with right dominance.   3. Mild proximal LAD and LCx stenosis (25-49%).   4. Minimal proximal LCx stenosis (<25%).   5. CAD-RADS 2. Mild non-obstructive CAD (25-49%). Consider preventive therapy and risk factor modification.   Electronically Signed: By: Debbe Odea M.D. On: 02/13/2023 13:29  Heart monitor 01/2023 Study Highlights  HR 30 - 162 bpm, average 51 bpm. 1 nonsustained VT lasting 6 beats. 15 nonsustained SVT, longest 26.3 seconds with an average rate of 90 bpm. 14 pauses, longest 5.7 seconds Wenckebach conduction present during the monitoring period. Rare supraventricular ectopy. Occasional ventricular ectopy, 1.2%.   Sheria Lang T. Lalla Brothers, MD, [facility], [facility] Cardiac Electrophysiology    Myoview lexiscan 12/2022    The study is low risk.   No ST deviation was noted.   LV perfusion is normal. There is no evidence of ischemia.   Left ventricular function is normal visually. Calculated LVEF is 47%. correlation with echo advised.   Coronary calcifications noted in the LAD, LCx and RCA.  Echo 12/2022 1.  Left ventricular ejection fraction, by estimation, is 60 to 65%. The  left ventricle has normal function. The left ventricle has no regional  wall motion abnormalities. Left ventricular diastolic parameters were  normal.   2. Right ventricular systolic function is normal. The right ventricular  size is normal. There is mildly elevated pulmonary artery systolic  pressure.   3. Left atrial size was mildly dilated.   4. The mitral valve is normal in structure. No evidence of mitral valve  regurgitation. No evidence of mitral stenosis.   5. Tricuspid valve regurgitation is moderate.   6. The aortic valve is tricuspid. There is mild calcification of the  aortic valve. There is moderate thickening of the aortic valve. Aortic  valve regurgitation is not visualized. Mild aortic valve stenosis. Aortic  valve area, by VTI measures 1.59 cm.  Aortic valve mean gradient measures 15.0 mmHg.   7. The inferior vena cava is dilated in size with >50% respiratory  variability, suggesting right atrial pressure of 8 mmHg.   EKG:  EKG is not ordered today.    Recent Labs: 12/13/2022: B Natriuretic Peptide 47.1 12/14/2022: Hemoglobin 11.6; Platelets 117; TSH 1.037 01/13/2023: BUN 17; Creatinine, Ser 0.79; Potassium 3.9; Sodium 138  Recent Lipid Panel    Component Value Date/Time   CHOL 147 12/27/2022 1443   TRIG 50 12/27/2022 1443   HDL 61 12/27/2022 1443   CHOLHDL 2.4 12/27/2022 1443   VLDL 10 12/27/2022 1443   LDLCALC 76 12/27/2022 1443   LDLDIRECT 75 12/27/2022 1443     Physical Exam:    VS:  BP 122/76 (BP Location: Left Arm, Patient Position: Sitting, Cuff Size: Normal)   Pulse (!) 54   Ht 5\' 6"  (1.676 m)   Wt 174 lb (78.9 kg)   SpO2 99%   BMI 28.08 kg/m     Wt Readings from Last 3 Encounters:  03/30/23 174 lb (78.9 kg)  02/14/23 179 lb (81.2 kg)  01/12/23 177 lb 2 oz (80.3 kg)     GEN:  Well nourished, well developed in no acute distress HEENT: Normal NECK: No JVD; No carotid  bruits LYMPHATICS: No lymphadenopathy CARDIAC: bradycardia, RR, no murmurs, rubs, gallops RESPIRATORY:  Clear to auscultation without rales, wheezing or rhonchi  ABDOMEN: Soft, non-tender, non-distended MUSCULOSKELETAL:  No edema; No deformity  SKIN: Warm and dry NEUROLOGIC:  Alert and oriented x 3 PSYCHIATRIC:  Normal affect   ASSESSMENT:    1. Near syncope   2. Sinus bradycardia   3. Atypical chest pain   4. History of CVA (cerebrovascular accident)   5. Hyperlipidemia, mixed    PLAN:    In order of problems listed above:  Near Syncope Prior admission in 12/2022 for near syncope. Echo showed normal LVEF with moderate TR and mild AS. Heart rates was in the 30-40s and a heart monitor was ordered. Heart monitor showed SB with an average HR of 51bpm, 1 nonsustained VT lasting 6 beats, 15 runs of SVT longest 26.3 seconds, 14 pauses longest 5.7 seconds, winky block conduction was present.  Patient was referred to EP who ordered a cardiac CTA, which showed mild nonobstructive CAD.  He saw EP; it appears Dr. Graciela Husbands is not wanting to put in a pacemaker, unless absolutely necessary and we are confident symptoms are related to pauses.  A loop recorder was implanted.  Today, the patient reports no further syncope or presyncope.  He reports he is persistently tired and short of breath, symptoms seem worse than normal.  Discussed with EP APP, we will have him see Dr. Graciela Husbands next week for any further recommendations.  It was recommended patient not drive for 6 months after syncopal event.  Sinus bradycardia Heart monitor showed heart heart rate of 51 bpm.  He is not on rate lowering medications.  Plan as above.  Atypical chest pain Cardiac CTA showed mild nonobstructive CAD.  Patient denies any chest pain.  Continue aspirin 81 mg daily, Lipitor 20 mg daily, and Plavix 75 mg daily.  H/o CVA Continue ASA, Plavix, statin.  HLD LDL 75. Continue Lipitor 20mg  daily.   Disposition: Follow up in 3  month(s) with MD    Signed, Jaymes Hang David Stall, PA-C  03/30/2023 2:35 PM    [facility]

## 2023-03-31 DIAGNOSIS — Z95818 Presence of other cardiac implants and grafts: Secondary | ICD-10-CM | POA: Insufficient documentation

## 2023-04-04 ENCOUNTER — Encounter: Payer: Self-pay | Admitting: Internal Medicine

## 2023-04-04 ENCOUNTER — Ambulatory Visit: Payer: PPO | Attending: Internal Medicine | Admitting: Internal Medicine

## 2023-04-04 VITALS — BP 110/70 | HR 48 | Ht 66.0 in | Wt 175.0 lb

## 2023-04-04 DIAGNOSIS — I4729 Other ventricular tachycardia: Secondary | ICD-10-CM | POA: Diagnosis not present

## 2023-04-04 DIAGNOSIS — I471 Supraventricular tachycardia, unspecified: Secondary | ICD-10-CM | POA: Diagnosis not present

## 2023-04-04 DIAGNOSIS — R55 Syncope and collapse: Secondary | ICD-10-CM | POA: Diagnosis not present

## 2023-04-04 DIAGNOSIS — Z95818 Presence of other cardiac implants and grafts: Secondary | ICD-10-CM

## 2023-04-04 DIAGNOSIS — Z8673 Personal history of transient ischemic attack (TIA), and cerebral infarction without residual deficits: Secondary | ICD-10-CM | POA: Diagnosis not present

## 2023-04-04 NOTE — Progress Notes (Addendum)
Patient Care Team: Louanne Skye, MD as PCP - General (Family Medicine) Duke Salvia, MD as PCP - Electrophysiology (Cardiology)   HPI  Roger Sanchez is a 77 y.o. male seen in follow-up for spells following hospitalization 2/24.  He has a history of [name] and a series of different types of spells some which are extremely short and others more prolonged suggestive of a neurally mediated event; these occurred in the context of chronic bradycardia and in this regard his rivastigmine was discontinued because of concerns about bradycardia arrhythmias.  Moreover, it was not clear that the bradycardia was contributing to his functional limitations.  Notably but unrelated to decision making has been that recently was made DNR  The recommendation was to proceed with loop recorder implantation    History of chest pain concerning for angina.  Myoview scan was nonischemic, CTA undertaken 4/24 demonstrated no obstructive disease.    No interval episodes of lightheadedness or presyncope.  Seen today at the request of CF-PA because he got short of breath playing basketball with his grandsons which he has not done in decades.  Otherwise able to walk 2 to 3 miles twice a day.    DATE TEST EF    2/24 Echo  60-65%    2/24 Myoview     % No ischemia   4/24 CTA   Nonobstructive CAD     Date Cr K Hgb  2/24 0.9 3.9 11.6                 Records and Results Reviewed   Past Medical History:  Diagnosis Date   Arthralgia    shoulder    Asthma    Asymmetrical hearing loss    Atypical chest pain    Brachial plexus disorders    Bradycardia    Carpal tunnel syndrome    Cerebral infarction (HCC)    Closed fracture of wrist    Exposure to Agent Orange    GERD (gastroesophageal reflux disease)    HOH (hard of hearing)    bilateral   Hyperlipidemia    Hypertrophy of prostate    benign    Low back pain    Lumbar spine pain    compression fracture    Malignant  neoplasm (HCC)    skin    Metatarsalgia    Osteopenia    Plantar fibromatosis    Stroke [facility]) 77 yrs old   no residual effects   Transient ischemic attack    Trigger finger     Past Surgical History:  Procedure Laterality Date   BACK SURGERY     BROW LIFT Bilateral 03/26/2021   Procedure: BLEPHAROPLASTY UPPER EYELID; W/EXCESS SKIN BROW PTOSIS REPAIR  BLEPHAROPTOSIS REPAIR; RESECT EX BILATERAL;  Surgeon: Imagene Riches, MD;  Location: [facility];  Service: Ophthalmology;  Laterality: Bilateral;   WRIST SURGERY Right     Current Meds  Medication Sig   Albuterol Sulfate 108 (90 Base) MCG/ACT AEPB Inhale 2 puffs into the lungs QID.   ascorbic acid (VITAMIN C) 500 MG tablet Take 500 mg by mouth daily.   aspirin EC 81 MG tablet Take 81 mg by mouth daily.   atorvastatin (LIPITOR) 20 MG tablet Take 10 mg by mouth at bedtime.   clopidogrel (PLAVIX) 75 MG tablet Take 75 mg by mouth daily.   famotidine (PEPCID) 20 MG tablet Take 20 mg by mouth 2 (two) times daily.   glycopyrrolate (ROBINUL)  1 MG tablet Take 1 mg by mouth daily at 6 (six) AM.   GUAIFENESIN CR PO Take by mouth.   Multiple Vitamins-Minerals (MULTI-VITAMIN/MINERALS PO) Take by mouth.   naproxen (NAPROSYN) 500 MG tablet Take 500 mg by mouth 2 (two) times daily with a meal.   pantoprazole (PROTONIX) 40 MG tablet Take 40 mg by mouth 2 (two) times daily.   sildenafil (VIAGRA) 100 MG tablet Take 100 mg by mouth daily as needed for erectile dysfunction.   tamsulosin (FLOMAX) 0.4 MG CAPS capsule Take 0.4 mg by mouth.    Allergies  Allergen Reactions   Barley Grass    Corn Oil Other (See Comments)   Corn-Containing Products Other (See Comments)   Malt Other (See Comments)   Milk (Cow) Other (See Comments)   Milk-Related Compounds    Peanut-Containing Drug Products Other (See Comments)   Simvastatin Other (See Comments)    Muscle aches   Wheat       Review of Systems negative except from HPI and PMH  Physical  Exam BP 110/70   Pulse (!) 48   Ht 5\' 6"  (1.676 m)   Wt 175 lb (79.4 kg)   SpO2 98%   BMI 28.25 kg/m  Well developed and well nourished in no acute distress HENT normal Neck supple with JVP-flat Clear Device pocket well healed; without hematoma or erythema.  There is no tethering  Regular rate and rhythm, no  gallop   2/6 murmur Abd-soft with active BS No Clubbing cyanosis  edema Skin-warm and dry A & Oriented  Grossly normal sensory and motor function  ECG sinus at 48 ALT 20/08/45  Device function is normal. Programming changes none   See Paceart for details    Reviewed with patient and his wife.  Pauses noted.  About half of them are during sleeping hours; she notes that he sleeps a lot during the day they are also associated with gradual PP prolongation  CrCl cannot be calculated (Patient's most recent lab result is older than the maximum 21 days allowed.).   Assessment and  Plan  Lightheadedness suggestive of neurally mediated events   Presyncope brief onset offset suggestive of an arrhythmic event  Sinus bradycardia without certainty of chronotropic incompetence   Prolonged episode of weakness associate with diaphoresis and weakness question mechanism   [name]   SVT-nonsustained   VT-nonsustained   Cryptogenic stroke on DAPT  End-of-life discussions  No interval presyncopal events, I think the pauses are likely sleep related we will continue to follow.  Exercise tolerance remains quite good able to walk 2 to 3 miles a couple of times a day.  The concern of being short of breath playing basketball with his grandchildren I think is probably not immediately pertinent.  Remains on DAPT for his cryptogenic stroke      Current medicines are reviewed at length with the patient today .  The patient does not  have concerns regarding medicines.

## 2023-04-04 NOTE — Telephone Encounter (Signed)
Patient seen in office today suspect related to sleep

## 2023-04-04 NOTE — Patient Instructions (Signed)
Medication Instructions:  Your physician recommends that you continue on your current medications as directed. Please refer to the Current Medication list given to you today.  *If you need a refill on your cardiac medications before your next appointment, please call your pharmacy*   Lab Work: None ordered.  If you have labs (blood work) drawn today and your tests are completely normal, you will receive your results only by: MyChart Message (if you have MyChart) OR A paper copy in the mail If you have any lab test that is abnormal or we need to change your treatment, we will call you to review the results.   Testing/Procedures: None ordered.    Follow-Up: At [facility], you and your health needs are our priority.  As part of our continuing mission to provide you with exceptional heart care, we have created designated Provider Care Teams.  These Care Teams include your primary Cardiologist (physician) and Advanced Practice Providers (APPs -  Physician Assistants and Nurse Practitioners) who all work together to provide you with the care you need, when you need it.  We recommend signing up for the patient portal called "MyChart".  Sign up information is provided on this After Visit Summary.  MyChart is used to connect with patients for Virtual Visits (Telemedicine).  Patients are able to view lab/test results, encounter notes, upcoming appointments, etc.  Non-urgent messages can be sent to your provider as well.   To learn more about what you can do with MyChart, go to [email or web address].    Your next appointment:   6 months with Suzann Riddle, NP 

## 2023-04-14 ENCOUNTER — Telehealth: Payer: Self-pay

## 2023-04-14 NOTE — Telephone Encounter (Signed)
ILR alert for pause and symptom Pause occurred 6/6 @ 14:55, 5sec per device, appear 4.6sec on EGM - route to triage 1 symptom: Unusually tired all day:did not go walking today/EGM NSR LA, CVRS  Patient/wife report increase in weakness/fatigue. Difficulty performing daily walking routine becomes too fatigued, this has worsened since 5/28 OV.  Heart rates in the 40's-50's during daytime/activity. (See presenting and symptom EGM). 5 sec pause at 14:55 did occur during sleep.   *5/28 OV with Roger Sanchez: Sinus bradycardia without certainty of chronotropic incompetence Prolonged episode of weakness associate with diaphoresis and weakness question mechanism.

## 2023-04-18 NOTE — Progress Notes (Signed)
Carelink Summary Report / Loop Recorder 

## 2023-04-20 MED ORDER — FLECAINIDE ACETATE 50 MG PO TABS: 25.0000 mg | ORAL_TABLET | Freq: Two times a day (BID) | ORAL | 3 refills | Status: DC

## 2023-04-20 NOTE — Telephone Encounter (Signed)
Flecainide 25 bid for symptomatic PVCs with exercise  Notably on event recoder, half of the time he is in NSR but half of the time he was in sinus with frequent PVCs.  He has no known coronary disease based on a CTA 4/24  Will need to check electrocardiogram 5 days after starting

## 2023-04-21 NOTE — Telephone Encounter (Signed)
Spoke to the pt, explained Dr. Graciela Husbands recommendation:   -"Sent flecainide RX to [place].  Call Pt and see if he got medication and schedule a nurse visit 5 days after flecainide start"    Pt voiced understanding.

## 2023-04-24 ENCOUNTER — Ambulatory Visit (INDEPENDENT_AMBULATORY_CARE_PROVIDER_SITE_OTHER): Payer: PPO

## 2023-04-24 DIAGNOSIS — Z8673 Personal history of transient ischemic attack (TIA), and cerebral infarction without residual deficits: Secondary | ICD-10-CM | POA: Diagnosis not present

## 2023-04-24 LAB — CUP PACEART REMOTE DEVICE CHECK
Date Time Interrogation Session: 20240616034005
Implantable Pulse Generator Implant Date: 20240409
Zone Setting Status: 755011
Zone Setting Status: 755011
Zone Setting Status: 755011
Zone Setting Status: 755011

## 2023-04-24 NOTE — Telephone Encounter (Signed)
Did patient get scheduled for nurse visit w/EKG?

## 2023-04-27 NOTE — Telephone Encounter (Signed)
Spoke with wife today.  They are out of town and should be returning tomorrow.  They think it may be in the mail to start.  I have given patient's wife our direct device clinic number to make [place] aware when he receives the medications.  Wife verbalizes understanding that patient will need NV for EKG 5 days after starting medicine.  We will coordinate when they call us back.

## 2023-04-28 ENCOUNTER — Telehealth: Payer: Self-pay

## 2023-04-28 ENCOUNTER — Other Ambulatory Visit: Payer: Self-pay

## 2023-04-28 MED ORDER — FLECAINIDE ACETATE 50 MG PO TABS: 25.0000 mg | ORAL_TABLET | Freq: Two times a day (BID) | ORAL | 0 refills | Status: DC

## 2023-04-28 NOTE — Telephone Encounter (Signed)
ILR alert for pause Event occurred 6/19 @ 19:07, duration 5sec, pt. inactive Pt. has been asymptomatic, route to triage for pause 5sec or > per protocol  LVM for patient to call device back for symptoms

## 2023-04-28 NOTE — Telephone Encounter (Signed)
Patients wife called back, and requested [facility] [place] as she has not received the medicine from the [place] mail order. Advised patients wife to call the pharmacy and ask about cost, patients wife is also going to call the [facility] medication mail to ask when the medicine would be delivered as it was ordered and receipt confirmed by pharmacy on 04/20/23

## 2023-04-28 NOTE — Telephone Encounter (Signed)
Pt is starting Flecainide tonight.  Will be due for EKG on Wednesday.  Attempted to schedule pt here at the [facility] but wife reports he is only seen in the [place] office.  Advised I will have Dr [place]essa  nurse to call her back on Tuesday to discuss scheduling f/u EKG.  Wife states understanding.

## 2023-04-28 NOTE — Telephone Encounter (Signed)
Spoke with patients wife regarding pause, she stated she doesn't recall patient any having symptoms, advised patients wife to call office back on Monday to make appointment to have EKG done, due to patient starting flecanide, she stated the medications hasn't yet arrived.

## 2023-04-28 NOTE — Telephone Encounter (Signed)
Patient wife called back returning call. Let patient know a nurse will return her call

## 2023-04-28 NOTE — Telephone Encounter (Signed)
The pt wife calling to schedule an EKG. I told her Dr. Graciela Husbands nurse will give them a call back.

## 2023-05-02 ENCOUNTER — Encounter: Payer: Self-pay | Admitting: Internal Medicine

## 2023-05-02 ENCOUNTER — Telehealth: Payer: Self-pay | Admitting: Internal Medicine

## 2023-05-02 NOTE — Telephone Encounter (Signed)
I called and spoke with the patient's wife. I have offered for the patient to come into the [facility] office tomorrow 05/03/23 at 1:30 pm for an EKG nurse visit.   Mrs. Bucholz voices understanding and is agreeable.

## 2023-05-02 NOTE — Telephone Encounter (Signed)
Pt c/o medication issue:  1. Name of Medication:  flecainide (TAMBOCOR) 50 MG tablet  2. How are you currently taking this medication (dosage and times per day)?   3. Are you having a reaction (difficulty breathing--STAT)?   4. What is your medication issue?   Patient's wife states patient was advised to have an EKG after being on Flecainide for 5 days. She states tomorrow will be his 5th day on Flecainide. Please advise.

## 2023-05-02 NOTE — Telephone Encounter (Signed)
Pt has been scheduled for ECG in [place] office on 05/03/2023.

## 2023-05-03 ENCOUNTER — Telehealth: Payer: Self-pay

## 2023-05-03 ENCOUNTER — Ambulatory Visit: Payer: No Typology Code available for payment source

## 2023-05-03 ENCOUNTER — Ambulatory Visit: Payer: PPO | Admitting: *Deleted

## 2023-05-03 VITALS — BP 120/66 | HR 55 | Ht 66.0 in | Wt 174.6 lb

## 2023-05-03 DIAGNOSIS — R001 Bradycardia, unspecified: Secondary | ICD-10-CM

## 2023-05-03 DIAGNOSIS — I471 Supraventricular tachycardia, unspecified: Secondary | ICD-10-CM | POA: Diagnosis not present

## 2023-05-03 DIAGNOSIS — Z95818 Presence of other cardiac implants and grafts: Secondary | ICD-10-CM | POA: Diagnosis not present

## 2023-05-03 DIAGNOSIS — I4729 Other ventricular tachycardia: Secondary | ICD-10-CM | POA: Diagnosis not present

## 2023-05-03 NOTE — Telephone Encounter (Signed)
Following alert received from [employer] received for ILR alert for symptom + pause, Faint  Not at all active  Head felt odd. Event occurred 6/24 @ 21:50, EGM shows SB, 3sec pause, 1 sinus beat, 5sec pause. There is an additional 6sec pause @ 19:20. Route to triage for correlating symptom.  Reviewed with Dr. Graciela Husbands. Dr. Graciela Husbands requested patient add onto his schedule 05/09/23 2 8:00 AM. Called and spoke to patient/wife who are agreeable to apt. Sent address to patient via mychart for directions.

## 2023-05-03 NOTE — Progress Notes (Signed)
   Nurse Visit   Date of Encounter: 05/03/2023 ID: Harveer, Sadler 1946/02/28, MRN [id number]  PCP:  Louanne Skye, MD   [facility] Providers Cardiologist:  None Electrophysiologist:  Sherryl Manges, MD      Visit Details   VS:  BP 120/66 (BP Location: Left Arm, Patient Position: Sitting, Cuff Size: Normal)   Pulse (!) 55   Ht 5\' 6"  (1.676 m)   Wt 174 lb 9.6 oz (79.2 kg)   SpO2 98%   BMI 28.18 kg/m  , BMI Body mass index is 28.18 kg/m.  Wt Readings from Last 3 Encounters:  05/03/23 174 lb 9.6 oz (79.2 kg)  04/04/23 175 lb (79.4 kg)  03/30/23 174 lb (78.9 kg)     Reason for visit: EKG Performed today: Performed EKG post flecainide 25 BID for 5 days Changes (medications, testing, etc.) : None ordered Length of Visit: 5 minutes    Medications Adjustments/Labs and Tests Ordered: Orders Placed This Encounter  Procedures   EKG 12-Lead   No orders of the defined types were placed in this encounter.    Signed, Lenor Derrick, RN  05/03/2023 2:25 PM

## 2023-05-08 DIAGNOSIS — R42 Dizziness and giddiness: Secondary | ICD-10-CM | POA: Insufficient documentation

## 2023-05-09 ENCOUNTER — Encounter: Payer: Self-pay | Admitting: Internal Medicine

## 2023-05-09 ENCOUNTER — Ambulatory Visit: Payer: PPO | Attending: Internal Medicine | Admitting: Internal Medicine

## 2023-05-09 VITALS — BP 110/68 | HR 95 | Ht 66.0 in | Wt 175.4 lb

## 2023-05-09 DIAGNOSIS — Z95818 Presence of other cardiac implants and grafts: Secondary | ICD-10-CM

## 2023-05-09 DIAGNOSIS — I4729 Other ventricular tachycardia: Secondary | ICD-10-CM | POA: Diagnosis not present

## 2023-05-09 DIAGNOSIS — R42 Dizziness and giddiness: Secondary | ICD-10-CM

## 2023-05-09 DIAGNOSIS — R55 Syncope and collapse: Secondary | ICD-10-CM | POA: Diagnosis not present

## 2023-05-09 DIAGNOSIS — I471 Supraventricular tachycardia, unspecified: Secondary | ICD-10-CM | POA: Diagnosis not present

## 2023-05-09 DIAGNOSIS — Z01812 Encounter for preprocedural laboratory examination: Secondary | ICD-10-CM

## 2023-05-09 MED ORDER — FLECAINIDE ACETATE 50 MG PO TABS: 25.0000 mg | ORAL_TABLET | Freq: Two times a day (BID) | ORAL | 3 refills | Status: DC

## 2023-05-09 NOTE — Progress Notes (Signed)
Patient Care Team: Louanne Skye, MD as PCP - General (Family Medicine) Duke Salvia, MD as PCP - Electrophysiology (Cardiology)   HPI  Roger Sanchez is a 76 y.o. male seen in follow-up for spells following hospitalization 2/24.  He has a history of [name] and a series of different types of spells some which are extremely short and others more prolonged suggestive of a neurally mediated event; these occurred in the context of chronic bradycardia and in this regard his rivastigmine was discontinued because of concerns about bradycardia arrhythmias.  Moreover, it was not clear that the bradycardia was contributing to his functional limitations.  Notably but unrelated to decision making has been that recently was made DNR  Flecainide for PVC and PACs; aspirin and Plavix for prior TIA, dating back at least 5 years.  The recommendation was to proceed with loop recorder implantation    Some DOE ( playing BB with his grandchildren)   No interval episodes of lightheadedness or presyncope.    History of chest pain concerning for angina.  Myoview scan was nonischemic, CTA undertaken 4/24 demonstrated no obstructive disease.     DATE TEST EF    2/24 Echo  60-65%    2/24 Myoview     % No ischemia   4/24 CTA   Nonobstructive CAD     Date Cr K Hgb  2/24 0.9 3.9 11.6                 Records and Results Reviewed   Past Medical History:  Diagnosis Date   Arthralgia    shoulder    Asthma    Asymmetrical hearing loss    Atypical chest pain    Brachial plexus disorders    Bradycardia    Carpal tunnel syndrome    Cerebral infarction (HCC)    Closed fracture of wrist    Exposure to Agent Orange    GERD (gastroesophageal reflux disease)    HOH (hard of hearing)    bilateral   Hyperlipidemia    Hypertrophy of prostate    benign    Low back pain    Lumbar spine pain    compression fracture    Malignant neoplasm (HCC)    skin    Metatarsalgia     Osteopenia    Plantar fibromatosis    Stroke [facility]) 77 yrs old   no residual effects   Transient ischemic attack    Trigger finger     Past Surgical History:  Procedure Laterality Date   BACK SURGERY     BROW LIFT Bilateral 03/26/2021   Procedure: BLEPHAROPLASTY UPPER EYELID; W/EXCESS SKIN BROW PTOSIS REPAIR  BLEPHAROPTOSIS REPAIR; RESECT EX BILATERAL;  Surgeon: Imagene Riches, MD;  Location: [facility];  Service: Ophthalmology;  Laterality: Bilateral;   WRIST SURGERY Right     Current Meds  Medication Sig   Albuterol Sulfate 108 (90 Base) MCG/ACT AEPB Inhale 2 puffs into the lungs QID.   ascorbic acid (VITAMIN C) 500 MG tablet Take 500 mg by mouth daily.   aspirin EC 81 MG tablet Take 81 mg by mouth daily.   atorvastatin (LIPITOR) 20 MG tablet Take 10 mg by mouth at bedtime.   clopidogrel (PLAVIX) 75 MG tablet Take 75 mg by mouth daily.   clotrimazole (LOTRIMIN) 1 % cream Apply topically. Apply small amount to toe nails at bedtime to treat toenail fungus   famotidine (PEPCID) 20 MG tablet Take  20 mg by mouth 2 (two) times daily.   GUAIFENESIN CR PO Take by mouth. prn   montelukast (SINGULAIR) 10 MG tablet Take 10 mg by mouth at bedtime. For asthma   Multiple Vitamins-Minerals (MULTI-VITAMIN/MINERALS PO) Take by mouth.   naproxen (NAPROSYN) 500 MG tablet Take 500 mg by mouth 2 (two) times daily with a meal.   pantoprazole (PROTONIX) 40 MG tablet Take 40 mg by mouth 2 (two) times daily.   tamsulosin (FLOMAX) 0.4 MG CAPS capsule Take 0.4 mg by mouth.   [DISCONTINUED] flecainide (TAMBOCOR) 50 MG tablet Take 0.5 tablets (25 mg total) by mouth 2 (two) times daily.   [DISCONTINUED] flecainide (TAMBOCOR) 50 MG tablet Take 0.5 tablets (25 mg total) by mouth 2 (two) times daily.    Allergies  Allergen Reactions   Barley Grass    Corn Oil Other (See Comments)   Corn-Containing Products Other (See Comments)   Malt Other (See Comments)   Milk (Cow) Other (See Comments)    Milk-Related Compounds    Peanut-Containing Drug Products Other (See Comments)   Simvastatin Other (See Comments)    Muscle aches   Wheat       Review of Systems negative except from HPI and PMH  Physical Exam BP 110/68   Pulse 95   Ht 5\' 6"  (1.676 m)   Wt 175 lb 6.4 oz (79.6 kg)   SpO2 (!) 54%   BMI 28.31 kg/m  Well developed and well nourished in no acute distress HENT normal Neck supple with JVP-flat Clear Device pocket well healed; without hematoma or erythema.  There is no tethering  Regular rate and rhythm, no murmur Abd-soft with active BS No Clubbing cyanosis  edema Skin-warm and dry A & Oriented  Grossly normal sensory and motor function  ECG sinus at 49 20/09/43  Device function is  normal.  Symptomatic sinus pause, 5 seconds, heart rate excursion limited to the low 70s   See Paceart for details     See Paceart for details       CrCl cannot be calculated (Patient's most recent lab result is older than the maximum 21 days allowed.).   Assessment and  Plan  Lightheadedness suggestive of neurally mediated events   Presyncope brief onset offset suggestive of an arrhythmic event  Sinus bradycardia with chronotropic incompetence   Prolonged episode of weakness associate with diaphoresis and weakness question mechanism   [name]   SVT-nonsustained   VT-nonsustained   Cryptogenic stroke on DAPT  End-of-life discussions  Interval presyncope.  Occurred while awake.  Sinus pause.  Also chronotropic incompetence.  Will proceed with pacing.  Removal of the Linq. The benefits and risks were reviewed including but not limited to death,  perforation, infection, lead dislodgement and device malfunction.  The patient understands agrees and is willing to proceed.  Continue flecainide for symptomatic PACs and PVCs.  At this juncture we will plan to discontinue aspirin and use clopidogrel as monotherapy.  For his TIA.  (Up-to-date) continue his  statin, there may be some justification to change him to rosuvastatin, less lipophilic statin      Current medicines are reviewed at length with the patient today .  The patient does not  have concerns regarding medicines.

## 2023-05-09 NOTE — Patient Instructions (Signed)
Medication Instructions:  Your physician recommends that you continue on your current medications as directed. Please refer to the Current Medication list given to you today.  *If you need a refill on your cardiac medications before your next appointment, please call your pharmacy*   Lab Work: None ordered. today If you have labs (blood work) drawn today and your tests are completely normal, you will receive your results only by: MyChart Message (if you have MyChart) OR A paper copy in the mail If you have any lab test that is abnormal or we need to change your treatment, we will call you to review the results.   Testing/Procedures:  Removal of loop implant  Your physician has recommended that you have a pacemaker inserted. A pacemaker is a small device that is placed under the skin of your chest or abdomen to help control abnormal heart rhythms. This device uses electrical pulses to prompt the heart to beat at a normal rate. Pacemakers are used to treat heart rhythms that are too slow. Wire (leads) are attached to the pacemaker that goes into the chambers of you heart. This is done in the hospital and usually requires and overnight stay. Please see the instruction sheet given to you today for more information.     Follow-Up: At [facility], you and your health needs are our priority.  As part of our continuing mission to provide you with exceptional heart care, we have created designated Provider Care Teams.  These Care Teams include your primary Cardiologist (physician) and Advanced Practice Providers (APPs -  Physician Assistants and Nurse Practitioners) who all work together to provide you with the care you need, when you need it.  We recommend signing up for the patient portal called "MyChart".  Sign up information is provided on this After Visit Summary.  MyChart is used to connect with patients for Virtual Visits (Telemedicine).  Patients are able to view lab/test results,  encounter notes, upcoming appointments, etc.  Non-urgent messages can be sent to your provider as well.   To learn more about what you can do with MyChart, go to [email or web address].    Your next appointment:   To be scheduled

## 2023-05-09 NOTE — H&P (View-Only) (Signed)
Patient Care Team: Louanne Skye, MD as PCP - General (Family Medicine) Duke Salvia, MD as PCP - Electrophysiology (Cardiology)   HPI  Roger Sanchez is a 77 y.o. male seen in follow-up for spells following hospitalization 2/24.  He has a history of [name] and a series of different types of spells some which are extremely short and others more prolonged suggestive of a neurally mediated event; these occurred in the context of chronic bradycardia and in this regard his rivastigmine was discontinued because of concerns about bradycardia arrhythmias.  Moreover, it was not clear that the bradycardia was contributing to his functional limitations.  Notably but unrelated to decision making has been that recently was made DNR  Flecainide for PVC and PACs; aspirin and Plavix for prior TIA, dating back at least 5 years.  The recommendation was to proceed with loop recorder implantation    Some DOE ( playing BB with his grandchildren)   No interval episodes of lightheadedness or presyncope.    History of chest pain concerning for angina.  Myoview scan was nonischemic, CTA undertaken 4/24 demonstrated no obstructive disease.     DATE TEST EF    2/24 Echo  60-65%    2/24 Myoview     % No ischemia   4/24 CTA   Nonobstructive CAD     Date Cr K Hgb  2/24 0.9 3.9 11.6                 Records and Results Reviewed   Past Medical History:  Diagnosis Date   Arthralgia    shoulder    Asthma    Asymmetrical hearing loss    Atypical chest pain    Brachial plexus disorders    Bradycardia    Carpal tunnel syndrome    Cerebral infarction (HCC)    Closed fracture of wrist    Exposure to Agent Orange    GERD (gastroesophageal reflux disease)    HOH (hard of hearing)    bilateral   Hyperlipidemia    Hypertrophy of prostate    benign    Low back pain    Lumbar spine pain    compression fracture    Malignant neoplasm (HCC)    skin    Metatarsalgia     Osteopenia    Plantar fibromatosis    Stroke [facility]) 77 yrs old   no residual effects   Transient ischemic attack    Trigger finger     Past Surgical History:  Procedure Laterality Date   BACK SURGERY     BROW LIFT Bilateral 03/26/2021   Procedure: BLEPHAROPLASTY UPPER EYELID; W/EXCESS SKIN BROW PTOSIS REPAIR  BLEPHAROPTOSIS REPAIR; RESECT EX BILATERAL;  Surgeon: Imagene Riches, MD;  Location: [facility];  Service: Ophthalmology;  Laterality: Bilateral;   WRIST SURGERY Right     Current Meds  Medication Sig   Albuterol Sulfate 108 (90 Base) MCG/ACT AEPB Inhale 2 puffs into the lungs QID.   ascorbic acid (VITAMIN C) 500 MG tablet Take 500 mg by mouth daily.   aspirin EC 81 MG tablet Take 81 mg by mouth daily.   atorvastatin (LIPITOR) 20 MG tablet Take 10 mg by mouth at bedtime.   clopidogrel (PLAVIX) 75 MG tablet Take 75 mg by mouth daily.   clotrimazole (LOTRIMIN) 1 % cream Apply topically. Apply small amount to toe nails at bedtime to treat toenail fungus   famotidine (PEPCID) 20 MG tablet Take  20 mg by mouth 2 (two) times daily.   GUAIFENESIN CR PO Take by mouth. prn   montelukast (SINGULAIR) 10 MG tablet Take 10 mg by mouth at bedtime. For asthma   Multiple Vitamins-Minerals (MULTI-VITAMIN/MINERALS PO) Take by mouth.   naproxen (NAPROSYN) 500 MG tablet Take 500 mg by mouth 2 (two) times daily with a meal.   pantoprazole (PROTONIX) 40 MG tablet Take 40 mg by mouth 2 (two) times daily.   tamsulosin (FLOMAX) 0.4 MG CAPS capsule Take 0.4 mg by mouth.   [DISCONTINUED] flecainide (TAMBOCOR) 50 MG tablet Take 0.5 tablets (25 mg total) by mouth 2 (two) times daily.   [DISCONTINUED] flecainide (TAMBOCOR) 50 MG tablet Take 0.5 tablets (25 mg total) by mouth 2 (two) times daily.    Allergies  Allergen Reactions   Barley Grass    Corn Oil Other (See Comments)   Corn-Containing Products Other (See Comments)   Malt Other (See Comments)   Milk (Cow) Other (See Comments)    Milk-Related Compounds    Peanut-Containing Drug Products Other (See Comments)   Simvastatin Other (See Comments)    Muscle aches   Wheat       Review of Systems negative except from HPI and PMH  Physical Exam BP 110/68   Pulse 95   Ht 5\' 6"  (1.676 m)   Wt 175 lb 6.4 oz (79.6 kg)   SpO2 (!) 54%   BMI 28.31 kg/m  Well developed and well nourished in no acute distress HENT normal Neck supple with JVP-flat Clear Device pocket well healed; without hematoma or erythema.  There is no tethering  Regular rate and rhythm, no murmur Abd-soft with active BS No Clubbing cyanosis  edema Skin-warm and dry A & Oriented  Grossly normal sensory and motor function  ECG sinus at 49 20/09/43  Device function is  normal.  Symptomatic sinus pause, 5 seconds, heart rate excursion limited to the low 70s   See Paceart for details     See Paceart for details       CrCl cannot be calculated (Patient's most recent lab result is older than the maximum 21 days allowed.).   Assessment and  Plan  Lightheadedness suggestive of neurally mediated events   Presyncope brief onset offset suggestive of an arrhythmic event  Sinus bradycardia with chronotropic incompetence   Prolonged episode of weakness associate with diaphoresis and weakness question mechanism   [name]   SVT-nonsustained   VT-nonsustained   Cryptogenic stroke on DAPT  End-of-life discussions  Interval presyncope.  Occurred while awake.  Sinus pause.  Also chronotropic incompetence.  Will proceed with pacing.  Removal of the Linq. The benefits and risks were reviewed including but not limited to death,  perforation, infection, lead dislodgement and device malfunction.  The patient understands agrees and is willing to proceed.  Continue flecainide for symptomatic PACs and PVCs.  At this juncture we will plan to discontinue aspirin and use clopidogrel as monotherapy.  For his TIA.  (Up-to-date) continue his  statin, there may be some justification to change him to rosuvastatin, less lipophilic statin      Current medicines are reviewed at length with the patient today .  The patient does not  have concerns regarding medicines.

## 2023-05-12 ENCOUNTER — Encounter: Payer: Self-pay | Admitting: Internal Medicine

## 2023-05-12 NOTE — Telephone Encounter (Signed)
Spoke with pt's wife, DPR and reviewed device instruction letter.  See letter for complete details.  Pt's wife verbalizes understanding and thanked [employer] fundraiser for the call.

## 2023-05-16 NOTE — Progress Notes (Signed)
Carelink Summary Report / Loop Recorder 

## 2023-05-18 ENCOUNTER — Ambulatory Visit: Payer: No Typology Code available for payment source | Admitting: Internal Medicine

## 2023-05-29 ENCOUNTER — Ambulatory Visit (INDEPENDENT_AMBULATORY_CARE_PROVIDER_SITE_OTHER): Payer: PPO

## 2023-05-29 DIAGNOSIS — R55 Syncope and collapse: Secondary | ICD-10-CM | POA: Diagnosis not present

## 2023-05-30 ENCOUNTER — Other Ambulatory Visit
Admission: RE | Admit: 2023-05-30 | Discharge: 2023-05-30 | Disposition: A | Discharge status: Home / Self Care | Payer: PPO | Source: Ambulatory Visit | Attending: Internal Medicine | Admitting: Internal Medicine

## 2023-05-30 DIAGNOSIS — I4729 Other ventricular tachycardia: Secondary | ICD-10-CM | POA: Diagnosis present

## 2023-05-30 DIAGNOSIS — Z95818 Presence of other cardiac implants and grafts: Secondary | ICD-10-CM | POA: Insufficient documentation

## 2023-05-30 DIAGNOSIS — I471 Supraventricular tachycardia, unspecified: Secondary | ICD-10-CM | POA: Diagnosis present

## 2023-05-30 DIAGNOSIS — R42 Dizziness and giddiness: Secondary | ICD-10-CM | POA: Diagnosis present

## 2023-05-30 DIAGNOSIS — R55 Syncope and collapse: Secondary | ICD-10-CM | POA: Diagnosis present

## 2023-05-30 DIAGNOSIS — Z01812 Encounter for preprocedural laboratory examination: Secondary | ICD-10-CM | POA: Diagnosis present

## 2023-05-30 LAB — CBC
HCT: 36.7 % — ABNORMAL LOW (ref 39.0–52.0)
Hemoglobin: 13.1 g/dL (ref 13.0–17.0)
MCH: 36.4 pg — ABNORMAL HIGH (ref 26.0–34.0)
MCHC: 35.7 g/dL (ref 30.0–36.0)
MCV: 101.9 fL — ABNORMAL HIGH (ref 80.0–100.0)
Platelets: 126 10*3/uL — ABNORMAL LOW (ref 150–400)
RBC: 3.6 MIL/uL — ABNORMAL LOW (ref 4.22–5.81)
RDW: 12.7 % (ref 11.5–15.5)
WBC: 4.2 10*3/uL (ref 4.0–10.5)
nRBC: 0 % (ref 0.0–0.2)

## 2023-05-30 LAB — CUP PACEART REMOTE DEVICE CHECK
Date Time Interrogation Session: 20240719230129
Implantable Pulse Generator Implant Date: 20240409

## 2023-05-30 LAB — BASIC METABOLIC PANEL
Anion gap: 6 (ref 5–15)
BUN: 15 mg/dL (ref 8–23)
CO2: 27 mmol/L (ref 22–32)
Calcium: 8.6 mg/dL — ABNORMAL LOW (ref 8.9–10.3)
Chloride: 106 mmol/L (ref 98–111)
Creatinine, Ser: 0.73 mg/dL (ref 0.61–1.24)
GFR, Estimated: >60 mL/min (ref >60)
Glucose, Bld: 116 mg/dL — ABNORMAL HIGH (ref 70–99)
Potassium: 3.7 mmol/L (ref 3.5–5.1)
Sodium: 139 mmol/L (ref 135–145)

## 2023-06-02 ENCOUNTER — Encounter: Payer: Self-pay | Admitting: Internal Medicine

## 2023-06-06 ENCOUNTER — Encounter: Payer: Self-pay | Admitting: Internal Medicine

## 2023-06-06 ENCOUNTER — Ambulatory Visit: Payer: PPO

## 2023-06-06 ENCOUNTER — Other Ambulatory Visit: Payer: Self-pay

## 2023-06-06 ENCOUNTER — Ambulatory Visit
Admission: RE | Admit: 2023-06-06 | Discharge: 2023-06-06 | Disposition: A | Discharge status: Home / Self Care | Payer: PPO | Source: Ambulatory Visit | Attending: Internal Medicine | Admitting: Internal Medicine

## 2023-06-06 ENCOUNTER — Encounter
Admission: RE | Disposition: A | Discharge status: Home / Self Care | Payer: Self-pay | Source: Ambulatory Visit | Attending: Internal Medicine

## 2023-06-06 DIAGNOSIS — I471 Supraventricular tachycardia, unspecified: Secondary | ICD-10-CM | POA: Diagnosis not present

## 2023-06-06 DIAGNOSIS — Z8673 Personal history of transient ischemic attack (TIA), and cerebral infarction without residual deficits: Secondary | ICD-10-CM | POA: Diagnosis not present

## 2023-06-06 DIAGNOSIS — I495 Sick sinus syndrome: Secondary | ICD-10-CM

## 2023-06-06 DIAGNOSIS — G459 Transient cerebral ischemic attack, unspecified: Secondary | ICD-10-CM | POA: Insufficient documentation

## 2023-06-06 DIAGNOSIS — R531 Weakness: Secondary | ICD-10-CM | POA: Insufficient documentation

## 2023-06-06 DIAGNOSIS — F0280 Dementia in other diseases classified elsewhere without behavioral disturbance: Secondary | ICD-10-CM | POA: Insufficient documentation

## 2023-06-06 DIAGNOSIS — R001 Bradycardia, unspecified: Secondary | ICD-10-CM | POA: Insufficient documentation

## 2023-06-06 DIAGNOSIS — Z66 Do not resuscitate: Secondary | ICD-10-CM | POA: Insufficient documentation

## 2023-06-06 DIAGNOSIS — Z79899 Other long term (current) drug therapy: Secondary | ICD-10-CM | POA: Diagnosis not present

## 2023-06-06 DIAGNOSIS — R61 Generalized hyperhidrosis: Secondary | ICD-10-CM | POA: Insufficient documentation

## 2023-06-06 DIAGNOSIS — I455 Other specified heart block: Secondary | ICD-10-CM

## 2023-06-06 DIAGNOSIS — G3183 Dementia with Lewy bodies: Secondary | ICD-10-CM | POA: Insufficient documentation

## 2023-06-06 HISTORY — PX: LOOP RECORDER REMOVAL: EP1215

## 2023-06-06 HISTORY — DX: Dementia in other diseases classified elsewhere, unspecified severity, without behavioral disturbance, psychotic disturbance, mood disturbance, and anxiety: F02.80

## 2023-06-06 HISTORY — PX: PACEMAKER IMPLANT: EP1218

## 2023-06-06 SURGERY — PACEMAKER IMPLANT
Anesthesia: Moderate Sedation

## 2023-06-06 MED ORDER — SODIUM CHLORIDE 0.9 % IV SOLN: INTRAVENOUS | Status: DC

## 2023-06-06 MED ORDER — CHLORHEXIDINE GLUCONATE 4 % EX SOLN
4.0000 | Freq: Once | CUTANEOUS | Status: AC
  Administered 2023-06-06: 4 via TOPICAL

## 2023-06-06 MED ORDER — CEFAZOLIN SODIUM-DEXTROSE 2-4 GM/100ML-% IV SOLN
2.0000 g | INTRAVENOUS | Status: AC
  Administered 2023-06-06: 2 g via INTRAVENOUS

## 2023-06-06 MED ORDER — CEFAZOLIN SODIUM-DEXTROSE 2-4 GM/100ML-% IV SOLN
INTRAVENOUS | Status: AC
  Filled 2023-06-06: qty 100

## 2023-06-06 MED ORDER — HEPARIN (PORCINE) IN NACL 1000-0.9 UT/500ML-% IV SOLN
INTRAVENOUS | Status: DC | PRN
  Administered 2023-06-06: 500 mL

## 2023-06-06 MED ORDER — FENTANYL CITRATE (PF) 100 MCG/2ML IJ SOLN
INTRAMUSCULAR | Status: AC
  Filled 2023-06-06: qty 2

## 2023-06-06 MED ORDER — MIDAZOLAM HCL 2 MG/2ML IJ SOLN
INTRAMUSCULAR | Status: AC
  Filled 2023-06-06: qty 2

## 2023-06-06 MED ORDER — FENTANYL CITRATE (PF) 100 MCG/2ML IJ SOLN
INTRAMUSCULAR | Status: DC | PRN
  Administered 2023-06-06: 12.5 ug via INTRAVENOUS

## 2023-06-06 MED ORDER — ACETAMINOPHEN 325 MG PO TABS: 325.0000 mg | ORAL_TABLET | ORAL | Status: DC | PRN

## 2023-06-06 MED ORDER — HEPARIN (PORCINE) IN NACL 1000-0.9 UT/500ML-% IV SOLN
INTRAVENOUS | Status: AC
  Filled 2023-06-06: qty 500

## 2023-06-06 MED ORDER — ONDANSETRON HCL 4 MG/2ML IJ SOLN: 4.0000 mg | Freq: Four times a day (QID) | INTRAMUSCULAR | Status: DC | PRN

## 2023-06-06 MED ORDER — LIDOCAINE HCL (PF) 1 % IJ SOLN
INTRAMUSCULAR | Status: DC | PRN
  Administered 2023-06-06: 30 mL

## 2023-06-06 MED ORDER — IOHEXOL 300 MG/ML  SOLN
INTRAMUSCULAR | Status: DC | PRN
  Administered 2023-06-06: 20 mL

## 2023-06-06 MED ORDER — MIDAZOLAM HCL 2 MG/2ML IJ SOLN
INTRAMUSCULAR | Status: DC | PRN
  Administered 2023-06-06: .5 mg via INTRAVENOUS

## 2023-06-06 MED ORDER — LIDOCAINE HCL 1 % IJ SOLN
INTRAMUSCULAR | Status: AC
  Filled 2023-06-06: qty 60

## 2023-06-06 MED ORDER — SODIUM CHLORIDE 0.9 % IV SOLN
80.0000 mg | INTRAVENOUS | Status: AC
  Administered 2023-06-06: 80 mg
  Filled 2023-06-06: qty 2

## 2023-06-06 SURGICAL SUPPLY — 19 items
ADH SKN CLS APL DERMABOND .7 (GAUZE/BANDAGES/DRESSINGS) ×1
CABLE SURG 12 DISP A/V CHANNEL (MISCELLANEOUS) IMPLANT
DERMABOND ADVANCED .7 DNX12 (GAUZE/BANDAGES/DRESSINGS) IMPLANT
DEVICE DSSCT PLSMBLD 3.0S LGHT (MISCELLANEOUS) IMPLANT
DRAPE INCISE IOBAN 66X45 STRL (DRAPES) IMPLANT
ELECT REM PT RETURN 9FT ADLT (ELECTROSURGICAL) ×1
ELECTRODE REM PT RTRN 9FT ADLT (ELECTROSURGICAL) IMPLANT
IPG PACE AZUR XT DR MRI W1DR01 (Pacemaker) IMPLANT
LEAD CAPSURE NOVUS 5076-52CM (Lead) IMPLANT
LEAD CAPSURE NOVUS 5076-58CM (Lead) IMPLANT
PACE AZURE XT DR MRI W1DR01 (Pacemaker) ×1 IMPLANT
PAD ELECT DEFIB RADIOL ZOLL (MISCELLANEOUS) IMPLANT
PLASMABLADE 3.0S W/LIGHT (MISCELLANEOUS) ×1
SHEATH 7FR PRELUDE SNAP 13 (SHEATH) IMPLANT
SLING ARM IMMOBILIZER LRG (SOFTGOODS) IMPLANT
SUT SILK 2 0 SH CR/8 (SUTURE) IMPLANT
SUT VIC AB 2-0 CT2 27 (SUTURE) IMPLANT
SUT VIC AB 3-0 X1 27 (SUTURE) IMPLANT
TRAY PACEMAKER INSERTION (PACKS) ×1 IMPLANT

## 2023-06-06 NOTE — Interval H&P Note (Signed)
History and Physical Interval Note:  06/06/2023 7:19 AM  Roger Sanchez  has presented today for surgery, with the diagnosis of Pacemaker Implant   Loop Removal    [employer]  Dual   Sinus pause.  The various methods of treatment have been discussed with the patient and family. After consideration of risks, benefits and other options for treatment, the patient has consented to  Procedure(s): PACEMAKER IMPLANT (N/A) LOOP RECORDER REMOVAL (N/A) as a surgical intervention.  The patient's history has been reviewed, patient examined, no change in status, stable for surgery.  I have reviewed the patient's chart and labs.  Questions were answered to the patient's satisfaction.     Sherryl Manges

## 2023-06-07 ENCOUNTER — Encounter: Payer: Self-pay | Admitting: Internal Medicine

## 2023-06-09 ENCOUNTER — Telehealth: Payer: Self-pay

## 2023-06-09 NOTE — Telephone Encounter (Signed)
Call received from Pt.  Pt wanted to know if he was to continue flecainide post pacemaker implant.  Advised Pt to continue all medications as prescribed at this time.  No further questions.

## 2023-06-16 NOTE — Progress Notes (Signed)
Carelink Summary Report / Loop Recorder 

## 2023-06-21 ENCOUNTER — Ambulatory Visit: Payer: No Typology Code available for payment source | Attending: Internal Medicine

## 2023-06-21 DIAGNOSIS — R001 Bradycardia, unspecified: Secondary | ICD-10-CM

## 2023-06-21 LAB — CUP PACEART INCLINIC DEVICE CHECK
Battery Remaining Longevity: 147 mo
Battery Voltage: 3.18 V
Brady Statistic AP VP Percent: 0.27 %
Brady Statistic AP VS Percent: 78.85 %
Brady Statistic AS VP Percent: 0 %
Brady Statistic AS VS Percent: 20.88 %
Brady Statistic RA Percent Paced: 79.53 %
Brady Statistic RV Percent Paced: 0.27 %
Date Time Interrogation Session: 20240814131147
Implantable Lead Connection Status: 753985
Implantable Lead Connection Status: 753985
Implantable Lead Implant Date: 20240730
Implantable Lead Implant Date: 20240730
Implantable Lead Location: 753859
Implantable Lead Location: 753860
Implantable Lead Model: 5076
Implantable Lead Model: 5076
Implantable Pulse Generator Implant Date: 20240730
Lead Channel Impedance Value: 342 Ohm
Lead Channel Impedance Value: 399 Ohm
Lead Channel Impedance Value: 418 Ohm
Lead Channel Impedance Value: 456 Ohm
Lead Channel Pacing Threshold Amplitude: 0.5 V
Lead Channel Pacing Threshold Amplitude: 0.625 V
Lead Channel Pacing Threshold Pulse Width: 0.4 ms
Lead Channel Pacing Threshold Pulse Width: 0.4 ms
Lead Channel Sensing Intrinsic Amplitude: 0.875 mV
Lead Channel Sensing Intrinsic Amplitude: 11.625 mV
Lead Channel Sensing Intrinsic Amplitude: 11.875 mV
Lead Channel Sensing Intrinsic Amplitude: 2.25 mV
Lead Channel Setting Pacing Amplitude: 3.5 V
Lead Channel Setting Pacing Amplitude: 3.5 V
Lead Channel Setting Pacing Pulse Width: 0.4 ms
Lead Channel Setting Sensing Sensitivity: 1.2 mV
Zone Setting Status: 755011

## 2023-06-21 NOTE — Progress Notes (Signed)

## 2023-06-21 NOTE — Patient Instructions (Signed)

## 2023-06-29 ENCOUNTER — Telehealth: Payer: Self-pay

## 2023-06-29 NOTE — Telephone Encounter (Signed)
Remote transmission received for ILR, patient has ppm. Can someone look into this?

## 2023-06-30 NOTE — Telephone Encounter (Signed)
His ILR  account was not taken out of [employer] when he was explanted. His monitor been disconnected since 06-06-2023. There is no new data on the loop since 06-06-2023.

## 2023-07-06 ENCOUNTER — Ambulatory Visit: Payer: PPO | Attending: Cardiovascular Disease | Admitting: Cardiovascular Disease

## 2023-07-06 ENCOUNTER — Encounter: Payer: Self-pay | Admitting: Cardiovascular Disease

## 2023-07-06 VITALS — BP 107/70 | HR 60 | Ht 66.0 in | Wt 174.2 lb

## 2023-07-06 DIAGNOSIS — R001 Bradycardia, unspecified: Secondary | ICD-10-CM | POA: Diagnosis not present

## 2023-07-06 DIAGNOSIS — I471 Supraventricular tachycardia, unspecified: Secondary | ICD-10-CM

## 2023-07-06 DIAGNOSIS — R42 Dizziness and giddiness: Secondary | ICD-10-CM | POA: Diagnosis not present

## 2023-07-06 DIAGNOSIS — I2510 Atherosclerotic heart disease of native coronary artery without angina pectoris: Secondary | ICD-10-CM | POA: Diagnosis not present

## 2023-07-06 NOTE — Progress Notes (Signed)
Cardiology Office Note   Date:  07/06/2023   ID:  Revel, Hepper 1945/11/21, MRN [id number]  PCP:  Louanne Skye, MD  Cardiologist:   Lorine Bears, MD   Chief Complaint  Patient presents with   3 month follow up     Patient c/o tiredness, fluctuating blood pressure and dizziness from sitting to standing. Medications reviewed by the patient verbally.       History of Present Illness: Roger Sanchez is a 77 y.o. male who presents for a follow-up visit regarding bradycardia status post pacemaker placement. He has known history of [name], GERD, hyperlipidemia and previous stroke. He was seen by me in 2024 bradycardia and exertional shortness of breath.  Had an echocardiogram done which showed normal LV systolic function with mild aortic stenosis.  He was hospitalized this year at [facility] with presyncope.  Echocardiogram showed normal LV systolic function with stable mild aortic stenosis.  He was noted to be bradycardic.  Lexiscan Myoview showed no evidence of ischemia.  He was subsequently seen by Dr. Graciela Husbands.  Cardiac CTA was performed in April of this year which showed a calcium score of 754 with mild nonobstructive LAD and left circumflex disease.  He had a loop recorder placed.  He was found to have frequent PVCs and PACs in addition to intermittent bradycardia.  He was placed on small dose of flecainide. He underwent dual-chamber pacemaker placement on July 30 by Dr. Graciela Husbands.  The patient felt better after that with improved stamina and exercise capacity.  He continues to have orthostatic dizziness and he is mildly orthostatic by blood pressure today in the office.  No chest pain.    Past Medical History:  Diagnosis Date   Arthralgia    shoulder    Asthma    Asymmetrical hearing loss    Atypical chest pain    Brachial plexus disorders    Bradycardia    Carpal tunnel syndrome    Cerebral infarction (HCC)    Closed fracture of wrist    Exposure to Agent  Orange    GERD (gastroesophageal reflux disease)    HOH (hard of hearing)    bilateral   Hyperlipidemia    Hypertrophy of prostate    benign    [name] (HCC)    Low back pain    Lumbar spine pain    compression fracture    Malignant neoplasm (HCC)    skin    Metatarsalgia    Osteopenia    Plantar fibromatosis    Stroke [facility]) 77 yrs old   no residual effects   Transient ischemic attack    Trigger finger     Past Surgical History:  Procedure Laterality Date   BACK SURGERY     BROW LIFT Bilateral 03/26/2021   Procedure: BLEPHAROPLASTY UPPER EYELID; W/EXCESS SKIN BROW PTOSIS REPAIR  BLEPHAROPTOSIS REPAIR; RESECT EX BILATERAL;  Surgeon: Imagene Riches, MD;  Location: [facility];  Service: Ophthalmology;  Laterality: Bilateral;   LOOP RECORDER REMOVAL N/A 06/06/2023   Procedure: LOOP RECORDER REMOVAL;  Surgeon: Duke Salvia, MD;  Location: [facility] INVASIVE CV LAB;  Service: Cardiovascular;  Laterality: N/A;   PACEMAKER IMPLANT N/A 06/06/2023   Procedure: PACEMAKER IMPLANT;  Surgeon: Duke Salvia, MD;  Location[facility] INVAS[facility] CV LAB;  Service: Cardiovascular;  Laterality: N/A;   WRIST SURGERY Right      Current Outpatient Medications  Medication Sig Dispense Refill   Albuterol Sulfate 108 (90  Base) MCG/ACT AEPB Inhale 2 puffs into the lungs QID.     ascorbic acid (VITAMIN C) 500 MG tablet Take 500 mg by mouth daily.     aspirin EC 81 MG tablet Take 81 mg by mouth daily.     atorvastatin (LIPITOR) 20 MG tablet Take 10 mg by mouth at bedtime.     clopidogrel (PLAVIX) 75 MG tablet Take 75 mg by mouth daily.     clotrimazole (LOTRIMIN) 1 % cream Apply topically. Apply small amount to toe nails at bedtime to treat toenail fungus     famotidine (PEPCID) 20 MG tablet Take 20 mg by mouth 2 (two) times daily.     flecainide (TAMBOCOR) 50 MG tablet Take 0.5 tablets (25 mg total) by mouth 2 (two) times daily. 90 tablet 3   montelukast (SINGULAIR) 10 MG tablet Take 10 mg  by mouth at bedtime. For asthma     Multiple Vitamins-Minerals (MULTI-VITAMIN/MINERALS PO) Take by mouth.     naproxen (NAPROSYN) 500 MG tablet Take 500 mg by mouth 2 (two) times daily with a meal.     pantoprazole (PROTONIX) 40 MG tablet Take 40 mg by mouth 2 (two) times daily.     tamsulosin (FLOMAX) 0.4 MG CAPS capsule Take 0.4 mg by mouth.     GUAIFENESIN CR PO Take by mouth. prn (Patient not taking: Reported on 07/06/2023)     No current facility-administered medications for this visit.    Allergies:   Barley grass, Corn oil, Corn-containing products, Malt, Milk (cow), Milk-related compounds, Peanut-containing drug products, Simvastatin, and Wheat    Social History:  The patient  reports that he has never smoked. He has never been exposed to tobacco smoke. He has never used smokeless tobacco. He reports that he does not drink alcohol and does not use drugs.   Family History:  The patient's family history includes Heart Problems in his father; Heart attack in his paternal aunt and paternal uncle.    ROS:  Please see the history of present illness.   Otherwise, review of systems are positive for none.   All other systems are reviewed and negative.    PHYSICAL EXAM: VS:  BP 107/70 (BP Location: Left Arm, Patient Position: Sitting, Cuff Size: Normal)   Pulse 60   Ht 5\' 6"  (1.676 m)   Wt 174 lb 4 oz (79 kg)   SpO2 98%   BMI 28.12 kg/m  , BMI Body mass index is 28.12 kg/m. GEN: Well nourished, well developed, in no acute distress  HEENT: normal  Neck: no JVD, carotid bruits, or masses Cardiac: RRR; no murmurs, rubs, or gallops,no edema  Respiratory:  clear to auscultation bilaterally, normal work of breathing GI: soft, nontender, nondistended, + BS MS: no deformity or atrophy  Skin: warm and dry, no rash Neuro:  Strength and sensation are intact Psych: euthymic mood, full affect   EKG:  EKG is ordered today. The ekg ordered today demonstrates : Atrial-paced rhythm with  prolonged AV conduction Nonspecific T wave abnormality       Recent Labs: 12/13/2022: B Natriuretic Peptide 47.1 12/14/2022: TSH 1.037 05/30/2023: BUN 15; Creatinine, Ser 0.73; Hemoglobin 13.1; Platelets 126; Potassium 3.7; Sodium 139    Lipid Panel    Component Value Date/Time   CHOL 147 12/27/2022 1443   TRIG 50 12/27/2022 1443   HDL 61 12/27/2022 1443   CHOLHDL 2.4 12/27/2022 1443   VLDL 10 12/27/2022 1443   LDLCALC 76 12/27/2022 1443   LDLDIRECT 75  12/27/2022 1443      Wt Readings from Last 3 Encounters:  07/06/23 174 lb 4 oz (79 kg)  06/06/23 170 lb (77.1 kg)  05/09/23 175 lb 6.4 oz (79.6 kg)         12/20/2018    2:03 PM  PAD Screen  Previous PAD dx? No  Previous surgical procedure? No  Pain with walking? No  Feet/toe relief with dangling? No  Painful, non-healing ulcers? No  Extremities discolored? No      ASSESSMENT AND PLAN:  1.  Status post permanent pacemaker placement for bradycardia: Improved symptoms of dizziness and exercise capacity.  2.  Orthostatic dizziness: Likely due to orthostatic hypotension as documented today.  Symptoms are not severe enough to require medication at this point.  I advised him to ensure adequate oral hydration and salt intake.  Avoid quick standing up.  3.  Frequent PVCs and PACs: Currently on small dose flecainide twice daily.  4.  Coronary artery disease involving native coronary arteries without angina: Cardiac CTA showed mild nonobstructive disease.  Currently with no anginal symptoms.  Continue aspirin and atorvastatin.  He is also on clopidogrel given previous stroke.  5.  Hyperlipidemia: Continue atorvastatin with a target LDL of less than 70.    Disposition:   FU with me in 6 months  Signed,  Lorine Bears, MD  07/06/2023 10:51 AM    [facility]

## 2023-07-06 NOTE — Patient Instructions (Signed)
Medication Instructions:  No changes *If you need a refill on your cardiac medications before your next appointment, please call your pharmacy*   Lab Work: None ordered If you have labs (blood work) drawn today and your tests are completely normal, you will receive your results only by: MyChart Message (if you have MyChart) OR A paper copy in the mail If you have any lab test that is abnormal or we need to change your treatment, we will call you to review the results.   Testing/Procedures: None ordered   Follow-Up: At [facility], you and your health needs are our priority.  As part of our continuing mission to provide you with exceptional heart care, we have created designated Provider Care Teams.  These Care Teams include your primary Cardiologist (physician) and Advanced Practice Providers (APPs -  Physician Assistants and Nurse Practitioners) who all work together to provide you with the care you need, when you need it.  We recommend signing up for the patient portal called "MyChart".  Sign up information is provided on this After Visit Summary.  MyChart is used to connect with patients for Virtual Visits (Telemedicine).  Patients are able to view lab/test results, encounter notes, upcoming appointments, etc.  Non-urgent messages can be sent to your provider as well.   To learn more about what you can do with MyChart, go to [email or web address].    Your next appointment:   6 month(s)  Provider:   You may see Dr. Arida or one of the following Advanced Practice Providers on your designated Care Team:   Christopher Berge, NP Ryan Dunn, PA-C Cadence Furth, PA-C Sheri Hammock, NP    

## 2023-09-04 ENCOUNTER — Telehealth: Payer: Self-pay | Admitting: Internal Medicine

## 2023-09-04 NOTE — Telephone Encounter (Signed)
I told them that the device clinic did not call them. The pt is not due for a transmission until 09/05/2023.

## 2023-09-04 NOTE — Telephone Encounter (Signed)
Wife calling to speak to the nurse in regards to the patient. Please advise

## 2023-09-04 NOTE — Telephone Encounter (Signed)
The patient's wife called, stating that she received a voicemail from our office indicating that we are not receiving transmissions from the patient's pacemaker. I will forward this information to the device clinic.

## 2023-09-05 ENCOUNTER — Ambulatory Visit (INDEPENDENT_AMBULATORY_CARE_PROVIDER_SITE_OTHER): Payer: PPO

## 2023-09-05 DIAGNOSIS — R55 Syncope and collapse: Secondary | ICD-10-CM

## 2023-09-06 LAB — CUP PACEART REMOTE DEVICE CHECK
Battery Remaining Longevity: 146 mo
Battery Voltage: 3.14 V
Brady Statistic AP VP Percent: 0.22 %
Brady Statistic AP VS Percent: 73.15 %
Brady Statistic AS VP Percent: 0.01 %
Brady Statistic AS VS Percent: 26.62 %
Brady Statistic RA Percent Paced: 73.78 %
Brady Statistic RV Percent Paced: 0.23 %
Date Time Interrogation Session: 20241029100634
Implantable Lead Connection Status: 753985
Implantable Lead Connection Status: 753985
Implantable Lead Implant Date: 20240730
Implantable Lead Implant Date: 20240730
Implantable Lead Location: 753859
Implantable Lead Location: 753860
Implantable Lead Model: 5076
Implantable Lead Model: 5076
Implantable Pulse Generator Implant Date: 20240730
Lead Channel Impedance Value: 304 Ohm
Lead Channel Impedance Value: 380 Ohm
Lead Channel Impedance Value: 418 Ohm
Lead Channel Impedance Value: 437 Ohm
Lead Channel Pacing Threshold Amplitude: 0.5 V
Lead Channel Pacing Threshold Amplitude: 0.625 V
Lead Channel Pacing Threshold Pulse Width: 0.4 ms
Lead Channel Pacing Threshold Pulse Width: 0.4 ms
Lead Channel Sensing Intrinsic Amplitude: 0.875 mV
Lead Channel Sensing Intrinsic Amplitude: 0.875 mV
Lead Channel Sensing Intrinsic Amplitude: 11.375 mV
Lead Channel Sensing Intrinsic Amplitude: 11.375 mV
Lead Channel Setting Pacing Amplitude: 3.5 V
Lead Channel Setting Pacing Amplitude: 3.5 V
Lead Channel Setting Pacing Pulse Width: 0.4 ms
Lead Channel Setting Sensing Sensitivity: 1.2 mV
Zone Setting Status: 755011

## 2023-09-07 ENCOUNTER — Encounter: Payer: Self-pay | Admitting: Internal Medicine

## 2023-09-07 ENCOUNTER — Ambulatory Visit: Payer: PPO | Attending: Internal Medicine | Admitting: Internal Medicine

## 2023-09-07 VITALS — BP 102/64 | HR 60 | Ht 66.0 in | Wt 172.2 lb

## 2023-09-07 DIAGNOSIS — R001 Bradycardia, unspecified: Secondary | ICD-10-CM

## 2023-09-07 DIAGNOSIS — I471 Supraventricular tachycardia, unspecified: Secondary | ICD-10-CM | POA: Diagnosis not present

## 2023-09-07 LAB — CUP PACEART INCLINIC DEVICE CHECK
Date Time Interrogation Session: 20241031131817
Implantable Lead Connection Status: 753985
Implantable Lead Connection Status: 753985
Implantable Lead Implant Date: 20240730
Implantable Lead Implant Date: 20240730
Implantable Lead Location: 753859
Implantable Lead Location: 753860
Implantable Lead Model: 5076
Implantable Lead Model: 5076
Implantable Pulse Generator Implant Date: 20240730

## 2023-09-07 LAB — PACEMAKER DEVICE OBSERVATION

## 2023-09-07 NOTE — Progress Notes (Signed)
Patient Care Team: Louanne Skye, MD as PCP - General (Family Medicine) Duke Salvia, MD as PCP - Electrophysiology (Cardiology)   HPI  Roger Sanchez is a 77 y.o. male seen in follow-up for spells following hospitalization 2/24.  He has a history of [name] and a series of different types of spells some which are extremely short and others more prolonged suggestive of a neurally mediated event; these occurred in the context of chronic bradycardia and in this regard his rivastigmine was discontinued because of concerns about bradycardia arrhythmias.    Persistent problems with bradycardia prompted removal of loop recorder and implantation of a [employer]onic pacemaker 7/24    Flecainide for PVC and PACs; aspirin and Plavix for prior TIA, dating back at least 5 years.  The patient denies chest pain, shortness of breath, nocturnal dyspnea, orthopnea or peripheral edema.  There have been no palpitations, lightheadedness or syncope.  With improved exercise tolerance post pacing  no more lightheadedness    DATE TEST EF    2/24 Echo  60-65%    2/24 Myoview     % No ischemia   4/24 CTA   Nonobstructive CAD     Date Cr K Hgb  2/24 0.9 3.9 11.6   7/24 0.73 3.7          Records and Results Reviewed   Past Medical History:  Diagnosis Date   Arthralgia    shoulder    Asthma    Asymmetrical hearing loss    Atypical chest pain    Brachial plexus disorders    Bradycardia    Carpal tunnel syndrome    Cerebral infarction (HCC)    Closed fracture of wrist    Exposure to Agent Orange    GERD (gastroesophageal reflux disease)    HOH (hard of hearing)    bilateral   Hyperlipidemia    Hypertrophy of prostate    benign    [name] (HCC)    Low back pain    Lumbar spine pain    compression fracture    Malignant neoplasm (HCC)    skin    Metatarsalgia    Osteopenia    Plantar fibromatosis    Stroke (HCC) 77 yrs old   no residual effects    Transient ischemic attack    Trigger finger     Past Surgical History:  Procedure Laterality Date   BACK SURGERY     BROW LIFT Bilateral 03/26/2021   Procedure: BLEPHAROPLASTY UPPER EYELID; W/EXCESS SKIN BROW PTOSIS REPAIR  BLEPHAROPTOSIS REPAIR; RESECT EX BILATERAL;  Surgeon: Imagene Riches, MD;  Location: [facility];  Service: Ophthalmology;  Laterality: Bilateral;   LOOP RECORDER REMOVAL N/A 06/06/2023   Procedure: LOOP RECORDER REMOVAL;  Surgeon: Duke Salvia, MD;  Location: [facility] INVASIVE CV LAB;  Service: Cardiovascular;  Laterality: N/A;   PACEMAKER IMPLANT N/A 06/06/2023   Procedure: PACEMAKER IMPLANT;  Surgeon: Duke Salvia, MD;  Location: [facility] INVASIVE CV LAB;  Service: Cardiovascular;  Laterality: N/A;   WRIST SURGERY Right     Current Meds  Medication Sig   Albuterol Sulfate 108 (90 Base) MCG/ACT AEPB Inhale 2 puffs into the lungs QID.   ascorbic acid (VITAMIN C) 500 MG tablet Take 500 mg by mouth daily.   aspirin EC 81 MG tablet Take 81 mg by mouth daily.   atorvastatin (LIPITOR) 20 MG tablet Take 10 mg by mouth at bedtime.   clopidogrel (  PLAVIX) 75 MG tablet Take 75 mg by mouth daily.   clotrimazole (LOTRIMIN) 1 % cream Apply topically. Apply small amount to toe nails at bedtime to treat toenail fungus   famotidine (PEPCID) 20 MG tablet Take 20 mg by mouth 2 (two) times daily.   flecainide (TAMBOCOR) 50 MG tablet Take 0.5 tablets (25 mg total) by mouth 2 (two) times daily.   glycopyrrolate (ROBINUL) 2 MG tablet Take by mouth.   GUAIFENESIN CR PO Take by mouth. prn   lidocaine (XYLOCAINE) 5 % ointment Apply topically.   Multiple Vitamins-Minerals (MULTI-VITAMIN/MINERALS PO) Take by mouth.   naproxen (NAPROSYN) 500 MG tablet Take 500 mg by mouth 2 (two) times daily with a meal.   pantoprazole (PROTONIX) 40 MG tablet Take 40 mg by mouth 2 (two) times daily.   tamsulosin (FLOMAX) 0.4 MG CAPS capsule Take 0.4 mg by mouth.    Allergies  Allergen Reactions    Barley Grass    Corn Oil Other (See Comments)   Corn-Containing Products Other (See Comments)   Malt Other (See Comments)   Milk (Cow) Other (See Comments)   Milk-Related Compounds    Peanut-Containing Drug Products Other (See Comments)   Simvastatin Other (See Comments)    Muscle aches   Wheat       Review of Systems negative except from HPI and PMH  Physical Exam BP 102/64 (BP Location: Left Arm, Patient Position: Sitting, Cuff Size: Normal)   Pulse 60   Ht 5\' 6"  (1.676 m)   Wt 172 lb 3.2 oz (78.1 kg)   SpO2 98%   BMI 27.79 kg/m  Well developed and well nourished in no acute distress HENT normal Neck supple with JVP-flat Clear Device pocket well healed; without hematoma or erythema.  There is no tethering  Regular rate and rhythm, no  gallop 2/6 early systolic murmur Abd-soft with active BS No Clubbing cyanosis  edema Skin-warm and dry A & Oriented  Grossly normal sensory and motor function  ECG atrial pacing at 60 Interval 21/09/42  Device function is normal. Programming changes outputs decreased for longevity and rate response activated  See Paceart for details         CrCl cannot be calculated (Patient's most recent lab result is older than the maximum 21 days allowed.).   Assessment and  Plan  Lightheadedness suggestive of neurally mediated events   Presyncope brief onset offset suggestive of an arrhythmic event  Sinus bradycardia with chronotropic incompetence   Prolonged episode of weakness associate with diaphoresis and weakness question mechanism   [name]   SVT-nonsustained   VT-nonsustained   Cryptogenic stroke on DAPT   No further episodes of lightheadedness.  As recommended last time, we will discontinue his aspirin (did not stop it) and continue on clopidogrel.  At his next visit we will change him to rosuvastatin, a less  lipophilic statin No interval ventricular tachycardia/SVT, continue low-dose flecainide 25 mg twice  daily.   Current medicines are reviewed at length with the patient today .  The patient does not  have concerns regarding medicines.

## 2023-09-07 NOTE — Patient Instructions (Signed)
Medication Instructions:  The current medical regimen is effective;  continue present plan and medications.  *If you need a refill on your cardiac medications before your next appointment, please call your pharmacy*   Follow-Up: At [facility], you and your health needs are our priority.  As part of our continuing mission to provide you with exceptional heart care, we have created designated Provider Care Teams.  These Care Teams include your primary Cardiologist (physician) and Advanced Practice Providers (APPs -  Physician Assistants and Nurse Practitioners) who all work together to provide you with the care you need, when you need it.  We recommend signing up for the patient portal called "MyChart".  Sign up information is provided on this After Visit Summary.  MyChart is used to connect with patients for Virtual Visits (Telemedicine).  Patients are able to view lab/test results, encounter notes, upcoming appointments, etc.  Non-urgent messages can be sent to your provider as well.   To learn more about what you can do with MyChart, go to [email or web address].    Your next appointment:   9 month(s)  Provider:   Sherryl Manges, MD    Other Instructions Device Clinic Number- [phone]

## 2023-09-26 NOTE — Progress Notes (Signed)
Remote pacemaker transmission.   

## 2023-10-10 ENCOUNTER — Encounter: Payer: No Typology Code available for payment source | Admitting: Internal Medicine

## 2023-10-12 ENCOUNTER — Ambulatory Visit: Payer: No Typology Code available for payment source | Admitting: Internal Medicine

## 2023-11-21 NOTE — Therapy (Addendum)
 OUTPATIENT PHYSICAL THERAPY BALANCE EVALUATION   Patient Name: DERYAN HERIN MRN: [id number] DOB:29-May-1946, 78 y.o., male Today's Date: 11/22/2023   PT End of Session - 11/22/23 0903     Visit Number 1    Number of Visits 21    Date for PT Re-Evaluation 01/31/24    Authorization Type Initial eval 11/22/23    PT Start Time 0903    PT Stop Time 0944    PT Time Calculation (min) 41 min    Equipment Utilized During Treatment Gait belt             Past Medical History:  Diagnosis Date   Arthralgia    shoulder    Asthma    Asymmetrical hearing loss    Atypical chest pain    Brachial plexus disorders    Bradycardia    Carpal tunnel syndrome    Cerebral infarction (HCC)    Closed fracture of wrist    Exposure to Agent Orange    GERD (gastroesophageal reflux disease)    HOH (hard of hearing)    bilateral   Hyperlipidemia    Hypertrophy of prostate    benign    [name] (HCC)    Low back pain    Lumbar spine pain    compression fracture    Malignant neoplasm (HCC)    skin    Metatarsalgia    Osteopenia    Plantar fibromatosis    Stroke (HCC) 78 yrs old   no residual effects   Transient ischemic attack    Trigger finger    Past Surgical History:  Procedure Laterality Date   BACK SURGERY     BROW LIFT Bilateral 03/26/2021   Procedure: BLEPHAROPLASTY UPPER EYELID; W/EXCESS SKIN BROW PTOSIS REPAIR  BLEPHAROPTOSIS REPAIR; RESECT EX BILATERAL;  Surgeon: Zacarias Hermann, MD;  Location: [facility];  Service: Ophthalmology;  Laterality: Bilateral;   LOOP RECORDER REMOVAL N/A 06/06/2023   Procedure: LOOP RECORDER REMOVAL;  Surgeon: Verona Goodwill, MD;  Location: [facility] INV[facility] CV [facility];  Service: Cardiovascular;  Laterality: N/A;   PACEMAKER IMPLANT N/A 06/06/2023   Procedure: PACEMAKER IMPLANT;  Surgeon: Verona Goodwill, MD;  Location: [facility] INV[facility] CV LAB;  Service: Cardiovascular;  Laterality: N/A;   WRIST SURGERY Right    Patient Active Problem List    Diagnosis Date Noted   Lightheadedness 05/08/2023   Implantable loop recorder present 03/31/2023   NSVT (nonsustained ventricular tachycardia) (HCC) 02/13/2023   SVT (supraventricular tachycardia) - non sustained 02/13/2023   Near syncope 12/13/2022   [name] (HCC) 12/13/2022   Sinus bradycardia 12/13/2022   History of CVA (cerebrovascular accident) 12/13/2022   Hyperlipidemia 12/13/2022   Dyspnea on exertion 12/13/2022    PCP: Carolanne Church, MD  REFERRING PROVIDER: Kaitlin Paich, PA-C  REFERRING DIAGNOSIS:  G31.83 (ICD-10-CM) - Neurocognitive disorder with Lewy bodies  F02.80 (ICD-10-CM) - Dementia in other diseases classified elsewhere, unspecified severity, without behavioral disturbance, psychotic disturbance, mood disturbance, and anxiety    THERAPY DIAG: Other abnormalities of gait and mobility  Difficulty in walking, not elsewhere classified  Muscle weakness (generalized)  ONSET DATE: 05/21/24  FOLLOW UP APPT WITH PROVIDER: Pt is unsure when he has appointment; he states he does have f/u    RATIONALE FOR EVALUATION AND TREATMENT: Rehabilitation   SUBJECTIVE:  Chief Complaint: Pt is a 78 year old male with Hx of Lewy Body Demantia and Parkinsonism.   Pertinent History Pt is a 78 year old male with Hx of Lewy Body Demantia and Parkinsonism. Past medical history is significant for [name], TIA, CVA in 2013, asymmetrical hearing loss, dypsnea on exertion, anxiety, HLD, asthma. Pt wants to walk up to 5 miles per day; he walks up to 2-3 mi presently (pt walks around neighborhood streets mostly). Patient reports he did use walking stick, but using it has irritated his back (known arthritis per patient). Patient has diplopia and reports blurry vision at a distance. Pt likes  to walk in woods and he feels he does okay with stairs. Kaitlin Paich, PA-C referred pt to [facility] PT for LSVT BIG intervention.    Pain: Yes; comorbid back pain/OA Numbness/Tingling: No Focal Weakness: No, not focally; he states he doesn't get out of chair as easily as he used to.  Recent changes in overall health/medication: Yes; steadily worsening [name]  Prior history of physical therapy for balance:  Yes; Pt following CVA 12-13 years ago  Falls: Has patient fallen in last 6 months? Yes, Number of falls: 1 - taking out recyling, descending stairs without rail  carrying recycling into garage Directional pattern for falls: Yes; falling forward when going down stairs and when he tripped one year ago   Imaging: Yes ;  CLINICAL DATA:  Transient ischemic attack (TIA). Neuro deficit, acute, stroke suspected.   EXAM: MRI HEAD WITHOUT CONTRAST   TECHNIQUE: Multiplanar, multiecho pulse sequences of the brain and surrounding structures were obtained without intravenous contrast.   COMPARISON:  Head CT 12/13/2022 and MRI 02/02/2022   FINDINGS: Brain: There is no evidence of an acute infarct, mass, midline shift, or extra-axial fluid collection. Small T2 hyperintensities in the cerebral white matter bilaterally are unchanged from the prior MRI and are nonspecific but compatible with mild chronic small vessel ischemic disease. Chronic medial left cerebellar infarcts are unchanged from the prior MRI, with associated hemosiderin deposition again noted. Mild cerebral atrophy is within normal limits for age.   Vascular: Unchanged abnormal appearance of the distal left vertebral artery likely reflecting chronic occlusion. Other major intracranial vascular flow voids are preserved.   Skull and upper cervical spine: Unremarkable bone marrow signal.   Sinuses/Orbits: Unremarkable orbits. Paranasal sinuses and mastoid air cells are clear.   Other: None.   IMPRESSION: 1. No  acute intracranial abnormality. 2. Mild chronic small vessel ischemic disease. 3. Chronic left cerebellar infarcts.    Prior level of function: Independent Occupational demands: Retired Presenter, broadcasting: Walking routine; pt used to enjoy fishing   Red flags (bowel/bladder changes, saddle paresthesia, personal history of cancer, h/o spinal tumors, h/o compression fx, h/o abdominal aneurysm, abdominal pain, chills/fever, night sweats, nausea, vomiting, unrelenting pain): Negative  Precautions: Fall history, fall risk; pt has pacemaker  Weight Bearing Restrictions: No  Living Environment Lives with: lives with their spouse Lives in: House/apartment; 4-5 steps with rail on R side going up.  lives in house 4-5 STE with rail remodeled bathroom with seat available (does not use) 1 level with bonus room - 12 steps with rail (where his bedroom is located) grandchildren local- enjoys playing "horse" basketball with them    Patient Goals: Maintain his level of function/independence     OBJECTIVE:   Patient Surveys  FOTO: 78 (risk-adjusted 55), predicted improvement to 74   Cognition Patient is oriented to person, place, and time.  Recent memory is intact.  Remote memory is moderately impaired.  Attention span and concentration are intact.  Expressive speech is intact, hypophonic.  Patient's fund of knowledge is within normal limits for educational level.     Gross Musculoskeletal Assessment/Observation Tremor: No resting tremor/intention tremor Bulk: Normal Tone: Hypotonic  Mask face, hypophonia.    GAIT: Distance walked: 160 ft  Assistive device utilized: None Level of assistance: SBA Comments: Good heel strike and velocity/cadence. Absent arm swing and rigid trunk.    Posture: Rounded shoulder, forward head, trunk remains slightly flexed in sitting/standing    LE MMT:  MMT (out of 5) Right 11/22/2023 Left 11/22/2023  Hip flexion 5 5  Hip extension    Hip abduction  (seated) 5 5  Hip adduction (seated) 5 5  Hip internal rotation    Hip external rotation    Knee flexion 5 5  Knee extension 5 5  Ankle dorsiflexion 5 5  Ankle plantarflexion    Ankle inversion    Ankle eversion    (* = pain; Blank rows = not tested)   Sensation Grossly intact to light touch bilateral LEs as determined by testing dermatomes L2-S2. Proprioception, and hot/cold testing deferred on this date.   Reflexes Deferred   Cranial Nerves Visual acuity and visual fields are intact  Extraocular muscles are intact  -pt exhibits convergence insufficiency  Facial sensation is intact bilaterally  Facial strength is intact bilaterally  Hearing loss is mild as tested by gross conversation Palate elevates midline, normal phonation  Shoulder shrug strength is intact  Tongue protrudes slightly L    Coordination/Cerebellar Finger to Nose: Complete next visit Heel to Shin: Complete next visit Rapid alternating movements: Complete next visit Finger Opposition: Complete next visit Pronator Drift: Complete next visit   FUNCTIONAL OUTCOME MEASURES   Results Comments  BERG Next visit/56 Fall risk, in need of intervention  FGA Next visit/30   Mini-BESTest Next visit/28   TUG 8.3 seconds   5TSTS 9.5 seconds Slight backward LOB during 4th rep, pt below cut-off time   (Blank rows = not tested)   FUNCTIONAL TASKS  Stairs: pt demonstrates reciprocal stair ascent with no handrail support, no loss of balance and sound toe clearance with each successive step Sit to stand: pt performs readily with no upper limb support, intermittently pushes posterior thighs on chair to assist and has one incident of mild posterior LOB upon attaining standing position    TODAY'S TREATMENT    Therapeutic Activity - PT education  Discussed goals of PT, excellent performance and performing superior to cut-off scores for TUG and 5-times sit to stand. We discussed plan of care and completing  higher-level balance and fall risk assessment next visit.     PATIENT EDUCATION:  Education details: see above for patient education details Person educated: Patient Education method: Explanation Education comprehension: verbalized understanding   HOME EXERCISE PROGRAM: Formal HEP to be updated next visit  *Pt has the following home exercises from [place] following 10/30/23 PT assessment:  1) step and reach forward - hands wide 10x 2) step and reach lateral- hands wide x 10 3) sit->stand hands wide- 10x 4) SLS at chair- 5x/10 sec consistently    ASSESSMENT:  CLINICAL IMPRESSION: Patient is a 78 y.o. male who was seen today for physical therapy evaluation and treatment for gait and mobility deficits secondary to [name] and parkinsonism. Pt demonstrates Parkinson's features with gait e.g. decreased arm swing and trunk rotation. Pt does not demonstrate festinating or shuffling gait pattern;  he has WNL initiation of gait and sit to stand. He has excellent 5TSTS and TUG performance. We will need to complete further fall risk screening/functional outcome measures next visit. Pt was referred by Kaitlin Paich, PA-C to [facility] PT for LSVT BIG intervention. Objective impairments include Abnormal gait, decreased balance, decreased cognition, and decreased coordination. These impairments are limiting patient from shopping and community activity. Personal factors including Age and 3+ comorbidities: (Hx of CVA, [name], parkinsonism, agent orange exposure, osteopenia, Hx of skin cancer, low back pain/spondylosis with Hx of compression Fx)  are also affecting patient's functional outcome. Patient will benefit from skilled PT to address above impairments and improve overall function.  REHAB POTENTIAL: Good  CLINICAL DECISION MAKING: Unstable/unpredictable  EVALUATION COMPLEXITY: High   GOALS: Goals reviewed with patient? Yes  SHORT TERM GOALS: Target date: 12/14/2023  Pt will be  independent with HEP in order to improve strength and balance in order to decrease fall risk and improve function at home. Baseline: 11/22/23: Formal HEP to be initiated at second follow-up visit.  Goal status: INITIAL   LONG TERM GOALS: Target date: 01/18/2024  Pt will increase FOTO to at least 77 to demonstrate significant improvement in function at home related to balance  Baseline: 11/22/23: 78 (risk-adjusted FOTO 55) Goal status: INITIAL  2.  Pt will improve BERG by at least 3 points in order to demonstrate clinically significant improvement in balance.   Baseline: 11/22/23: To be completed next visit.   Goal status: INITIAL  3.  Pt will improve Mini-BESTest by at least 4 points in order to demonstrate clinically significant improvement in balance and decreased risk for falls    Baseline: 11/22/23: To be completed next visit.   Goal status: INITIAL   4.  Pt will improve FGA by at least 5 points in order to demonstrate clinically significant improvement in balance and decreased risk for falls       Baseline: 11/22/23: To be completed next visit.   Goal status: INITIAL    PLAN: PT FREQUENCY: 2x/week  PT DURATION: 6-8 weeks  PLANNED INTERVENTIONS: Therapeutic exercises, Therapeutic activity, Neuromuscular re-education, Balance training, Gait training, Patient/Family education, Joint manipulation, Joint mobilization, Canalith repositioning, Aquatic Therapy, Dry Needling, Cognitive remediation, Electrical stimulation, Spinal manipulation, Spinal mobilization, Cryotherapy, Moist heat, Traction, Ultrasound, Ionotophoresis 4mg /ml Dexamethasone, and Manual therapy  PLAN FOR NEXT SESSION: Complete coordination/cerebellar screen. Complete BERG, Mini-BESTest, FGA. Continue with interventions to promote arm-swing and large-amplitude transfers and stepping drills. Advance HEP with successive PT visits.    Denese Finn, PT, DPT #[id number]  Aleatha Hunting 11/22/2023, 9:04 AM

## 2023-11-22 ENCOUNTER — Ambulatory Visit: Payer: PPO | Attending: Student | Admitting: Physical Therapy

## 2023-11-22 DIAGNOSIS — R2689 Other abnormalities of gait and mobility: Secondary | ICD-10-CM | POA: Insufficient documentation

## 2023-11-22 DIAGNOSIS — R262 Difficulty in walking, not elsewhere classified: Secondary | ICD-10-CM | POA: Insufficient documentation

## 2023-11-22 DIAGNOSIS — R2681 Unsteadiness on feet: Secondary | ICD-10-CM | POA: Insufficient documentation

## 2023-11-22 DIAGNOSIS — M6281 Muscle weakness (generalized): Secondary | ICD-10-CM | POA: Insufficient documentation

## 2023-11-22 NOTE — Therapy (Signed)
OUTPATIENT PHYSICAL THERAPY TREATMENT   Patient Name: Roger Sanchez MRN: [id number] DOB:[birth year], 78 y.o., male Today's Date: 11/23/2023   PT End of Session - 11/23/23 0737     Visit Number 2    Number of Visits 21    Date for PT Re-Evaluation 01/31/24    Authorization Type Initial eval 11/22/23    PT Start Time 0904    PT Stop Time 0948    PT Time Calculation (min) 44 min    Equipment Utilized During Treatment Gait belt    Behavior During Therapy Flat affect              Past Medical History:  Diagnosis Date   Arthralgia    shoulder    Asthma    Asymmetrical hearing loss    Atypical chest pain    Brachial plexus disorders    Bradycardia    Carpal tunnel syndrome    Cerebral infarction (HCC)    Closed fracture of wrist    Exposure to Agent Orange    GERD (gastroesophageal reflux disease)    HOH (hard of hearing)    bilateral   Hyperlipidemia    Hypertrophy of prostate    benign    [name] (HCC)    Low back pain    Lumbar spine pain    compression fracture    Malignant neoplasm (HCC)    skin    Metatarsalgia    Osteopenia    Plantar fibromatosis    Stroke (HCC) 78 yrs old   no residual effects   Transient ischemic attack    Trigger finger    Past Surgical History:  Procedure Laterality Date   BACK SURGERY     BROW LIFT Bilateral 03/26/2021   Procedure: BLEPHAROPLASTY UPPER EYELID; W/EXCESS SKIN BROW PTOSIS REPAIR  BLEPHAROPTOSIS REPAIR; RESECT EX BILATERAL;  Surgeon: Imagene Riches, MD;  Location: [facility];  Service: Ophthalmology;  Laterality: Bilateral;   LOOP RECORDER REMOVAL N/A 06/06/2023   Procedure: LOOP RECORDER REMOVAL;  Surgeon: Duke Salvia, MD;  Location: [facility] INVASIVE CV LAB;  Service: Cardiovascular;  Laterality: N/A;   PACEMAKER IMPLANT N/A 06/06/2023   Procedure: PACEMAKER IMPLANT;  Surgeon: Duke Salvia, MD;  Location: [facility] INVASIVE CV LAB;  Service: Cardiovascular;  Laterality: N/A;   WRIST SURGERY Right     Patient Active Problem List   Diagnosis Date Noted   Lightheadedness 05/08/2023   Implantable loop recorder present 03/31/2023   NSVT (nonsustained ventricular tachycardia) (HCC) 02/13/2023   SVT (supraventricular tachycardia) - non sustained 02/13/2023   Near syncope 12/13/2022   [name] (HCC) 12/13/2022   Sinus bradycardia 12/13/2022   History of CVA (cerebrovascular accident) 12/13/2022   Hyperlipidemia 12/13/2022   Dyspnea on exertion 12/13/2022    PCP: Louanne Skye, MD  REFERRING PROVIDER: Janice Coffin, PA-C  REFERRING DIAGNOSIS:  G31.83 (ICD-10-CM) - Neurocognitive disorder with Lewy bodies  F02.80 (ICD-10-CM) - Dementia in other diseases classified elsewhere, unspecified severity, without behavioral disturbance, psychotic disturbance, mood disturbance, and anxiety    THERAPY DIAG: Other abnormalities of gait and mobility  Difficulty in walking, not elsewhere classified  Muscle weakness (generalized)  ONSET DATE: 05/21/24  FOLLOW UP APPT WITH PROVIDER: Pt is unsure when he has appointment; he states he does have f/u    Pertinent History: Pt is a 78 year old male with Hx of Lewy Body Demantia and Parkinsonism. Past medical history is significant for [name], TIA, CVA in 2013, asymmetrical hearing loss,  dypsnea on exertion, anxiety, HLD, asthma. Pt wants to walk up to 5 miles per day; he walks up to 2-3 mi presently (pt walks around neighborhood streets mostly). Patient reports he did use walking stick, but using it has irritated his back (known arthritis per patient). Patient has diplopia and reports blurry vision at a distance. Pt likes to walk in woods and he feels he does okay with stairs. Janice Coffin, PA-C referred pt to [facility] PT for LSVT BIG intervention.    Pain: Yes; comorbid back pain/OA Numbness/Tingling: No Focal Weakness: No, not focally; he states he doesn't get out of chair as easily as he used to.  Recent changes in overall  health/medication: Yes; steadily worsening [name]  Prior history of physical therapy for balance:  Yes; Pt following CVA 12-13 years ago  Falls: Has patient fallen in last 6 months? Yes, Number of falls: 1 - taking out recyling, descending stairs without rail  carrying recycling into garage Directional pattern for falls: Yes; falling forward when going down stairs and when he tripped one year ago   Imaging: Yes ;  CLINICAL DATA:  Transient ischemic attack (TIA). Neuro deficit, acute, stroke suspected.   EXAM: MRI HEAD WITHOUT CONTRAST   TECHNIQUE: Multiplanar, multiecho pulse sequences of the brain and surrounding structures were obtained without intravenous contrast.   COMPARISON:  Head CT 12/13/2022 and MRI 02/02/2022   FINDINGS: Brain: There is no evidence of an acute infarct, mass, midline shift, or extra-axial fluid collection. Small T2 hyperintensities in the cerebral white matter bilaterally are unchanged from the prior MRI and are nonspecific but compatible with mild chronic small vessel ischemic disease. Chronic medial left cerebellar infarcts are unchanged from the prior MRI, with associated hemosiderin deposition again noted. Mild cerebral atrophy is within normal limits for age.   Vascular: Unchanged abnormal appearance of the distal left vertebral artery likely reflecting chronic occlusion. Other major intracranial vascular flow voids are preserved.   Skull and upper cervical spine: Unremarkable bone marrow signal.   Sinuses/Orbits: Unremarkable orbits. Paranasal sinuses and mastoid air cells are clear.   Other: None.   IMPRESSION: 1. No acute intracranial abnormality. 2. Mild chronic small vessel ischemic disease. 3. Chronic left cerebellar infarcts.    Prior level of function: Independent Occupational demands: Retired Presenter, broadcasting: Walking routine; pt used to enjoy fishing   Red flags (bowel/bladder changes, saddle paresthesia, personal  history of cancer, h/o spinal tumors, h/o compression fx, h/o abdominal aneurysm, abdominal pain, chills/fever, night sweats, nausea, vomiting, unrelenting pain): Negative  Precautions: Fall history, fall risk; pt has pacemaker  Weight Bearing Restrictions: No  Living Environment Lives with: lives with their spouse Lives in: House/apartment; 4-5 steps with rail on R side going up.  lives in house 4-5 STE with rail remodeled bathroom with seat available (does not use) 1 level with bonus room - 12 steps with rail (where his bedroom is located) grandchildren local- enjoys playing "horse" basketball with them    Patient Goals: Maintain his level of function/independence      OBJECTIVE (data from initial evaluation unless otherwise dated):    Patient Surveys  FOTO: 69 (risk-adjusted 55), predicted improvement to 37   Cognition Patient is oriented to person, place, and time.  Recent memory is intact.  Remote memory is moderately impaired.  Attention span and concentration are intact.  Expressive speech is intact, hypophonic.  Patient's fund of knowledge is within normal limits for educational level.     Gross Musculoskeletal Assessment/Observation Tremor:  No resting tremor/intention tremor Bulk: Normal Tone: Hypotonic  Mask face, hypophonia.    GAIT: Distance walked: 160 ft  Assistive device utilized: None Level of assistance: SBA Comments: Good heel strike and velocity/cadence. Absent arm swing and rigid trunk.    Posture: Rounded shoulder, forward head, trunk remains slightly flexed in sitting/standing    LE MMT:  MMT (out of 5) Right 11/22/2023 Left 11/22/2023  Hip flexion 5 5  Hip extension    Hip abduction (seated) 5 5  Hip adduction (seated) 5 5  Hip internal rotation    Hip external rotation    Knee flexion 5 5  Knee extension 5 5  Ankle dorsiflexion 5 5  Ankle plantarflexion    Ankle inversion    Ankle eversion    (* = pain; Blank rows = not  tested)   Sensation Grossly intact to light touch bilateral LEs as determined by testing dermatomes L2-S2. Proprioception, and hot/cold testing deferred on this date.   Reflexes Deferred   Cranial Nerves Visual acuity and visual fields are intact  Extraocular muscles are intact  -pt exhibits convergence insufficiency  Facial sensation is intact bilaterally  Facial strength is intact bilaterally  Hearing loss is mild as tested by gross conversation Palate elevates midline, normal phonation  Shoulder shrug strength is intact  Tongue protrudes slightly L    Coordination/Cerebellar (updated 11/23/23) Finger to Nose: WNL Heel to Shin: WNL Rapid alternating movements: UE impaired, LE WNL Finger Opposition: WNL Pronator Drift: Positive on L upper limb   FUNCTIONAL OUTCOME MEASURES (baseline measure updated 11/23/23)   Results Comments  BERG 11/23/23: 53/56 Above fall risk cut-off score  FGA 11/23/23: 27/30 Above fall risk cut-off score  Mini-BESTest 11/23/23: 24/28 Above fall risk cut-off score  TUG 11/22/23: 8.3 seconds Well under cut-off score for various populations  5TSTS 11/22/23: 9.5 seconds Slight backward LOB during 4th rep, pt below cut-off time   (Blank rows = not tested)   FUNCTIONAL TASKS  Stairs: pt demonstrates reciprocal stair ascent with no handrail support, no loss of balance and sound toe clearance with each successive step Sit to stand: pt performs readily with no upper limb support, intermittently pushes posterior thighs on chair to assist and has one incident of mild posterior LOB upon attaining standing position     TODAY'S TREATMENT    SUBJECTIVE STATEMENT:   Patient reports tolerating exercises given from [place] relatively well. He reports no significant changes or new complaints since initial exam yesterday.     Neuromuscular Re-education - for improved sensory integration, static and dynamic postural control, equilibrium and non-equilibrium  coordination as needed for negotiating home and community environment and stepping over obstacles  *Completion of BERG, FGA, Mini-BESTest  -discussed results of outcome measures  *Completed coordination screen     Therapeutic Exercise - improved strength as needed to improve performance of CKC activities/functional movements and as needed for power production to prevent fall during episode of large postural perturbation   NuStep; Level 3, x 5 minutes - for improved soft tissue mobility and increased tissue temperature to improve muscle performance   -subjective gathered during portion of this time, 3 minutes not billed  PATIENT EDUCATION: Discussed goals of PT and role of large-amplitude movements to improve movement impairments associated with LBD/Parkinsonism. We encouraged continued work on [employer] as given by [employer] and expectation for updated HEP with future visits.     PATIENT EDUCATION:  Education details: see above for patient education details Person educated: Patient Education  method: Explanation Education comprehension: verbalized understanding   HOME EXERCISE PROGRAM: *Formal HEP to be updated with subsequent PT visits  *Pt has the following home exercises from [place] following 10/30/23 PT assessment at [facility]:  1) step and reach forward - hands wide 10x 2) step and reach lateral- hands wide x 10 3) sit->stand hands wide- 10x 4) SLS at chair- 5x/10 sec consistently    ASSESSMENT:  CLINICAL IMPRESSION: Patient has superior level of performance on all outcome measures today in spite of lewy body dementia and parkinsonism. He is above cut-off scores for fall risk. Patient demonstrates safe stair negotiation with use of handrail. He is most limited in postural control when standing on LLE (affected by previous CVA) and with TUG with cognitive task. Pt will benefit from dual task training and exercises to challenge stability on L side. Pt may be able to transition to [employer] or community fitness classes relatively soon given his current level of performance and outcome measure scores. Patient will benefit from skilled PT to address above impairments and improve overall function.  REHAB POTENTIAL: Good  CLINICAL DECISION MAKING: Unstable/unpredictable  EVALUATION COMPLEXITY: High   GOALS: Goals reviewed with patient? Yes  SHORT TERM GOALS: Target date: 12/14/2023  Pt will be independent with HEP in order to improve strength and balance in order to decrease fall risk and improve function at home. Baseline: 11/22/23: Formal HEP to be initiated at second follow-up visit.  Goal status: INITIAL   LONG TERM GOALS: Target date: 01/18/2024  Pt will increase FOTO to at least 77 to demonstrate significant improvement in function at home related to balance  Baseline: 11/22/23: 78 (risk-adjusted FOTO 55) Goal status: INITIAL  2.  Pt will improve BERG by at least 3 points in order to demonstrate clinically significant improvement in balance.   Baseline: 11/22/23: To be completed next visit.    11/23/23: 53/56 Goal status: INITIAL  3.  Pt will improve Mini-BESTest by at least 4 points in order to demonstrate clinically significant improvement in balance and decreased risk for falls    Baseline: 11/22/23: To be completed next visit.    11/23/23: 24/28 Goal status: INITIAL   4.  Pt will improve FGA by at least 5 points in order to demonstrate clinically significant improvement in balance and decreased risk for falls       Baseline: 11/22/23: To be completed next visit.    11/23/23: 27/30 Goal status: INITIAL    PLAN: PT FREQUENCY: 2x/week  PT DURATION: 6-8 weeks  PLANNED INTERVENTIONS: Therapeutic exercises, Therapeutic activity, Neuromuscular re-education, Balance training, Gait training, Patient/Family education, Joint manipulation, Joint mobilization, Canalith repositioning, Aquatic Therapy, Dry Needling, Cognitive remediation, Electrical stimulation, Spinal  manipulation, Spinal mobilization, Cryotherapy, Moist heat, Traction, Ultrasound, Ionotophoresis 4mg /ml Dexamethasone, and Manual therapy  PLAN FOR NEXT SESSION:  Continue with interventions to promote arm-swing and large-amplitude transfers and stepping drills. Incorporate dual tasking with both dual motor tasks and cognitive tasks with gait/stepping. Progress postural control with most impairment in LLE. Advance HEP with successive PT visits.    Consuela Mimes, PT, DPT #[id number]  Gertie Exon 11/23/2023, 10:16 AM

## 2023-11-23 ENCOUNTER — Encounter: Payer: Self-pay | Admitting: Physical Therapy

## 2023-11-23 ENCOUNTER — Ambulatory Visit: Payer: PPO | Admitting: Physical Therapy

## 2023-11-23 ENCOUNTER — Encounter: Payer: Self-pay | Admitting: Internal Medicine

## 2023-11-23 DIAGNOSIS — M6281 Muscle weakness (generalized): Secondary | ICD-10-CM

## 2023-11-23 DIAGNOSIS — R262 Difficulty in walking, not elsewhere classified: Secondary | ICD-10-CM

## 2023-11-23 DIAGNOSIS — R2689 Other abnormalities of gait and mobility: Secondary | ICD-10-CM | POA: Diagnosis not present

## 2023-11-27 ENCOUNTER — Encounter: Payer: No Typology Code available for payment source | Admitting: Physical Therapy

## 2023-11-28 ENCOUNTER — Encounter: Payer: No Typology Code available for payment source | Admitting: Physical Therapy

## 2023-11-30 ENCOUNTER — Ambulatory Visit: Payer: PPO

## 2023-11-30 DIAGNOSIS — M6281 Muscle weakness (generalized): Secondary | ICD-10-CM

## 2023-11-30 DIAGNOSIS — R2689 Other abnormalities of gait and mobility: Secondary | ICD-10-CM | POA: Diagnosis not present

## 2023-11-30 DIAGNOSIS — R2681 Unsteadiness on feet: Secondary | ICD-10-CM

## 2023-11-30 NOTE — Therapy (Signed)
OUTPATIENT PHYSICAL THERAPY TREATMENT   Patient Name: Roger Sanchez MRN: [id number] DOB:Aug 14, 1946, 78 y.o., male Today's Date: 11/30/2023   PT End of Session - 11/30/23 1320     Visit Number 3    Number of Visits 21    Date for PT Re-Evaluation 01/31/24    Authorization Type Initial eval 11/22/23    PT Start Time 0815    PT Stop Time 0900    PT Time Calculation (min) 45 min    Equipment Utilized During Treatment Gait belt    Activity Tolerance Patient tolerated treatment well    Behavior During Therapy Flat affect               Past Medical History:  Diagnosis Date   Arthralgia    shoulder    Asthma    Asymmetrical hearing loss    Atypical chest pain    Brachial plexus disorders    Bradycardia    Carpal tunnel syndrome    Cerebral infarction (HCC)    Closed fracture of wrist    Exposure to Agent Orange    GERD (gastroesophageal reflux disease)    HOH (hard of hearing)    bilateral   Hyperlipidemia    Hypertrophy of prostate    benign    [name] (HCC)    Low back pain    Lumbar spine pain    compression fracture    Malignant neoplasm (HCC)    skin    Metatarsalgia    Osteopenia    Plantar fibromatosis    Stroke (HCC) 78 yrs old   no residual effects   Transient ischemic attack    Trigger finger    Past Surgical History:  Procedure Laterality Date   BACK SURGERY     BROW LIFT Bilateral 03/26/2021   Procedure: BLEPHAROPLASTY UPPER EYELID; W/EXCESS SKIN BROW PTOSIS REPAIR  BLEPHAROPTOSIS REPAIR; RESECT EX BILATERAL;  Surgeon: Imagene Riches, MD;  Location: [facility];  Service: Ophthalmology;  Laterality: Bilateral;   LOOP RECORDER REMOVAL N/A 06/06/2023   Procedure: LOOP RECORDER REMOVAL;  Surgeon: Duke Salvia, MD;  Location: [facility] INVASIVE CV LAB;  Service: Cardiovascular;  Laterality: N/A;   PACEMAKER IMPLANT N/A 06/06/2023   Procedure: PACEMAKER IMPLANT;  Surgeon: Duke Salvia, MD;  Location: [facility] INVASIVE CV LAB;   Service: Cardiovascular;  Laterality: N/A;   WRIST SURGERY Right    Patient Active Problem List   Diagnosis Date Noted   Lightheadedness 05/08/2023   Implantable loop recorder present 03/31/2023   NSVT (nonsustained ventricular tachycardia) (HCC) 02/13/2023   SVT (supraventricular tachycardia) - non sustained 02/13/2023   Near syncope 12/13/2022   [name] (HCC) 12/13/2022   Sinus bradycardia 12/13/2022   History of CVA (cerebrovascular accident) 12/13/2022   Hyperlipidemia 12/13/2022   Dyspnea on exertion 12/13/2022    PCP: Louanne Skye, MD  REFERRING PROVIDER: Janice Coffin, PA-C  REFERRING DIAGNOSIS:  G31.83 (ICD-10-CM) - Neurocognitive disorder with Lewy bodies  F02.80 (ICD-10-CM) - Dementia in other diseases classified elsewhere, unspecified severity, without behavioral disturbance, psychotic disturbance, mood disturbance, and anxiety    THERAPY DIAG: Muscle weakness (generalized)  Unsteadiness on feet  Other abnormalities of gait and mobility  ONSET DATE: 05/21/24  FOLLOW UP APPT WITH PROVIDER: Pt is unsure when he has appointment; he states he does have f/u    Pertinent History: Pt is a 78 year old male with Hx of Lewy Body Demantia and Parkinsonism. Past medical history is significant for [name],  TIA, CVA in 2013, asymmetrical hearing loss, dypsnea on exertion, anxiety, HLD, asthma. Pt wants to walk up to 5 miles per day; he walks up to 2-3 mi presently (pt walks around neighborhood streets mostly). Patient reports he did use walking stick, but using it has irritated his back (known arthritis per patient). Patient has diplopia and reports blurry vision at a distance. Pt likes to walk in woods and he feels he does okay with stairs. Janice Coffin, PA-C referred pt to [facility] for LSVT BIG intervention.    Pain: Yes; comorbid back pain/OA Numbness/Tingling: No Focal Weakness: No, not focally; he states he doesn't get out of chair as easily as he  used to.  Recent changes in overall health/medication: Yes; steadily worsening [name]  Prior history of physical therapy for balance:  Yes; Pt following CVA 12-13 years ago  Falls: Has patient fallen in last 6 months? Yes, Number of falls: 1 - taking out recyling, descending stairs without rail  carrying recycling into garage Directional pattern for falls: Yes; falling forward when going down stairs and when he tripped one year ago   Imaging: Yes ;  CLINICAL DATA:  Transient ischemic attack (TIA). Neuro deficit, acute, stroke suspected.   EXAM: MRI HEAD WITHOUT CONTRAST   TECHNIQUE: Multiplanar, multiecho pulse sequences of the brain and surrounding structures were obtained without intravenous contrast.   COMPARISON:  Head CT 12/13/2022 and MRI 02/02/2022   FINDINGS: Brain: There is no evidence of an acute infarct, mass, midline shift, or extra-axial fluid collection. Small T2 hyperintensities in the cerebral white matter bilaterally are unchanged from the prior MRI and are nonspecific but compatible with mild chronic small vessel ischemic disease. Chronic medial left cerebellar infarcts are unchanged from the prior MRI, with associated hemosiderin deposition again noted. Mild cerebral atrophy is within normal limits for age.   Vascular: Unchanged abnormal appearance of the distal left vertebral artery likely reflecting chronic occlusion. Other major intracranial vascular flow voids are preserved.   Skull and upper cervical spine: Unremarkable bone marrow signal.   Sinuses/Orbits: Unremarkable orbits. Paranasal sinuses and mastoid air cells are clear.   Other: None.   IMPRESSION: 1. No acute intracranial abnormality. 2. Mild chronic small vessel ischemic disease. 3. Chronic left cerebellar infarcts.    Prior level of function: Independent Occupational demands: Retired Presenter, broadcasting: Walking routine; pt used to enjoy fishing   Red flags (bowel/bladder  changes, saddle paresthesia, personal history of cancer, h/o spinal tumors, h/o compression fx, h/o abdominal aneurysm, abdominal pain, chills/fever, night sweats, nausea, vomiting, unrelenting pain): Negative  Precautions: Fall history, fall risk; pt has pacemaker  Weight Bearing Restrictions: No  Living Environment Lives with: lives with their spouse Lives in: House/apartment; 4-5 steps with rail on R side going up.  lives in house 4-5 STE with rail remodeled bathroom with seat available (does not use) 1 level with bonus room - 12 steps with rail (where his bedroom is located) grandchildren local- enjoys playing "horse" basketball with them    Patient Goals: Maintain his level of function/independence      OBJECTIVE (data from initial evaluation unless otherwise dated):    Patient Surveys  FOTO: 78 (risk-adjusted 55), predicted improvement to 62   Cognition Patient is oriented to person, place, and time.  Recent memory is intact.  Remote memory is moderately impaired.  Attention span and concentration are intact.  Expressive speech is intact, hypophonic.  Patient's fund of knowledge is within normal limits for educational level.  Gross Musculoskeletal Assessment/Observation Tremor: No resting tremor/intention tremor Bulk: Normal Tone: Hypotonic  Mask face, hypophonia.    GAIT: Distance walked: 160 ft  Assistive device utilized: None Level of assistance: SBA Comments: Good heel strike and velocity/cadence. Absent arm swing and rigid trunk.    Posture: Rounded shoulder, forward head, trunk remains slightly flexed in sitting/standing    LE MMT:  MMT (out of 5) Right 11/22/2023 Left 11/22/2023  Hip flexion 5 5  Hip extension    Hip abduction (seated) 5 5  Hip adduction (seated) 5 5  Hip internal rotation    Hip external rotation    Knee flexion 5 5  Knee extension 5 5  Ankle dorsiflexion 5 5  Ankle plantarflexion    Ankle inversion    Ankle  eversion    (* = pain; Blank rows = not tested)   Sensation Grossly intact to light touch bilateral LEs as determined by testing dermatomes L2-S2. Proprioception, and hot/cold testing deferred on this date.   Reflexes Deferred   Cranial Nerves Visual acuity and visual fields are intact  Extraocular muscles are intact  -pt exhibits convergence insufficiency  Facial sensation is intact bilaterally  Facial strength is intact bilaterally  Hearing loss is mild as tested by gross conversation Palate elevates midline, normal phonation  Shoulder shrug strength is intact  Tongue protrudes slightly L    Coordination/Cerebellar (updated 11/23/23) Finger to Nose: WNL Heel to Shin: WNL Rapid alternating movements: UE impaired, LE WNL Finger Opposition: WNL Pronator Drift: Positive on L upper limb   FUNCTIONAL OUTCOME MEASURES (baseline measure updated 11/23/23)   Results Comments  BERG 11/23/23: 53/56 Above fall risk cut-off score  FGA 11/23/23: 27/30 Above fall risk cut-off score  Mini-BESTest 11/23/23: 24/28 Above fall risk cut-off score  TUG 11/22/23: 8.3 seconds Well under cut-off score for various populations  5TSTS 11/22/23: 9.5 seconds Slight backward LOB during 4th rep, pt below cut-off time   (Blank rows = not tested)   FUNCTIONAL TASKS  Stairs: pt demonstrates reciprocal stair ascent with no handrail support, no loss of balance and sound toe clearance with each successive step Sit to stand: pt performs readily with no upper limb support, intermittently pushes posterior thighs on chair to assist and has one incident of mild posterior LOB upon attaining standing position     TODAY'S TREATMENT    SUBJECTIVE STATEMENT:   Patient reports tolerating exercises given from [place] relatively well. He reports no significant changes or new complaints since initial exam yesterday.     Neuromuscular Re-education -  Pt challenged with BLE strengthening and balance exs with #2  ankle wts   Gait training with Cue for Big movement to promote balance.     Therapeutic Exercise - improved strength as needed to improve performance of CKC activities/functional movements and as needed for power production to prevent fall during episode of large postural perturbation   NuStep; Level 3-4, x 7 minutes - Forward and side step up 2 mins   PATIENT EDUCATION: Discussed goals of PT and role of large-amplitude movements to improve movement impairments associated with LBD/Parkinsonism. We encouraged continued work on [employer] as given by [employer] and expectation for updated HEP with future visits.     PATIENT EDUCATION:  Education details: see above for patient education details Person educated: Patient Education method: Explanation Education comprehension: verbalized understanding   HOME EXERCISE PROGRAM: *Formal HEP to be updated with subsequent PT visits  *Pt has the following home exercises from [place]  following 10/30/23 PT assessment at [facility] center:  1) step and reach forward - hands wide 10x 2) step and reach lateral- hands wide x 10 3) sit->stand hands wide- 10x 4) SLS at chair- 5x/10 sec consistently    ASSESSMENT:  CLINICAL IMPRESSION: Patient has superior level of performance on all outcome measures today in spite of lewy body dementia and parkinsonism. He is above cut-off scores for fall risk. Patient demonstrates safe stair negotiation with use of handrail. He is most limited in postural control when standing on LLE (affected by previous CVA) and with TUG with cognitive task. Pt will benefit from dual task training and exercises to challenge stability on L side. Pt may be able to transition to [employer] or community fitness classes relatively soon given his current level of performance and outcome measure scores. Pt introduced ot Big movement activities to promote balance. Patient will benefit from skilled PT to address above impairments and improve overall  function.  REHAB POTENTIAL: Good  CLINICAL DECISION MAKING: Unstable/unpredictable  EVALUATION COMPLEXITY: High   GOALS: Goals reviewed with patient? Yes  SHORT TERM GOALS: Target date: 12/14/2023  Pt will be independent with HEP in order to improve strength and balance in order to decrease fall risk and improve function at home. Baseline: 11/22/23: Formal HEP to be initiated at second follow-up visit.  Goal status: INITIAL   LONG TERM GOALS: Target date: 01/18/2024  Pt will increase FOTO to at least 77 to demonstrate significant improvement in function at home related to balance  Baseline: 11/22/23: 78 (risk-adjusted FOTO 55) Goal status: INITIAL  2.  Pt will improve BERG by at least 3 points in order to demonstrate clinically significant improvement in balance.   Baseline: 11/22/23: To be completed next visit.    11/23/23: 53/56 Goal status: INITIAL  3.  Pt will improve Mini-BESTest by at least 4 points in order to demonstrate clinically significant improvement in balance and decreased risk for falls    Baseline: 11/22/23: To be completed next visit.    11/23/23: 24/28 Goal status: INITIAL   4.  Pt will improve FGA by at least 5 points in order to demonstrate clinically significant improvement in balance and decreased risk for falls       Baseline: 11/22/23: To be completed next visit.    11/23/23: 27/30 Goal status: INITIAL    PLAN: PT FREQUENCY: 2x/week  PT DURATION: 6-8 weeks  PLANNED INTERVENTIONS: Therapeutic exercises, Therapeutic activity, Neuromuscular re-education, Balance training, Gait training, Patient/Family education, Joint manipulation, Joint mobilization, Canalith repositioning, Aquatic Therapy, Dry Needling, Cognitive remediation, Electrical stimulation, Spinal manipulation, Spinal mobilization, Cryotherapy, Moist heat, Traction, Ultrasound, Ionotophoresis 4mg /ml Dexamethasone, and Manual therapy  PLAN FOR NEXT SESSION:  Continue with interventions to promote  arm-swing and large-amplitude transfers and stepping drills. Incorporate dual tasking with both dual motor tasks and cognitive tasks with gait/stepping. Progress postural control with most impairment in LLE. Advance HEP with successive PT visits.    Janet Berlin PT DPT 1:22 PM,11/30/23

## 2023-12-01 NOTE — Therapy (Signed)
OUTPATIENT PHYSICAL THERAPY TREATMENT   Patient Name: Roger Sanchez MRN: [id number] DOB:1946/11/02, 78 y.o., male Today's Date: 12/04/2023   PT End of Session - 12/04/23 0900     Visit Number 4    Number of Visits 21    Date for PT Re-Evaluation 01/31/24    Authorization Type Initial eval 11/22/23    PT Start Time 0900    PT Stop Time 0942    PT Time Calculation (min) 42 min    Equipment Utilized During Treatment Gait belt    Activity Tolerance Patient tolerated treatment well    Behavior During Therapy Flat affect                Past Medical History:  Diagnosis Date   Arthralgia    shoulder    Asthma    Asymmetrical hearing loss    Atypical chest pain    Brachial plexus disorders    Bradycardia    Carpal tunnel syndrome    Cerebral infarction (HCC)    Closed fracture of wrist    Exposure to Agent Orange    GERD (gastroesophageal reflux disease)    HOH (hard of hearing)    bilateral   Hyperlipidemia    Hypertrophy of prostate    benign    [name] (HCC)    Low back pain    Lumbar spine pain    compression fracture    Malignant neoplasm (HCC)    skin    Metatarsalgia    Osteopenia    Plantar fibromatosis    Stroke (HCC) 78 yrs old   no residual effects   Transient ischemic attack    Trigger finger    Past Surgical History:  Procedure Laterality Date   BACK SURGERY     BROW LIFT Bilateral 03/26/2021   Procedure: BLEPHAROPLASTY UPPER EYELID; W/EXCESS SKIN BROW PTOSIS REPAIR  BLEPHAROPTOSIS REPAIR; RESECT EX BILATERAL;  Surgeon: Imagene Riches, MD;  Location: [facility];  Service: Ophthalmology;  Laterality: Bilateral;   LOOP RECORDER REMOVAL N/A 06/06/2023   Procedure: LOOP RECORDER REMOVAL;  Surgeon: Duke Salvia, MD;  Location: [facility] INVASIVE CV LAB;  Service: Cardiovascular;  Laterality: N/A;   PACEMAKER IMPLANT N/A 06/06/2023   Procedure: PACEMAKER IMPLANT;  Surgeon: Duke Salvia, MD;  Location: [facility] INVASIVE CV LAB;   Service: Cardiovascular;  Laterality: N/A;   WRIST SURGERY Right    Patient Active Problem List   Diagnosis Date Noted   Lightheadedness 05/08/2023   Implantable loop recorder present 03/31/2023   NSVT (nonsustained ventricular tachycardia) (HCC) 02/13/2023   SVT (supraventricular tachycardia) - non sustained 02/13/2023   Near syncope 12/13/2022   [name] (HCC) 12/13/2022   Sinus bradycardia 12/13/2022   History of CVA (cerebrovascular accident) 12/13/2022   Hyperlipidemia 12/13/2022   Dyspnea on exertion 12/13/2022    PCP: Louanne Skye, MD  REFERRING PROVIDER: Janice Coffin, PA-C  REFERRING DIAGNOSIS:  G31.83 (ICD-10-CM) - Neurocognitive disorder with Lewy bodies  F02.80 (ICD-10-CM) - Dementia in other diseases classified elsewhere, unspecified severity, without behavioral disturbance, psychotic disturbance, mood disturbance, and anxiety    THERAPY DIAG: Muscle weakness (generalized)  Unsteadiness on feet  Other abnormalities of gait and mobility  Difficulty in walking, not elsewhere classified  ONSET DATE: 05/21/24  FOLLOW UP APPT WITH PROVIDER: Pt is unsure when he has appointment; he states he does have f/u    Pertinent History: Pt is a 78 year old male with Hx of Lewy Body Demantia and Parkinsonism. Past  medical history is significant for [name], TIA, CVA in 2013, asymmetrical hearing loss, dypsnea on exertion, anxiety, HLD, asthma. Pt wants to walk up to 5 miles per day; he walks up to 2-3 mi presently (pt walks around neighborhood streets mostly). Patient reports he did use walking stick, but using it has irritated his back (known arthritis per patient). Patient has diplopia and reports blurry vision at a distance. Pt likes to walk in woods and he feels he does okay with stairs. Janice Coffin, PA-C referred pt to [facility] for LSVT BIG intervention.    Pain: Yes; comorbid back pain/OA Numbness/Tingling: No Focal Weakness: No, not focally; he  states he doesn't get out of chair as easily as he used to.  Recent changes in overall health/medication: Yes; steadily worsening [name]  Prior history of physical therapy for balance:  Yes; Pt following CVA 12-13 years ago  Falls: Has patient fallen in last 6 months? Yes, Number of falls: 1 - taking out recyling, descending stairs without rail  carrying recycling into garage Directional pattern for falls: Yes; falling forward when going down stairs and when he tripped one year ago   Imaging: Yes ;  CLINICAL DATA:  Transient ischemic attack (TIA). Neuro deficit, acute, stroke suspected.   EXAM: MRI HEAD WITHOUT CONTRAST   TECHNIQUE: Multiplanar, multiecho pulse sequences of the brain and surrounding structures were obtained without intravenous contrast.   COMPARISON:  Head CT 12/13/2022 and MRI 02/02/2022   FINDINGS: Brain: There is no evidence of an acute infarct, mass, midline shift, or extra-axial fluid collection. Small T2 hyperintensities in the cerebral white matter bilaterally are unchanged from the prior MRI and are nonspecific but compatible with mild chronic small vessel ischemic disease. Chronic medial left cerebellar infarcts are unchanged from the prior MRI, with associated hemosiderin deposition again noted. Mild cerebral atrophy is within normal limits for age.   Vascular: Unchanged abnormal appearance of the distal left vertebral artery likely reflecting chronic occlusion. Other major intracranial vascular flow voids are preserved.   Skull and upper cervical spine: Unremarkable bone marrow signal.   Sinuses/Orbits: Unremarkable orbits. Paranasal sinuses and mastoid air cells are clear.   Other: None.   IMPRESSION: 1. No acute intracranial abnormality. 2. Mild chronic small vessel ischemic disease. 3. Chronic left cerebellar infarcts.    Prior level of function: Independent Occupational demands: Retired Presenter, broadcasting: Walking routine; pt used to  enjoy fishing   Red flags (bowel/bladder changes, saddle paresthesia, personal history of cancer, h/o spinal tumors, h/o compression fx, h/o abdominal aneurysm, abdominal pain, chills/fever, night sweats, nausea, vomiting, unrelenting pain): Negative  Precautions: Fall history, fall risk; pt has pacemaker  Weight Bearing Restrictions: No  Living Environment Lives with: lives with their spouse Lives in: House/apartment; 4-5 steps with rail on R side going up.  lives in house 4-5 STE with rail remodeled bathroom with seat available (does not use) 1 level with bonus room - 12 steps with rail (where his bedroom is located) grandchildren local- enjoys playing "horse" basketball with them    Patient Goals: Maintain his level of function/independence      OBJECTIVE (data from initial evaluation unless otherwise dated):    Patient Surveys  FOTO: 65 (risk-adjusted 55), predicted improvement to 45   Cognition Patient is oriented to person, place, and time.  Recent memory is intact.  Remote memory is moderately impaired.  Attention span and concentration are intact.  Expressive speech is intact, hypophonic.  Patient's fund of knowledge  is within normal limits for educational level.     Gross Musculoskeletal Assessment/Observation Tremor: No resting tremor/intention tremor Bulk: Normal Tone: Hypotonic  Mask face, hypophonia.    GAIT: Distance walked: 160 ft  Assistive device utilized: None Level of assistance: SBA Comments: Good heel strike and velocity/cadence. Absent arm swing and rigid trunk.    Posture: Rounded shoulder, forward head, trunk remains slightly flexed in sitting/standing    LE MMT:  MMT (out of 5) Right 11/22/2023 Left 11/22/2023  Hip flexion 5 5  Hip extension    Hip abduction (seated) 5 5  Hip adduction (seated) 5 5  Hip internal rotation    Hip external rotation    Knee flexion 5 5  Knee extension 5 5  Ankle dorsiflexion 5 5  Ankle  plantarflexion    Ankle inversion    Ankle eversion    (* = pain; Blank rows = not tested)   Sensation Grossly intact to light touch bilateral LEs as determined by testing dermatomes L2-S2. Proprioception, and hot/cold testing deferred on this date.   Reflexes Deferred   Cranial Nerves Visual acuity and visual fields are intact  Extraocular muscles are intact  -pt exhibits convergence insufficiency  Facial sensation is intact bilaterally  Facial strength is intact bilaterally  Hearing loss is mild as tested by gross conversation Palate elevates midline, normal phonation  Shoulder shrug strength is intact  Tongue protrudes slightly L    Coordination/Cerebellar (updated 11/23/23) Finger to Nose: WNL Heel to Shin: WNL Rapid alternating movements: UE impaired, LE WNL Finger Opposition: WNL Pronator Drift: Positive on L upper limb   FUNCTIONAL OUTCOME MEASURES (baseline measure updated 11/23/23)   Results Comments  BERG 11/23/23: 53/56 Above fall risk cut-off score  FGA 11/23/23: 27/30 Above fall risk cut-off score  Mini-BESTest 11/23/23: 24/28 Above fall risk cut-off score  TUG 11/22/23: 8.3 seconds Well under cut-off score for various populations  5TSTS 11/22/23: 9.5 seconds Slight backward LOB during 4th rep, pt below cut-off time   (Blank rows = not tested)   FUNCTIONAL TASKS  Stairs: pt demonstrates reciprocal stair ascent with no handrail support, no loss of balance and sound toe clearance with each successive step Sit to stand: pt performs readily with no upper limb support, intermittently pushes posterior thighs on chair to assist and has one incident of mild posterior LOB upon attaining standing position     TODAY'S TREATMENT    SUBJECTIVE STATEMENT:   Patient reports he is doing well with his exercises and is doing them once a day.    Neuromuscular Re-education  Gait training with 4# AW with cues for over emphasizing arm swing  High marching with 4# AW in //  bars x 3 laps   Sidestepping with 4# AW in // bars x 3 laps   Activity Description: Standing on level surface with 3 pods placed on mirror (center, L, and R - arms length out) and 3 pods placed on ground (center, L, and R) Activity Setting:  The Blaze Pod Random setting was chosen to enhance cognitive processing and agility, providing an unpredictable environment to simulate real-world scenarios, and fostering quick reactions and adaptability.  Number of Pods:  6 Cycles/Sets:  3 Duration (Time or Hit Count):  1 minute each   Activity Description: Standing on airex pad with 3 pods placed on mirror (center, L, and R - arms length out) and 3 pods placed on ground (center, L, and R) Activity Setting:  The [facility] Random setting  was chosen to enhance cognitive processing and agility, providing an unpredictable environment to simulate real-world scenarios, and fostering quick reactions and adaptability.  Number of Pods:  6 Cycles/Sets:  3 Duration (Time or Hit Count):  1 minute each     Therapeutic Exercise - improved strength as needed to improve performance of CKC activities/functional movements and as needed for power production to prevent fall during episode of large postural perturbation   NuStep; Level 4, x 7 minutes  Forward step taps with 2# AW x 15 each, x 15 with 4# AW' Sit to stand with 8# medicine ball chest press 2 x 10    PATIENT EDUCATION: Discussed goals of PT and role of large-amplitude movements to improve movement impairments associated with LBD/Parkinsonism. We encouraged continued work on [employer] as given by [employer] and expectation for updated HEP with future visits.     PATIENT EDUCATION:  Education details: see above for patient education details Person educated: Patient Education method: Explanation Education comprehension: verbalized understanding   HOME EXERCISE PROGRAM: *Formal HEP to be updated with subsequent PT visits  *Pt has the following home exercises from [place]  following 10/30/23 PT assessment at [facility] center:  1) step and reach forward - hands wide 10x 2) step and reach lateral- hands wide x 10 3) sit->stand hands wide- 10x 4) SLS at chair- 5x/10 sec consistently    ASSESSMENT:  CLINICAL IMPRESSION:   Patient arrives to treatment session motivated to participate. Patient's biggest goal is to maintain the strength and balance he has as he understands Lewy Body is progressive and no cure. Session focused on BLE strengthening and balance activities. The patient demonstrated significant progress while utilizing [employer], showcasing improved coordination, balance, and cognitive function. The incorporation of dual-tasking technology with color recognition and association with specific movements in Blaze Pods was strategically chosen to provide a dynamic training environment, enabling the patient to engage in simultaneous physical and cognitive tasks. This unique approach enhances not only their physical abilities but also fosters increased neural connectivity and mental awareness, contributing to a well-rounded and effective rehabilitation and training experience. Patient will continue to benefit from skilled therapy to address remaining deficits in order to improve quality of life and maintain current LOF.    REHAB POTENTIAL: Good  CLINICAL DECISION MAKING: Unstable/unpredictable  EVALUATION COMPLEXITY: High   GOALS: Goals reviewed with patient? Yes  SHORT TERM GOALS: Target date: 12/14/2023  Pt will be independent with HEP in order to improve strength and balance in order to decrease fall risk and improve function at home. Baseline: 11/22/23: Formal HEP to be initiated at second follow-up visit.  Goal status: INITIAL   LONG TERM GOALS: Target date: 01/18/2024  Pt will increase FOTO to at least 77 to demonstrate significant improvement in function at home related to balance  Baseline: 11/22/23: 78 (risk-adjusted FOTO 55) Goal status: INITIAL  2.   Pt will improve BERG by at least 3 points in order to demonstrate clinically significant improvement in balance.   Baseline: 11/22/23: To be completed next visit.    11/23/23: 53/56 Goal status: INITIAL  3.  Pt will improve Mini-BESTest by at least 4 points in order to demonstrate clinically significant improvement in balance and decreased risk for falls    Baseline: 11/22/23: To be completed next visit.    11/23/23: 24/28 Goal status: INITIAL   4.  Pt will improve FGA by at least 5 points in order to demonstrate clinically significant improvement in balance and decreased risk  for falls       Baseline: 11/22/23: To be completed next visit.    11/23/23: 27/30 Goal status: INITIAL    PLAN: PT FREQUENCY: 2x/week  PT DURATION: 6-8 weeks  PLANNED INTERVENTIONS: Therapeutic exercises, Therapeutic activity, Neuromuscular re-education, Balance training, Gait training, Patient/Family education, Joint manipulation, Joint mobilization, Canalith repositioning, Aquatic Therapy, Dry Needling, Cognitive remediation, Electrical stimulation, Spinal manipulation, Spinal mobilization, Cryotherapy, Moist heat, Traction, Ultrasound, Ionotophoresis 4mg /ml Dexamethasone, and Manual therapy  PLAN FOR NEXT SESSION:  Continue with interventions to promote arm-swing and large-amplitude transfers and stepping drills. Incorporate dual tasking with both dual motor tasks and cognitive tasks with gait/stepping. Progress postural control with most impairment in LLE. Advance HEP with successive PT visits.    Maylon Peppers, PT, DPT Physical Therapist - [place]  [facility]  9:01 AM,12/04/23

## 2023-12-04 ENCOUNTER — Ambulatory Visit: Payer: PPO

## 2023-12-04 ENCOUNTER — Encounter: Payer: Self-pay | Admitting: Physical Therapy

## 2023-12-04 DIAGNOSIS — R2689 Other abnormalities of gait and mobility: Secondary | ICD-10-CM

## 2023-12-04 DIAGNOSIS — R2681 Unsteadiness on feet: Secondary | ICD-10-CM

## 2023-12-04 DIAGNOSIS — R262 Difficulty in walking, not elsewhere classified: Secondary | ICD-10-CM

## 2023-12-04 DIAGNOSIS — M6281 Muscle weakness (generalized): Secondary | ICD-10-CM

## 2023-12-05 ENCOUNTER — Ambulatory Visit (INDEPENDENT_AMBULATORY_CARE_PROVIDER_SITE_OTHER): Payer: No Typology Code available for payment source

## 2023-12-05 DIAGNOSIS — R001 Bradycardia, unspecified: Secondary | ICD-10-CM

## 2023-12-05 LAB — CUP PACEART REMOTE DEVICE CHECK
Battery Remaining Longevity: 158 mo
Battery Voltage: 3.1 V
Brady Statistic AP VP Percent: 0.25 %
Brady Statistic AP VS Percent: 88.11 %
Brady Statistic AS VP Percent: 0 %
Brady Statistic AS VS Percent: 11.63 %
Brady Statistic RA Percent Paced: 88.44 %
Brady Statistic RV Percent Paced: 0.25 %
Date Time Interrogation Session: 20250127235731
Implantable Lead Connection Status: 753985
Implantable Lead Connection Status: 753985
Implantable Lead Implant Date: 20240730
Implantable Lead Implant Date: 20240730
Implantable Lead Location: 753859
Implantable Lead Location: 753860
Implantable Lead Model: 5076
Implantable Lead Model: 5076
Implantable Pulse Generator Implant Date: 20240730
Lead Channel Impedance Value: 323 Ohm
Lead Channel Impedance Value: 399 Ohm
Lead Channel Impedance Value: 456 Ohm
Lead Channel Impedance Value: 494 Ohm
Lead Channel Pacing Threshold Amplitude: 0.5 V
Lead Channel Pacing Threshold Amplitude: 0.75 V
Lead Channel Pacing Threshold Pulse Width: 0.4 ms
Lead Channel Pacing Threshold Pulse Width: 0.4 ms
Lead Channel Sensing Intrinsic Amplitude: 1.375 mV
Lead Channel Sensing Intrinsic Amplitude: 1.375 mV
Lead Channel Sensing Intrinsic Amplitude: 11.375 mV
Lead Channel Sensing Intrinsic Amplitude: 11.375 mV
Lead Channel Setting Pacing Amplitude: 1.75 V
Lead Channel Setting Pacing Amplitude: 2 V
Lead Channel Setting Pacing Pulse Width: 0.4 ms
Lead Channel Setting Sensing Sensitivity: 1.2 mV
Zone Setting Status: 755011

## 2023-12-06 NOTE — Therapy (Signed)
OUTPATIENT PHYSICAL THERAPY TREATMENT   Patient Name: Roger Sanchez MRN: [id number] DOB:10-29-1946, 78 y.o., male Today's Date: 12/07/2023   PT End of Session - 12/07/23 0814     Visit Number 5    Number of Visits 21    Date for PT Re-Evaluation 01/31/24    Authorization Type Initial eval 11/22/23    PT Start Time 0815    PT Stop Time 0857    PT Time Calculation (min) 42 min    Equipment Utilized During Treatment Gait belt    Activity Tolerance Patient tolerated treatment well    Behavior During Therapy Flat affect                 Past Medical History:  Diagnosis Date   Arthralgia    shoulder    Asthma    Asymmetrical hearing loss    Atypical chest pain    Brachial plexus disorders    Bradycardia    Carpal tunnel syndrome    Cerebral infarction (HCC)    Closed fracture of wrist    Exposure to Agent Orange    GERD (gastroesophageal reflux disease)    HOH (hard of hearing)    bilateral   Hyperlipidemia    Hypertrophy of prostate    benign    [name] (HCC)    Low back pain    Lumbar spine pain    compression fracture    Malignant neoplasm (HCC)    skin    Metatarsalgia    Osteopenia    Plantar fibromatosis    Stroke (HCC) 78 yrs old   no residual effects   Transient ischemic attack    Trigger finger    Past Surgical History:  Procedure Laterality Date   BACK SURGERY     BROW LIFT Bilateral 03/26/2021   Procedure: BLEPHAROPLASTY UPPER EYELID; W/EXCESS SKIN BROW PTOSIS REPAIR  BLEPHAROPTOSIS REPAIR; RESECT EX BILATERAL;  Surgeon: Imagene Riches, MD;  Location: [facility];  Service: Ophthalmology;  Laterality: Bilateral;   LOOP RECORDER REMOVAL N/A 06/06/2023   Procedure: LOOP RECORDER REMOVAL;  Surgeon: Duke Salvia, MD;  [facility]: [facility];  Service: Cardiovascular;  Laterality: N/A;   PACEMAKER IMPLANT N/A 06/06/2023   Procedure: PACEMAKER IMPLANT;  Surgeon: Duke Salvia, MD;  Location: [facility] INVASIVE CV LAB;   Service: Cardiovascular;  Laterality: N/A;   WRIST SURGERY Right    Patient Active Problem List   Diagnosis Date Noted   Lightheadedness 05/08/2023   Implantable loop recorder present 03/31/2023   NSVT (nonsustained ventricular tachycardia) (HCC) 02/13/2023   SVT (supraventricular tachycardia) - non sustained 02/13/2023   Near syncope 12/13/2022   [name] (HCC) 12/13/2022   Sinus bradycardia 12/13/2022   History of CVA (cerebrovascular accident) 12/13/2022   Hyperlipidemia 12/13/2022   Dyspnea on exertion 12/13/2022    PCP: Louanne Skye, MD  REFERRING PROVIDER: Janice Coffin, PA-C  REFERRING DIAGNOSIS:  G31.83 (ICD-10-CM) - Neurocognitive disorder with Lewy bodies  F02.80 (ICD-10-CM) - Dementia in other diseases classified elsewhere, unspecified severity, without behavioral disturbance, psychotic disturbance, mood disturbance, and anxiety    THERAPY DIAG: Muscle weakness (generalized)  Unsteadiness on feet  Other abnormalities of gait and mobility  Difficulty in walking, not elsewhere classified  ONSET DATE: 05/21/24  FOLLOW UP APPT WITH PROVIDER: Pt is unsure when he has appointment; he states he does have f/u    Pertinent History: Pt is a 78 year old male with Hx of Lewy Body Demantia and Parkinsonism.  Past medical history is significant for [name], TIA, CVA in 2013, asymmetrical hearing loss, dypsnea on exertion, anxiety, HLD, asthma. Pt wants to walk up to 5 miles per day; he walks up to 2-3 mi presently (pt walks around neighborhood streets mostly). Patient reports he did use walking stick, but using it has irritated his back (known arthritis per patient). Patient has diplopia and reports blurry vision at a distance. Pt likes to walk in woods and he feels he does okay with stairs. Janice Coffin, PA-C referred pt to [facility] PT for LSVT BIG intervention.    Pain: Yes; comorbid back pain/OA Numbness/Tingling: No Focal Weakness: No, not focally; he  states he doesn't get out of chair as easily as he used to.  Recent changes in overall health/medication: Yes; steadily worsening [name]  Prior history of physical therapy for balance:  Yes; Pt following CVA 12-13 years ago  Falls: Has patient fallen in last 6 months? Yes, Number of falls: 1 - taking out recyling, descending stairs without rail  carrying recycling into garage Directional pattern for falls: Yes; falling forward when going down stairs and when he tripped one year ago   Imaging: Yes ;  CLINICAL DATA:  Transient ischemic attack (TIA). Neuro deficit, acute, stroke suspected.   EXAM: MRI HEAD WITHOUT CONTRAST   TECHNIQUE: Multiplanar, multiecho pulse sequences of the brain and surrounding structures were obtained without intravenous contrast.   COMPARISON:  Head CT 12/13/2022 and MRI 02/02/2022   FINDINGS: Brain: There is no evidence of an acute infarct, mass, midline shift, or extra-axial fluid collection. Small T2 hyperintensities in the cerebral white matter bilaterally are unchanged from the prior MRI and are nonspecific but compatible with mild chronic small vessel ischemic disease. Chronic medial left cerebellar infarcts are unchanged from the prior MRI, with associated hemosiderin deposition again noted. Mild cerebral atrophy is within normal limits for age.   Vascular: Unchanged abnormal appearance of the distal left vertebral artery likely reflecting chronic occlusion. Other major intracranial vascular flow voids are preserved.   Skull and upper cervical spine: Unremarkable bone marrow signal.   Sinuses/Orbits: Unremarkable orbits. Paranasal sinuses and mastoid air cells are clear.   Other: None.   IMPRESSION: 1. No acute intracranial abnormality. 2. Mild chronic small vessel ischemic disease. 3. Chronic left cerebellar infarcts.    Prior level of function: Independent Occupational demands: Retired Presenter, broadcasting: Walking routine; pt used to  enjoy fishing   Red flags (bowel/bladder changes, saddle paresthesia, personal history of cancer, h/o spinal tumors, h/o compression fx, h/o abdominal aneurysm, abdominal pain, chills/fever, night sweats, nausea, vomiting, unrelenting pain): Negative  Precautions: Fall history, fall risk; pt has pacemaker  Weight Bearing Restrictions: No  Living Environment Lives with: lives with their spouse Lives in: House/apartment; 4-5 steps with rail on R side going up.  lives in house 4-5 STE with rail remodeled bathroom with seat available (does not use) 1 level with bonus room - 12 steps with rail (where his bedroom is located) grandchildren local- enjoys playing "horse" basketball with them    Patient Goals: Maintain his level of function/independence      OBJECTIVE (data from initial evaluation unless otherwise dated):    Patient Surveys  FOTO: 22 (risk-adjusted 55), predicted improvement to 9   Cognition Patient is oriented to person, place, and time.  Recent memory is intact.  Remote memory is moderately impaired.  Attention span and concentration are intact.  Expressive speech is intact, hypophonic.  Patient's fund of  knowledge is within normal limits for educational level.     Gross Musculoskeletal Assessment/Observation Tremor: No resting tremor/intention tremor Bulk: Normal Tone: Hypotonic  Mask face, hypophonia.    GAIT: Distance walked: 160 ft  Assistive device utilized: None Level of assistance: SBA Comments: Good heel strike and velocity/cadence. Absent arm swing and rigid trunk.    Posture: Rounded shoulder, forward head, trunk remains slightly flexed in sitting/standing    LE MMT:  MMT (out of 5) Right 11/22/2023 Left 11/22/2023  Hip flexion 5 5  Hip extension    Hip abduction (seated) 5 5  Hip adduction (seated) 5 5  Hip internal rotation    Hip external rotation    Knee flexion 5 5  Knee extension 5 5  Ankle dorsiflexion 5 5  Ankle  plantarflexion    Ankle inversion    Ankle eversion    (* = pain; Blank rows = not tested)   Sensation Grossly intact to light touch bilateral LEs as determined by testing dermatomes L2-S2. Proprioception, and hot/cold testing deferred on this date.   Reflexes Deferred   Cranial Nerves Visual acuity and visual fields are intact  Extraocular muscles are intact  -pt exhibits convergence insufficiency  Facial sensation is intact bilaterally  Facial strength is intact bilaterally  Hearing loss is mild as tested by gross conversation Palate elevates midline, normal phonation  Shoulder shrug strength is intact  Tongue protrudes slightly L    Coordination/Cerebellar (updated 11/23/23) Finger to Nose: WNL Heel to Shin: WNL Rapid alternating movements: UE impaired, LE WNL Finger Opposition: WNL Pronator Drift: Positive on L upper limb   FUNCTIONAL OUTCOME MEASURES (baseline measure updated 11/23/23)   Results Comments  BERG 11/23/23: 53/56 Above fall risk cut-off score  FGA 11/23/23: 27/30 Above fall risk cut-off score  Mini-BESTest 11/23/23: 24/28 Above fall risk cut-off score  TUG 11/22/23: 8.3 seconds Well under cut-off score for various populations  5TSTS 11/22/23: 9.5 seconds Slight backward LOB during 4th rep, pt below cut-off time   (Blank rows = not tested)   FUNCTIONAL TASKS  Stairs: pt demonstrates reciprocal stair ascent with no handrail support, no loss of balance and sound toe clearance with each successive step Sit to stand: pt performs readily with no upper limb support, intermittently pushes posterior thighs on chair to assist and has one incident of mild posterior LOB upon attaining standing position     TODAY'S TREATMENT     SUBJECTIVE STATEMENT:   Patient reports he is doing well with his exercises and is doing them once a day.    Neuromuscular Re-education  Gait training with 4# AW on ankles and 2# AW on wrists with cues for over emphasizing arm  swing  High marching with 4# AW at agility ladder x 3 laps   Tandem stance with one foot on airex and other foot on 6" step 2 x 30 seconds leading with each LE   Hurdle fwd step over 12" and 6" x 4 laps with no UE support    Not today:  Activity Description: Standing on level surface with 3 pods placed on mirror (center, L, and R - arms length out) and 3 pods placed on ground (center, L, and R) Activity Setting:  The Blaze Pod Random setting was chosen to enhance cognitive processing and agility, providing an unpredictable environment to simulate real-world scenarios, and fostering quick reactions and adaptability.  Number of Pods:  6 Cycles/Sets:  3 Duration (Time or Hit Count):  1 minute each  Activity Description: Standing on airex pad with 3 pods placed on mirror (center, L, and R - arms length out) and 3 pods placed on ground (center, L, and R) Activity Setting:  The Blaze Pod Random setting was chosen to enhance cognitive processing and agility, providing an unpredictable environment to simulate real-world scenarios, and fostering quick reactions and adaptability.  Number of Pods:  6  Cycles/Sets:  3 Duration (Time or Hit Count):  1 minute each     Therapeutic Exercise - improved strength as needed to improve performance of CKC activities/functional movements and as needed for power production to prevent fall during episode of large postural perturbation   NuStep; Level 5-4, x 8 minutes  Resisted gait at Nautilus 80# fwd/bwd/lateral x 3 laps each direction  Sit to stand with 8# medicine ball chest press and feet on airex 2 x 10   PATIENT EDUCATION: Discussed goals of PT and role of large-amplitude movements to improve movement impairments associated with LBD/Parkinsonism. We encouraged continued work on [employer] as given by [employer] and expectation for updated HEP with future visits.     PATIENT EDUCATION:  Education details: see above for patient education details Person educated:  Patient Education method: Explanation Education comprehension: verbalized understanding   HOME EXERCISE PROGRAM: *Formal HEP to be updated with subsequent PT visits  *Pt has the following home exercises from [place] following 10/30/23 PT assessment at [facility] center:  1) step and reach forward - hands wide 10x 2) step and reach lateral- hands wide x 10 3) sit->stand hands wide- 10x 4) SLS at chair- 5x/10 sec consistently    ASSESSMENT:  CLINICAL IMPRESSION:    Patient arrives to treatment session motivated to participate. Session focused on BLE strengthening and balance. Tolerated treatment session well with increase in activity and resistance. Continues to requires cues for increasing arm swing during gait. Patient will continue to benefit from skilled therapy to address remaining deficits in order to improve quality of life and maintain CLOF.    REHAB POTENTIAL: Good  CLINICAL DECISION MAKING: Unstable/unpredictable  EVALUATION COMPLEXITY: High   GOALS: Goals reviewed with patient? Yes  SHORT TERM GOALS: Target date: 12/14/2023  Pt will be independent with HEP in order to improve strength and balance in order to decrease fall risk and improve function at home. Baseline: 11/22/23: Formal HEP to be initiated at second follow-up visit.  Goal status: INITIAL   LONG TERM GOALS: Target date: 01/18/2024  Pt will increase FOTO to at least 77 to demonstrate significant improvement in function at home related to balance  Baseline: 11/22/23: 78 (risk-adjusted FOTO 55) Goal status: INITIAL  2.  Pt will improve BERG by at least 3 points in order to demonstrate clinically significant improvement in balance.   Baseline: 11/22/23: To be completed next visit.    11/23/23: 53/56 Goal status: INITIAL  3.  Pt will improve Mini-BESTest by at least 4 points in order to demonstrate clinically significant improvement in balance and decreased risk for falls    Baseline: 11/22/23: To be completed next visit.     11/23/23: 24/28 Goal status: INITIAL   4.  Pt will improve FGA by at least 5 points in order to demonstrate clinically significant improvement in balance and decreased risk for falls       Baseline: 11/22/23: To be completed next visit.    11/23/23: 27/30 Goal status: INITIAL    PLAN: PT FREQUENCY: 2x/week  PT DURATION: 6-8 weeks  PLANNED INTERVENTIONS: Therapeutic exercises, Therapeutic activity, Neuromuscular  re-education, Balance training, Gait training, Patient/Family education, Joint manipulation, Joint mobilization, Canalith repositioning, Aquatic Therapy, Dry Needling, Cognitive remediation, Electrical stimulation, Spinal manipulation, Spinal mobilization, Cryotherapy, Moist heat, Traction, Ultrasound, Ionotophoresis 4mg /ml Dexamethasone, and Manual therapy  PLAN FOR NEXT SESSION:  Continue with interventions to promote arm-swing and large-amplitude transfers and stepping drills. Incorporate dual tasking with both dual motor tasks and cognitive tasks with gait/stepping. Progress postural control with most impairment in LLE. Advance HEP with successive PT visits.    Maylon Peppers, PT, DPT Physical Therapist - [place]  [facility]  8:15 AM,12/07/23

## 2023-12-07 ENCOUNTER — Ambulatory Visit: Payer: PPO | Admitting: Physical Therapy

## 2023-12-07 DIAGNOSIS — R2689 Other abnormalities of gait and mobility: Secondary | ICD-10-CM

## 2023-12-07 DIAGNOSIS — M6281 Muscle weakness (generalized): Secondary | ICD-10-CM

## 2023-12-07 DIAGNOSIS — R262 Difficulty in walking, not elsewhere classified: Secondary | ICD-10-CM

## 2023-12-07 DIAGNOSIS — R2681 Unsteadiness on feet: Secondary | ICD-10-CM

## 2023-12-11 NOTE — Therapy (Addendum)
 OUTPATIENT PHYSICAL THERAPY TREATMENT   Patient Name: Roger Sanchez MRN: [id number] DOB:July 24, 1946, 78 y.o., male Today's Date: 12/12/2023   PT End of Session - 12/12/23 0855     Visit Number 6    Number of Visits 21    Date for PT Re-Evaluation 01/31/24    Authorization Type Initial eval 11/22/23    PT Start Time 0900    PT Stop Time 0942    PT Time Calculation (min) 42 min    Equipment Utilized During Treatment Gait belt    Activity Tolerance Patient tolerated treatment well    Behavior During Therapy Flat affect                  Past Medical History:  Diagnosis Date   Arthralgia    shoulder    Asthma    Asymmetrical hearing loss    Atypical chest pain    Brachial plexus disorders    Bradycardia    Carpal tunnel syndrome    Cerebral infarction (HCC)    Closed fracture of wrist    Exposure to Agent Orange    GERD (gastroesophageal reflux disease)    HOH (hard of hearing)    bilateral   Hyperlipidemia    Hypertrophy of prostate    benign    [name] (HCC)    Low back pain    Lumbar spine pain    compression fracture    Malignant neoplasm (HCC)    skin    Metatarsalgia    Osteopenia    Plantar fibromatosis    Stroke (HCC) 78 yrs old   no residual effects   Transient ischemic attack    Trigger finger    Past Surgical History:  Procedure Laterality Date   BACK SURGERY     BROW LIFT Bilateral 03/26/2021   Procedure: BLEPHAROPLASTY UPPER EYELID; W/EXCESS SKIN BROW PTOSIS REPAIR  BLEPHAROPTOSIS REPAIR; RESECT EX BILATERAL;  Surgeon: Ashley Greig HERO, MD;  Location: [facility];  Service: Ophthalmology;  Laterality: Bilateral;   LOOP RECORDER REMOVAL N/A 06/06/2023   Procedure: LOOP RECORDER REMOVAL;  Surgeon: Fernande Elspeth BROCKS, MD;  Location: [facility] INVASIVE CV LAB;  Service: Cardiovascular;  Laterality: N/A;   PACEMAKER IMPLANT N/A 06/06/2023   Procedure: PACEMAKER IMPLANT;  Surgeon: Fernande Elspeth BROCKS, MD;  Location: [facility] INVASIVE CV LAB;   Service: Cardiovascular;  Laterality: N/A;   WRIST SURGERY Right    Patient Active Problem List   Diagnosis Date Noted   Lightheadedness 05/08/2023   Implantable loop recorder present 03/31/2023   NSVT (nonsustained ventricular tachycardia) (HCC) 02/13/2023   SVT (supraventricular tachycardia) - non sustained 02/13/2023   Near syncope 12/13/2022   [name] (HCC) 12/13/2022   Sinus bradycardia 12/13/2022   History of CVA (cerebrovascular accident) 12/13/2022   Hyperlipidemia 12/13/2022   Dyspnea on exertion 12/13/2022    PCP: Mitchell Bolt, MD  REFERRING PROVIDER: Kaitlin Paich, PA-C  REFERRING DIAGNOSIS:  G31.83 (ICD-10-CM) - Neurocognitive disorder with Lewy bodies  F02.80 (ICD-10-CM) - Dementia in other diseases classified elsewhere, unspecified severity, without behavioral disturbance, psychotic disturbance, mood disturbance, and anxiety    THERAPY DIAG: Muscle weakness (generalized)  Unsteadiness on feet  Other abnormalities of gait and mobility  Difficulty in walking, not elsewhere classified  ONSET DATE: 05/21/24  FOLLOW UP APPT WITH PROVIDER: Pt is unsure when he has appointment; he states he does have f/u    Pertinent History: Pt is a 78 year old male with Hx of Lewy Body Demantia and  Parkinsonism. Past medical history is significant for [name], TIA, CVA in 2013, asymmetrical hearing loss, dypsnea on exertion, anxiety, HLD, asthma. Pt wants to walk up to 5 miles per day; he walks up to 2-3 mi presently (pt walks around neighborhood streets mostly). Patient reports he did use walking stick, but using it has irritated his back (known arthritis per patient). Patient has diplopia and reports blurry vision at a distance. Pt likes to walk in woods and he feels he does okay with stairs. Kaitlin Paich, PA-C referred pt to [facility] PT for LSVT BIG intervention.    Pain: Yes; comorbid back pain/OA Numbness/Tingling: No Focal Weakness: No, not focally; he  states he doesn't get out of chair as easily as he used to.  Recent changes in overall health/medication: Yes; steadily worsening [name]  Prior history of physical therapy for balance:  Yes; Pt following CVA 12-13 years ago  Falls: Has patient fallen in last 6 months? Yes, Number of falls: 1 - taking out recyling, descending stairs without rail  carrying recycling into garage Directional pattern for falls: Yes; falling forward when going down stairs and when he tripped one year ago   Imaging: Yes ;  CLINICAL DATA:  Transient ischemic attack (TIA). Neuro deficit, acute, stroke suspected.   EXAM: MRI HEAD WITHOUT CONTRAST   TECHNIQUE: Multiplanar, multiecho pulse sequences of the brain and surrounding structures were obtained without intravenous contrast.   COMPARISON:  Head CT 12/13/2022 and MRI 02/02/2022   FINDINGS: Brain: There is no evidence of an acute infarct, mass, midline shift, or extra-axial fluid collection. Small T2 hyperintensities in the cerebral white matter bilaterally are unchanged from the prior MRI and are nonspecific but compatible with mild chronic small vessel ischemic disease. Chronic medial left cerebellar infarcts are unchanged from the prior MRI, with associated hemosiderin deposition again noted. Mild cerebral atrophy is within normal limits for age.   Vascular: Unchanged abnormal appearance of the distal left vertebral artery likely reflecting chronic occlusion. Other major intracranial vascular flow voids are preserved.   Skull and upper cervical spine: Unremarkable bone marrow signal.   Sinuses/Orbits: Unremarkable orbits. Paranasal sinuses and mastoid air cells are clear.   Other: None.   IMPRESSION: 1. No acute intracranial abnormality. 2. Mild chronic small vessel ischemic disease. 3. Chronic left cerebellar infarcts.    Prior level of function: Independent Occupational demands: Retired Presenter, [employer]: Walking routine; pt used to  enjoy fishing   Red flags (bowel/bladder changes, saddle paresthesia, personal history of cancer, h/o spinal tumors, h/o compression fx, h/o abdominal aneurysm, abdominal pain, chills/fever, night sweats, nausea, vomiting, unrelenting pain): Negative  Precautions: Fall history, fall risk; pt has pacemaker  Weight Bearing Restrictions: No  Living Environment Lives with: lives with their spouse Lives in: House/apartment; 4-5 steps with rail on R side going up.  lives in house 4-5 STE with rail remodeled bathroom with seat available (does not use) 1 level with bonus room - 12 steps with rail (where his bedroom is located) grandchildren local- enjoys playing horse basketball with them    Patient Goals: Maintain his level of function/independence      OBJECTIVE (data from initial evaluation unless otherwise dated):    Patient Surveys  FOTO: 36 (risk-adjusted 55), predicted improvement to 57   Cognition Patient is oriented to person, place, and time.  Recent memory is intact.  Remote memory is moderately impaired.  Attention span and concentration are intact.  Expressive speech is intact, hypophonic.  Patient's fund  of knowledge is within normal limits for educational level.     Gross Musculoskeletal Assessment/Observation Tremor: No resting tremor/intention tremor Bulk: Normal Tone: Hypotonic  Mask face, hypophonia.    GAIT: Distance walked: 160 ft  Assistive device utilized: None Level of assistance: SBA Comments: Good heel strike and velocity/cadence. Absent arm swing and rigid trunk.    Posture: Rounded shoulder, forward head, trunk remains slightly flexed in sitting/standing    LE MMT:  MMT (out of 5) Right 11/22/2023 Left 11/22/2023  Hip flexion 5 5  Hip extension    Hip abduction (seated) 5 5  Hip adduction (seated) 5 5  Hip internal rotation    Hip external rotation    Knee flexion 5 5  Knee extension 5 5  Ankle dorsiflexion 5 5  Ankle  plantarflexion    Ankle inversion    Ankle eversion    (* = pain; Blank rows = not tested)   Sensation Grossly intact to light touch bilateral LEs as determined by testing dermatomes L2-S2. Proprioception, and hot/cold testing deferred on this date.   Reflexes Deferred   Cranial Nerves Visual acuity and visual fields are intact  Extraocular muscles are intact  -pt exhibits convergence insufficiency  Facial sensation is intact bilaterally  Facial strength is intact bilaterally  Hearing loss is mild as tested by gross conversation Palate elevates midline, normal phonation  Shoulder shrug strength is intact  Tongue protrudes slightly L    Coordination/Cerebellar (updated 11/23/23) Finger to Nose: WNL Heel to Shin: WNL Rapid alternating movements: UE impaired, LE WNL Finger Opposition: WNL Pronator Drift: Positive on L upper limb   FUNCTIONAL OUTCOME MEASURES (baseline measure updated 11/23/23)   Results Comments  BERG 11/23/23: 53/56 Above fall risk cut-off score  FGA 11/23/23: 27/30 Above fall risk cut-off score  Mini-BESTest 11/23/23: 24/28 Above fall risk cut-off score  TUG 11/22/23: 8.3 seconds Well under cut-off score for various populations  5TSTS 11/22/23: 9.5 seconds Slight backward LOB during 4th rep, pt below cut-off time   (Blank rows = not tested)   FUNCTIONAL TASKS  Stairs: pt demonstrates reciprocal stair ascent with no handrail support, no loss of balance and sound toe clearance with each successive step Sit to stand: pt performs readily with no upper limb support, intermittently pushes posterior thighs on chair to assist and has one incident of mild posterior LOB upon attaining standing position     TODAY'S TREATMENT     SUBJECTIVE STATEMENT:   Patient reports he is doing well with his exercises. Patient reports walking 2.5-3 miles in the woods over the weekend and felt fatigued.   Neuromuscular Re-education   Hurdle fwd step over 12 and 6 x 4 laps  with 4# AW with no UE support   Hurdle lateral step over 12 and 6 x 4 laps with 4# AW with no UE support   Alternating cone taps x 20 each LE   Walking alternating cone taps (8 cones placed on edges of blue agility ladder) x 3 laps D/B   Activity Description: 6 pods on mirror at various heights and outside of BOS, patient sidestepping on airex balance beam (R hand = orange; L hand = green) Activity Setting:  The Blaze Pod Random setting was chosen to enhance cognitive processing and agility, providing an unpredictable environment to simulate real-world scenarios, and fostering quick reactions and adaptability.  Number of Pods:  6 Cycles/Sets:  3 Duration (Time or Hit Count):  1 minute    Not today:  Activity Description: Standing on level surface with 3 pods placed on mirror (center, L, and R - arms length out) and 3 pods placed on ground (center, L, and R) Activity Setting:  The Blaze Pod Random setting was chosen to enhance cognitive processing and agility, providing an unpredictable environment to simulate real-world scenarios, and fostering quick reactions and adaptability.  Number of Pods:  6 Cycles/Sets:  3 Duration (Time or Hit Count):  1 minute each   Activity Description: Standing on airex pad with 3 pods placed on mirror (center, L, and R - arms length out) and 3 pods placed on ground (center, L, and R) Activity Setting:  The Blaze Pod Random setting was chosen to enhance cognitive processing and agility, providing an unpredictable environment to simulate real-world scenarios, and fostering quick reactions and adaptability.  Number of Pods:  6  Cycles/Sets:  3 Duration (Time or Hit Count):  1 minute each     Therapeutic Activity - improved strength as needed to improve performance of CKC activities/functional movements and as needed for power production to prevent fall during episode of large postural perturbation   NuStep; Level 4, x 8 minutes to improve cardiorespiratory  endurance and overall strength   Sit to stand with 8# medicine ball overhead press 3 x 10  Seated LAQ 2 x 15 with 4# AW  Seated heel/toe raises 2 x 20 each LE with 4# AW   Resisted gait at [place] 80# fwd/bwd/lateral x 3 laps each direction   PATIENT EDUCATION: Discussed goals of PT and role of large-amplitude movements to improve movement impairments associated with LBD/Parkinsonism. We encouraged continued work on [employer] as given by [employer] and expectation for updated HEP with future visits.     PATIENT EDUCATION:  Education details: see above for patient education details Person educated: Patient Education method: Explanation Education comprehension: verbalized understanding   HOME EXERCISE PROGRAM: *Formal HEP to be updated with subsequent PT visits  *Pt has the following home exercises from [place] following 10/30/23 PT assessment at [facility] center:  1) step and reach forward - hands wide 10x 2) step and reach lateral- hands wide x 10 3) sit->stand hands wide- 10x 4) SLS at chair- 5x/10 sec consistently    ASSESSMENT:  CLINICAL IMPRESSION:    Patient arrives to treatment session motivated to participate. Session focused on BLE strengthening and balance. Tolerated treatment session well with increase in activity and resistance. The patient demonstrated significant progress while utilizing [employer], showcasing improved coordination, balance, and cognitive function. The incorporation of dual-tasking technology with color recognition and association with specific movements in Blaze Pods was strategically chosen to provide a dynamic training environment, enabling the patient to engage in simultaneous physical and cognitive tasks. This unique approach enhances not only their physical abilities but also fosters increased neural connectivity and mental awareness, contributing to a well-rounded and effective rehabilitation and training experience.  Patient will continue to benefit from skilled therapy to  address remaining deficits in order to improve quality of life and maintain CLOF.    REHAB POTENTIAL: Good  CLINICAL DECISION MAKING: Unstable/unpredictable  EVALUATION COMPLEXITY: High   GOALS: Goals reviewed with patient? Yes  SHORT TERM GOALS: Target date: 12/14/2023  Pt will be independent with HEP in order to improve strength and balance in order to decrease fall risk and improve function at home. Baseline: 11/22/23: Formal HEP to be initiated at second follow-up visit.  Goal status: INITIAL   LONG TERM GOALS: Target date: 01/18/2024  Pt will  increase FOTO to at least 77 to demonstrate significant improvement in function at home related to balance  Baseline: 11/22/23: 78 (risk-adjusted FOTO 55) Goal status: INITIAL  2.  Pt will improve BERG by at least 3 points in order to demonstrate clinically significant improvement in balance.   Baseline: 11/22/23: To be completed next visit.    11/23/23: 53/56 Goal status: INITIAL  3.  Pt will improve Mini-BESTest by at least 4 points in order to demonstrate clinically significant improvement in balance and decreased risk for falls    Baseline: 11/22/23: To be completed next visit.    11/23/23: 24/28 Goal status: INITIAL   4.  Pt will improve FGA by at least 5 points in order to demonstrate clinically significant improvement in balance and decreased risk for falls       Baseline: 11/22/23: To be completed next visit.    11/23/23: 27/30 Goal status: INITIAL    PLAN: PT FREQUENCY: 2x/week  PT DURATION: 6-8 weeks  PLANNED INTERVENTIONS: Therapeutic exercises, Therapeutic activity, Neuromuscular re-education, Balance training, Gait training, Patient/Family education, Joint manipulation, Joint mobilization, Canalith repositioning, Aquatic Therapy, Dry Needling, Cognitive remediation, Electrical stimulation, Spinal manipulation, Spinal mobilization, Cryotherapy, Moist heat, Traction, Ultrasound, Ionotophoresis 4mg /ml Dexamethasone, and Manual  therapy  PLAN FOR NEXT SESSION:  Continue with interventions to promote arm-swing and large-amplitude transfers and stepping drills. Incorporate dual tasking with both dual motor tasks and cognitive tasks with gait/stepping. Progress postural control with most impairment in LLE. Advance HEP with successive PT visits.    Maryanne Finder, PT, DPT Physical Therapist - [place]  [facility]  8:56 AM,12/12/23

## 2023-12-12 ENCOUNTER — Encounter: Payer: Self-pay | Admitting: Physical Therapy

## 2023-12-12 ENCOUNTER — Ambulatory Visit: Payer: PPO | Attending: Student

## 2023-12-12 DIAGNOSIS — R2681 Unsteadiness on feet: Secondary | ICD-10-CM | POA: Insufficient documentation

## 2023-12-12 DIAGNOSIS — M6281 Muscle weakness (generalized): Secondary | ICD-10-CM | POA: Insufficient documentation

## 2023-12-12 DIAGNOSIS — R262 Difficulty in walking, not elsewhere classified: Secondary | ICD-10-CM | POA: Diagnosis present

## 2023-12-12 DIAGNOSIS — R2689 Other abnormalities of gait and mobility: Secondary | ICD-10-CM | POA: Insufficient documentation

## 2023-12-14 ENCOUNTER — Ambulatory Visit: Payer: PPO | Admitting: Physical Therapy

## 2023-12-14 DIAGNOSIS — R2681 Unsteadiness on feet: Secondary | ICD-10-CM

## 2023-12-14 DIAGNOSIS — R2689 Other abnormalities of gait and mobility: Secondary | ICD-10-CM

## 2023-12-14 DIAGNOSIS — M6281 Muscle weakness (generalized): Secondary | ICD-10-CM

## 2023-12-14 DIAGNOSIS — R262 Difficulty in walking, not elsewhere classified: Secondary | ICD-10-CM

## 2023-12-14 NOTE — Therapy (Signed)
 OUTPATIENT PHYSICAL THERAPY TREATMENT   Patient Name: Roger Sanchez MRN: [id number] DOB:Sep 14, 1946, 78 y.o., male Today's Date: 12/14/2023   PT End of Session - 12/14/23 0942     Visit Number 7    Number of Visits 21    Date for PT Re-Evaluation 01/31/24    PT Start Time 0904    PT Stop Time 0945    PT Time Calculation (min) 41 min    Equipment Utilized During Treatment Gait belt    Activity Tolerance Patient tolerated treatment well;No increased pain    Behavior During Therapy WFL for tasks assessed/performed                  Past Medical History:  Diagnosis Date   Arthralgia    shoulder    Asthma    Asymmetrical hearing loss    Atypical chest pain    Brachial plexus disorders    Bradycardia    Carpal tunnel syndrome    Cerebral infarction (HCC)    Closed fracture of wrist    Exposure to Agent Orange    GERD (gastroesophageal reflux disease)    HOH (hard of hearing)    bilateral   Hyperlipidemia    Hypertrophy of prostate    benign    [name] (HCC)    Low back pain    Lumbar spine pain    compression fracture    Malignant neoplasm (HCC)    skin    Metatarsalgia    Osteopenia    Plantar fibromatosis    Stroke [facility]) 78 yrs old   no residual effects   Transient ischemic attack    Trigger finger    Past Surgical History:  Procedure Laterality Date   BACK SURGERY     BROW LIFT Bilateral 03/26/2021   Procedure: BLEPHAROPLASTY UPPER EYELID; W/EXCESS SKIN BROW PTOSIS REPAIR  BLEPHAROPTOSIS REPAIR; RESECT EX BILATERAL;  Surgeon: Ashley Greig HERO, MD;  Location: [facility];  Service: Ophthalmology;  Laterality: Bilateral;   LOOP RECORDER REMOVAL N/A 06/06/2023   Procedure: LOOP RECORDER REMOVAL;  Surgeon: Fernande Elspeth BROCKS, MD;  Location: [facility] INVASIVE CV LAB;  Service: Cardiovascular;  Laterality: N/A;   PACEMAKER IMPLANT N/A 06/06/2023   Procedure: PACEMAKER IMPLANT;  Surgeon: Fernande Elspeth BROCKS, MD;  Location: [facility] INVASIVE CV LAB;   Service: Cardiovascular;  Laterality: N/A;   WRIST SURGERY Right    Patient Active Problem List   Diagnosis Date Noted   Lightheadedness 05/08/2023   Implantable loop recorder present 03/31/2023   NSVT (nonsustained ventricular tachycardia) (HCC) 02/13/2023   SVT (supraventricular tachycardia) - non sustained 02/13/2023   Near syncope 12/13/2022   [name] (HCC) 12/13/2022   Sinus bradycardia 12/13/2022   History of CVA (cerebrovascular accident) 12/13/2022   Hyperlipidemia 12/13/2022   Dyspnea on exertion 12/13/2022    PCP: Mitchell Bolt, MD  REFERRING PROVIDER: Kaitlin Paich, PA-C  REFERRING DIAGNOSIS:  G31.83 (ICD-10-CM) - Neurocognitive disorder with Lewy bodies  F02.80 (ICD-10-CM) - Dementia in other diseases classified elsewhere, unspecified severity, without behavioral disturbance, psychotic disturbance, mood disturbance, and anxiety    THERAPY DIAG: Muscle weakness (generalized)  Unsteadiness on feet  Other abnormalities of gait and mobility  Difficulty in walking, not elsewhere classified  ONSET DATE: 05/21/24  FOLLOW UP APPT WITH PROVIDER: Pt is unsure when he has appointment; he states he does have f/u    Subjective:  Pt doing well already walked this morning. No pain today.   Pertinent History: Pt is  a 78 year old male with Hx of Lewy Body Demantia and Parkinsonism. Past medical history is significant for [name], TIA, CVA in 2013, asymmetrical hearing loss, dypsnea on exertion, anxiety, HLD, asthma. Pt wants to walk up to 5 miles per day; he walks up to 2-3 mi presently (pt walks around neighborhood streets mostly). Patient reports he did use walking stick, but using it has irritated his back (known arthritis per patient). Patient has diplopia and reports blurry vision at a distance. Pt likes to walk in woods and he feels he does okay with stairs. Kaitlin Paich, PA-C referred pt to [facility] PT for LSVT BIG intervention.    Pain: Yes; comorbid  back pain/OA Numbness/Tingling: No Focal Weakness: No, not focally; he states he doesn't get out of chair as easily as he used to.  Recent changes in overall health/medication: Yes; steadily worsening [name]  Prior history of physical therapy for balance:  Yes; Pt following CVA 12-13 years ago  Falls: Has patient fallen in last 6 months? Yes, Number of falls: 1 - taking out recyling, descending stairs without rail  carrying recycling into garage Directional pattern for falls: Yes; falling forward when going down stairs and when he tripped one year ago   Imaging: Yes ;  CLINICAL DATA:  Transient ischemic attack (TIA). Neuro deficit, acute, stroke suspected.   EXAM: MRI HEAD WITHOUT CONTRAST   TECHNIQUE: Multiplanar, multiecho pulse sequences of the brain and surrounding structures were obtained without intravenous contrast.   COMPARISON:  Head CT 12/13/2022 and MRI 02/02/2022   FINDINGS: Brain: There is no evidence of an acute infarct, mass, midline shift, or extra-axial fluid collection. Small T2 hyperintensities in the cerebral white matter bilaterally are unchanged from the prior MRI and are nonspecific but compatible with mild chronic small vessel ischemic disease. Chronic medial left cerebellar infarcts are unchanged from the prior MRI, with associated hemosiderin deposition again noted. Mild cerebral atrophy is within normal limits for age.   Vascular: Unchanged abnormal appearance of the distal left vertebral artery likely reflecting chronic occlusion. Other major intracranial vascular flow voids are preserved.   Skull and upper cervical spine: Unremarkable bone marrow signal.   Sinuses/Orbits: Unremarkable orbits. Paranasal sinuses and mastoid air cells are clear.   Other: None.   IMPRESSION: 1. No acute intracranial abnormality. 2. Mild chronic small vessel ischemic disease. 3. Chronic left cerebellar infarcts.    Prior level of function:  Independent Occupational demands: Retired Presenter, [employer]: Walking routine; pt used to enjoy fishing   Red flags (bowel/bladder changes, saddle paresthesia, personal history of cancer, h/o spinal tumors, h/o compression fx, h/o abdominal aneurysm, abdominal pain, chills/fever, night sweats, nausea, vomiting, unrelenting pain): Negative  Precautions: Fall history, fall risk; pt has pacemaker  Weight Bearing Restrictions: No  Living Environment Lives with: lives with their spouse Lives in: House/apartment; 4-5 steps with rail on R side going up.  lives in house 4-5 STE with rail remodeled bathroom with seat available (does not use) 1 level with bonus room - 12 steps with rail (where his bedroom is located) grandchildren local- enjoys playing horse basketball with them    Patient Goals: Maintain his level of function/independence      OBJECTIVE (data from initial evaluation unless otherwise dated):    Patient Surveys  FOTO: 3 (risk-adjusted 55), predicted improvement to 28   Cognition Patient is oriented to person, place, and time.  Recent memory is intact.  Remote memory is moderately impaired.  Attention span and concentration are  intact.  Expressive speech is intact, hypophonic.  Patient's fund of knowledge is within normal limits for educational level.     Gross Musculoskeletal Assessment/Observation Tremor: No resting tremor/intention tremor Bulk: Normal Tone: Hypotonic  Mask face, hypophonia.    GAIT: Distance walked: 160 ft  Assistive device utilized: None Level of assistance: SBA Comments: Good heel strike and velocity/cadence. Absent arm swing and rigid trunk.    Posture: Rounded shoulder, forward head, trunk remains slightly flexed in sitting/standing    LE MMT:  MMT (out of 5) Right 11/22/2023 Left 11/22/2023  Hip flexion 5 5  Hip extension    Hip abduction (seated) 5 5  Hip adduction (seated) 5 5  Hip internal rotation    Hip external rotation     Knee flexion 5 5  Knee extension 5 5  Ankle dorsiflexion 5 5  Ankle plantarflexion    Ankle inversion    Ankle eversion    (* = pain; Blank rows = not tested)   Sensation Grossly intact to light touch bilateral LEs as determined by testing dermatomes L2-S2. Proprioception, and hot/cold testing deferred on this date.   Reflexes Deferred   Cranial Nerves Visual acuity and visual fields are intact  Extraocular muscles are intact  -pt exhibits convergence insufficiency  Facial sensation is intact bilaterally  Facial strength is intact bilaterally  Hearing loss is mild as tested by gross conversation Palate elevates midline, normal phonation  Shoulder shrug strength is intact  Tongue protrudes slightly L    Coordination/Cerebellar (updated 11/23/23) Finger to Nose: WNL Heel to Shin: WNL Rapid alternating movements: UE impaired, LE WNL Finger Opposition: WNL Pronator Drift: Positive on L upper limb   FUNCTIONAL OUTCOME MEASURES (baseline measure updated 11/23/23)   Results Comments  BERG 11/23/23: 53/56 Above fall risk cut-off score  FGA 11/23/23: 27/30 Above fall risk cut-off score  Mini-BESTest 11/23/23: 24/28 Above fall risk cut-off score  TUG 11/22/23: 8.3 seconds Well under cut-off score for various populations  5TSTS 11/22/23: 9.5 seconds Slight backward LOB during 4th rep, pt below cut-off time   (Blank rows = not tested)   FUNCTIONAL TASKS  Stairs: pt demonstrates reciprocal stair ascent with no handrail support, no loss of balance and sound toe clearance with each successive step Sit to stand: pt performs readily with no upper limb support, intermittently pushes posterior thighs on chair to assist and has one incident of mild posterior LOB upon attaining standing position     TODAY'S TREATMENT     AMB 15 minutes outside med center, [place], our building, stairs, curbsteps.  Balance course circle in gym with variable surface types and heights carrying weighted  items Ball knee dribblign practice Blue phys ball rebouding off wall x20 Trunk twist with latearl ball rebound, alternating sides in hallway   PATIENT EDUCATION:  Education details: see above for patient education details Person educated: Patient Education method: Explanation Education comprehension: verbalized understanding   HOME EXERCISE PROGRAM: *Formal HEP to be updated with subsequent PT visits  *Pt has the following home exercises from [place] following 10/30/23 PT assessment at [facility] center:  1) step and reach forward - hands wide 10x 2) step and reach lateral- hands wide x 10 3) sit->stand hands wide- 10x 4) SLS at chair- 5x/10 sec consistently    ASSESSMENT:  CLINICAL IMPRESSION:    Pt does great with avdanced balance challenges, community walking. MinGuard assist provided for safety and self-righting. Patient will continue to benefit from skilled therapy to address remaining deficits  in order to improve quality of life and maintain CLOF.    REHAB POTENTIAL: Good  CLINICAL DECISION MAKING: Unstable/unpredictable  EVALUATION COMPLEXITY: High   GOALS: Goals reviewed with patient? Yes  SHORT TERM GOALS: Target date: 12/14/2023  Pt will be independent with HEP in order to improve strength and balance in order to decrease fall risk and improve function at home. Baseline: 11/22/23: Formal HEP to be initiated at second follow-up visit.  Goal status: INITIAL   LONG TERM GOALS: Target date: 01/18/2024  Pt will increase FOTO to at least 77 to demonstrate significant improvement in function at home related to balance  Baseline: 11/22/23: 78 (risk-adjusted FOTO 55) Goal status: INITIAL  2.  Pt will improve BERG by at least 3 points in order to demonstrate clinically significant improvement in balance.   Baseline: 11/22/23: To be completed next visit.    11/23/23: 53/56 Goal status: INITIAL  3.  Pt will improve Mini-BESTest by at least 4 points in order to demonstrate clinically  significant improvement in balance and decreased risk for falls    Baseline: 11/22/23: To be completed next visit.    11/23/23: 24/28 Goal status: INITIAL   4.  Pt will improve FGA by at least 5 points in order to demonstrate clinically significant improvement in balance and decreased risk for falls       Baseline: 11/22/23: To be completed next visit.    11/23/23: 27/30 Goal status: INITIAL    PLAN: PT FREQUENCY: 2x/week  PT DURATION: 6-8 weeks  PLANNED INTERVENTIONS: Therapeutic exercises, Therapeutic activity, Neuromuscular re-education, Balance training, Gait training, Patient/Family education, Joint manipulation, Joint mobilization, Canalith repositioning, Aquatic Therapy, Dry Needling, Cognitive remediation, Electrical stimulation, Spinal manipulation, Spinal mobilization, Cryotherapy, Moist heat, Traction, Ultrasound, Ionotophoresis 4mg /ml Dexamethasone, and Manual therapy  PLAN FOR NEXT SESSION:  advanced dynamic balance dual tasking   9:43 AM, 12/14/23 Peggye JAYSON Linear, PT, DPT Physical Therapist - [facility]  [phone] [facility])    9:43 AM,12/14/23

## 2023-12-19 ENCOUNTER — Ambulatory Visit: Payer: PPO

## 2023-12-19 ENCOUNTER — Encounter: Payer: Self-pay | Admitting: Physical Therapy

## 2023-12-19 DIAGNOSIS — M6281 Muscle weakness (generalized): Secondary | ICD-10-CM

## 2023-12-19 DIAGNOSIS — R2689 Other abnormalities of gait and mobility: Secondary | ICD-10-CM

## 2023-12-19 DIAGNOSIS — R262 Difficulty in walking, not elsewhere classified: Secondary | ICD-10-CM

## 2023-12-19 DIAGNOSIS — R2681 Unsteadiness on feet: Secondary | ICD-10-CM

## 2023-12-19 NOTE — Therapy (Signed)
OUTPATIENT PHYSICAL THERAPY TREATMENT   Patient Name: Roger Sanchez MRN: [id number] DOB:[birth year], 78 y.o., male Today's Date: 12/19/2023   PT End of Session - 12/19/23 0853     Visit Number 8    Number of Visits 21    Date for PT Re-Evaluation 01/31/24    PT Start Time 0900    PT Stop Time 0940    PT Time Calculation (min) 40 min    Equipment Utilized During Treatment Gait belt    Activity Tolerance Patient tolerated treatment well;No increased pain    Behavior During Therapy WFL for tasks assessed/performed                   Past Medical History:  Diagnosis Date   Arthralgia    shoulder    Asthma    Asymmetrical hearing loss    Atypical chest pain    Brachial plexus disorders    Bradycardia    Carpal tunnel syndrome    Cerebral infarction (HCC)    Closed fracture of wrist    Exposure to Agent Orange    GERD (gastroesophageal reflux disease)    HOH (hard of hearing)    bilateral   Hyperlipidemia    Hypertrophy of prostate    benign    [name] (HCC)    Low back pain    Lumbar spine pain    compression fracture    Malignant neoplasm (HCC)    skin    Metatarsalgia    Osteopenia    Plantar fibromatosis    Stroke [facility]) 78 yrs old   no residual effects   Transient ischemic attack    Trigger finger    Past Surgical History:  Procedure Laterality Date   BACK SURGERY     BROW LIFT Bilateral 03/26/2021   Procedure: BLEPHAROPLASTY UPPER EYELID; W/EXCESS SKIN BROW PTOSIS REPAIR  BLEPHAROPTOSIS REPAIR; RESECT EX BILATERAL;  Surgeon: Imagene Riches, MD;  Location: [facility];  Service: Ophthalmology;  Laterality: Bilateral;   LOOP RECORDER REMOVAL N/A 06/06/2023   Procedure: LOOP RECORDER REMOVAL;  Surgeon: Duke Salvia, MD;  Location: [facility] INVASIVE CV LAB;  Service: Cardiovascular;  Laterality: N/A;   PACEMAKER IMPLANT N/A 06/06/2023   Procedure: PACEMAKER IMPLANT;  Surgeon: Duke Salvia, MD;  Location: [facility] INVASIVE CV LAB;   Service: Cardiovascular;  Laterality: N/A;   WRIST SURGERY Right    Patient Active Problem List   Diagnosis Date Noted   Lightheadedness 05/08/2023   Implantable loop recorder present 03/31/2023   NSVT (nonsustained ventricular tachycardia) (HCC) 02/13/2023   SVT (supraventricular tachycardia) - non sustained 02/13/2023   Near syncope 12/13/2022   [name] (HCC) 12/13/2022   Sinus bradycardia 12/13/2022   History of CVA (cerebrovascular accident) 12/13/2022   Hyperlipidemia 12/13/2022   Dyspnea on exertion 12/13/2022    PCP: Louanne Skye, MD  REFERRING PROVIDER: Janice Coffin, PA-C  REFERRING DIAGNOSIS:  G31.83 (ICD-10-CM) - Neurocognitive disorder with Lewy bodies  F02.80 (ICD-10-CM) - Dementia in other diseases classified elsewhere, unspecified severity, without behavioral disturbance, psychotic disturbance, mood disturbance, and anxiety    THERAPY DIAG: Muscle weakness (generalized)  Unsteadiness on feet  Other abnormalities of gait and mobility  Difficulty in walking, not elsewhere classified  ONSET DATE: 05/21/24  FOLLOW UP APPT WITH PROVIDER: Pt is unsure when he has appointment; he states he does have f/u     Pertinent History: Pt is a 78 year old male with Hx of Lewy Body Demantia and Parkinsonism. Past  medical history is significant for [name], TIA, CVA in 2013, asymmetrical hearing loss, dypsnea on exertion, anxiety, HLD, asthma. Pt wants to walk up to 5 miles per day; he walks up to 2-3 mi presently (pt walks around neighborhood streets mostly). Patient reports he did use walking stick, but using it has irritated his back (known arthritis per patient). Patient has diplopia and reports blurry vision at a distance. Pt likes to walk in woods and he feels he does okay with stairs. Janice Coffin, PA-C referred pt to [facility] PT for LSVT BIG intervention.    Pain: Yes; comorbid back pain/OA Numbness/Tingling: No Focal Weakness: No, not focally; he  states he doesn't get out of chair as easily as he used to.  Recent changes in overall health/medication: Yes; steadily worsening [name]  Prior history of physical therapy for balance:  Yes; Pt following CVA 12-13 years ago  Falls: Has patient fallen in last 6 months? Yes, Number of falls: 1 - taking out recyling, descending stairs without rail  carrying recycling into garage Directional pattern for falls: Yes; falling forward when going down stairs and when he tripped one year ago   Imaging: Yes ;  CLINICAL DATA:  Transient ischemic attack (TIA). Neuro deficit, acute, stroke suspected.   EXAM: MRI HEAD WITHOUT CONTRAST   TECHNIQUE: Multiplanar, multiecho pulse sequences of the brain and surrounding structures were obtained without intravenous contrast.   COMPARISON:  Head CT 12/13/2022 and MRI 02/02/2022   FINDINGS: Brain: There is no evidence of an acute infarct, mass, midline shift, or extra-axial fluid collection. Small T2 hyperintensities in the cerebral white matter bilaterally are unchanged from the prior MRI and are nonspecific but compatible with mild chronic small vessel ischemic disease. Chronic medial left cerebellar infarcts are unchanged from the prior MRI, with associated hemosiderin deposition again noted. Mild cerebral atrophy is within normal limits for age.   Vascular: Unchanged abnormal appearance of the distal left vertebral artery likely reflecting chronic occlusion. Other major intracranial vascular flow voids are preserved.   Skull and upper cervical spine: Unremarkable bone marrow signal.   Sinuses/Orbits: Unremarkable orbits. Paranasal sinuses and mastoid air cells are clear.   Other: None.   IMPRESSION: 1. No acute intracranial abnormality. 2. Mild chronic small vessel ischemic disease. 3. Chronic left cerebellar infarcts.    Prior level of function: Independent Occupational demands: Retired Presenter, broadcasting: Walking routine; pt used to  enjoy fishing   Red flags (bowel/bladder changes, saddle paresthesia, personal history of cancer, h/o spinal tumors, h/o compression fx, h/o abdominal aneurysm, abdominal pain, chills/fever, night sweats, nausea, vomiting, unrelenting pain): Negative  Precautions: Fall history, fall risk; pt has pacemaker  Weight Bearing Restrictions: No  Living Environment Lives with: lives with their spouse Lives in: House/apartment; 4-5 steps with rail on R side going up.  lives in house 4-5 STE with rail remodeled bathroom with seat available (does not use) 1 level with bonus room - 12 steps with rail (where his bedroom is located) grandchildren local- enjoys playing "horse" basketball with them    Patient Goals: Maintain his level of function/independence      OBJECTIVE (data from initial evaluation unless otherwise dated):    Patient Surveys  FOTO: 4 (risk-adjusted 55), predicted improvement to 53   Cognition Patient is oriented to person, place, and time.  Recent memory is intact.  Remote memory is moderately impaired.  Attention span and concentration are intact.  Expressive speech is intact, hypophonic.  Patient's fund of knowledge  is within normal limits for educational level.     Gross Musculoskeletal Assessment/Observation Tremor: No resting tremor/intention tremor Bulk: Normal Tone: Hypotonic  Mask face, hypophonia.    GAIT: Distance walked: 160 ft  Assistive device utilized: None Level of assistance: SBA Comments: Good heel strike and velocity/cadence. Absent arm swing and rigid trunk.    Posture: Rounded shoulder, forward head, trunk remains slightly flexed in sitting/standing    LE MMT:  MMT (out of 5) Right 11/22/2023 Left 11/22/2023  Hip flexion 5 5  Hip extension    Hip abduction (seated) 5 5  Hip adduction (seated) 5 5  Hip internal rotation    Hip external rotation    Knee flexion 5 5  Knee extension 5 5  Ankle dorsiflexion 5 5  Ankle  plantarflexion    Ankle inversion    Ankle eversion    (* = pain; Blank rows = not tested)   Sensation Grossly intact to light touch bilateral LEs as determined by testing dermatomes L2-S2. Proprioception, and hot/cold testing deferred on this date.   Reflexes Deferred   Cranial Nerves Visual acuity and visual fields are intact  Extraocular muscles are intact  -pt exhibits convergence insufficiency  Facial sensation is intact bilaterally  Facial strength is intact bilaterally  Hearing loss is mild as tested by gross conversation Palate elevates midline, normal phonation  Shoulder shrug strength is intact  Tongue protrudes slightly L    Coordination/Cerebellar (updated 11/23/23) Finger to Nose: WNL Heel to Shin: WNL Rapid alternating movements: UE impaired, LE WNL Finger Opposition: WNL Pronator Drift: Positive on L upper limb   FUNCTIONAL OUTCOME MEASURES (baseline measure updated 11/23/23)   Results Comments  BERG 11/23/23: 53/56 Above fall risk cut-off score  FGA 11/23/23: 27/30 Above fall risk cut-off score  Mini-BESTest 11/23/23: 24/28 Above fall risk cut-off score  TUG 11/22/23: 8.3 seconds Well under cut-off score for various populations  5TSTS 11/22/23: 9.5 seconds Slight backward LOB during 4th rep, pt below cut-off time   (Blank rows = not tested)   FUNCTIONAL TASKS  Stairs: pt demonstrates reciprocal stair ascent with no handrail support, no loss of balance and sound toe clearance with each successive step Sit to stand: pt performs readily with no upper limb support, intermittently pushes posterior thighs on chair to assist and has one incident of mild posterior LOB upon attaining standing position     TODAY'S TREATMENT  12/19/23   Subjective: Patient with no new complaints this date. Reports he had to take his wife to the ER over the weekend for her heart.    Nustep level 3 x 8 minutes for cardiorespiratory endurance and BLE strengthening Ambulation in  hallway with 4# AW with dual task ball on cone (ice cream cone carry) x 6 laps  Sit to stand 2 x 10 with feet on airex and 8# med ball overhead press  Obstacle course in hallway fwd/lateral (half bolster, 6" step, 12" hurdle, 6" hurdle, airex, alternating cone taps) x 3 laps each direction  Standing on firm surface with 4# med ball toss on trampoline x 1 minute, standing on foam 2 x 1 minute    PATIENT EDUCATION:  Education details: see above for patient education details Person educated: Patient Education method: Explanation Education comprehension: verbalized understanding   HOME EXERCISE PROGRAM: *Formal HEP to be updated with subsequent PT visits  *Pt has the following home exercises from [place] following 10/30/23 PT assessment at [facility] center:  1) step and reach forward -  hands wide 10x 2) step and reach lateral- hands wide x 10 3) sit->stand hands wide- 10x 4) SLS at chair- 5x/10 sec consistently    ASSESSMENT:  CLINICAL IMPRESSION:     Patient arrives to treatment session motivated to participate. Good tolerance to dual tasking and higher level balance activities. Patient wanting to get started adding walking in the woods back into his walking regimen. Patient will continue to benefit from skilled therapy to address remaining deficits in order to improve quality of life and return to PLOF.    REHAB POTENTIAL: Good  CLINICAL DECISION MAKING: Unstable/unpredictable  EVALUATION COMPLEXITY: High   GOALS: Goals reviewed with patient? Yes  SHORT TERM GOALS: Target date: 12/14/2023  Pt will be independent with HEP in order to improve strength and balance in order to decrease fall risk and improve function at home. Baseline: 11/22/23: Formal HEP to be initiated at second follow-up visit.  Goal status: INITIAL   LONG TERM GOALS: Target date: 01/18/2024  Pt will increase FOTO to at least 77 to demonstrate significant improvement in function at home related to balance  Baseline:  11/22/23: 78 (risk-adjusted FOTO 55) Goal status: INITIAL  2.  Pt will improve BERG by at least 3 points in order to demonstrate clinically significant improvement in balance.   Baseline: 11/22/23: To be completed next visit.    11/23/23: 53/56 Goal status: INITIAL  3.  Pt will improve Mini-BESTest by at least 4 points in order to demonstrate clinically significant improvement in balance and decreased risk for falls    Baseline: 11/22/23: To be completed next visit.    11/23/23: 24/28 Goal status: INITIAL   4.  Pt will improve FGA by at least 5 points in order to demonstrate clinically significant improvement in balance and decreased risk for falls       Baseline: 11/22/23: To be completed next visit.    11/23/23: 27/30 Goal status: INITIAL    PLAN: PT FREQUENCY: 2x/week  PT DURATION: 6-8 weeks  PLANNED INTERVENTIONS: Therapeutic exercises, Therapeutic activity, Neuromuscular re-education, Balance training, Gait training, Patient/Family education, Joint manipulation, Joint mobilization, Canalith repositioning, Aquatic Therapy, Dry Needling, Cognitive remediation, Electrical stimulation, Spinal manipulation, Spinal mobilization, Cryotherapy, Moist heat, Traction, Ultrasound, Ionotophoresis 4mg /ml Dexamethasone, and Manual therapy  PLAN FOR NEXT SESSION:  advanced dynamic balance dual tasking   9:43 AM, 12/19/23 Maylon Peppers PT, DPT Physical Therapist - [facility]  [phone] [facility])    9:43 AM,12/19/23

## 2023-12-20 NOTE — Therapy (Incomplete)
OUTPATIENT PHYSICAL THERAPY TREATMENT   Patient Name: Roger Sanchez MRN: [id number] DOB:July 09, 1946, 78 y.o., male Today's Date: 12/20/2023           Past Medical History:  Diagnosis Date   Arthralgia    shoulder    Asthma    Asymmetrical hearing loss    Atypical chest pain    Brachial plexus disorders    Bradycardia    Carpal tunnel syndrome    Cerebral infarction [facility])    Closed fracture of wrist    Exposure to Agent Orange    GERD (gastroesophageal reflux disease)    HOH (hard of hearing)    bilateral   Hyperlipidemia    Hypertrophy of prostate    benign    [name] (HCC)    Low back pain    Lumbar spine pain    compression fracture    Malignant neoplasm (HCC)    skin    Metatarsalgia    Osteopenia    Plantar fibromatosis    Stroke [facility]) 78 yrs old   no residual effects   Transient ischemic attack    Trigger finger    Past Surgical History:  Procedure Laterality Date   BACK SURGERY     BROW LIFT Bilateral 03/26/2021   Procedure: BLEPHAROPLASTY UPPER EYELID; W/EXCESS SKIN BROW PTOSIS REPAIR  BLEPHAROPTOSIS REPAIR; RESECT EX BILATERAL;  Surgeon: Imagene Riches, MD;  Location: [facility];  Service: Ophthalmology;  Laterality: Bilateral;   LOOP RECORDER REMOVAL N/A 06/06/2023   Procedure: LOOP RECORDER REMOVAL;  Surgeon: Duke Salvia, MD;  Location: [facility] INVASIVE CV LAB;  Service: Cardiovascular;  Laterality: N/A;   PACEMAKER IMPLANT N/A 06/06/2023   Procedure: PACEMAKER IMPLANT;  Surgeon: Duke Salvia, MD;  Location: [facility] INVASIVE CV LAB;  Service: Cardiovascular;  Laterality: N/A;   WRIST SURGERY Right    Patient Active Problem List   Diagnosis Date Noted   Lightheadedness 05/08/2023   Implantable loop recorder present 03/31/2023   NSVT (nonsustained ventricular tachycardia) (HCC) 02/13/2023   SVT (supraventricular tachycardia) - non sustained 02/13/2023   Near syncope 12/13/2022   [name] (HCC) 12/13/2022   Sinus  bradycardia 12/13/2022   History of CVA (cerebrovascular accident) 12/13/2022   Hyperlipidemia 12/13/2022   Dyspnea on exertion 12/13/2022    PCP: Louanne Skye, MD  REFERRING PROVIDER: Janice Coffin, PA-C  REFERRING DIAGNOSIS:  G31.83 (ICD-10-CM) - Neurocognitive disorder with Lewy bodies  F02.80 (ICD-10-CM) - Dementia in other diseases classified elsewhere, unspecified severity, without behavioral disturbance, psychotic disturbance, mood disturbance, and anxiety    THERAPY DIAG: Muscle weakness (generalized)  Unsteadiness on feet  Other abnormalities of gait and mobility  Difficulty in walking, not elsewhere classified  ONSET DATE: 05/21/24  FOLLOW UP APPT WITH PROVIDER: Pt is unsure when he has appointment; he states he does have f/u     Pertinent History: Pt is a 78 year old male with Hx of Lewy Body Demantia and Parkinsonism. Past medical history is significant for [name], TIA, CVA in 2013, asymmetrical hearing loss, dypsnea on exertion, anxiety, HLD, asthma. Pt wants to walk up to 5 miles per day; he walks up to 2-3 mi presently (pt walks around neighborhood streets mostly). Patient reports he did use walking stick, but using it has irritated his back (known arthritis per patient). Patient has diplopia and reports blurry vision at a distance. Pt likes to walk in woods and he feels he does okay with stairs. Janice Coffin, PA-C referred pt to  [facility] for LSVT BIG intervention.    Pain: Yes; comorbid back pain/OA Numbness/Tingling: No Focal Weakness: No, not focally; he states he doesn't get out of chair as easily as he used to.  Recent changes in overall health/medication: Yes; steadily worsening [name]  Prior history of physical therapy for balance:  Yes; Pt following CVA 12-13 years ago  Falls: Has patient fallen in last 6 months? Yes, Number of falls: 1 - taking out recyling, descending stairs without rail  carrying recycling into  garage Directional pattern for falls: Yes; falling forward when going down stairs and when he tripped one year ago   Imaging: Yes ;  CLINICAL DATA:  Transient ischemic attack (TIA). Neuro deficit, acute, stroke suspected.   EXAM: MRI HEAD WITHOUT CONTRAST   TECHNIQUE: Multiplanar, multiecho pulse sequences of the brain and surrounding structures were obtained without intravenous contrast.   COMPARISON:  Head CT 12/13/2022 and MRI 02/02/2022   FINDINGS: Brain: There is no evidence of an acute infarct, mass, midline shift, or extra-axial fluid collection. Small T2 hyperintensities in the cerebral white matter bilaterally are unchanged from the prior MRI and are nonspecific but compatible with mild chronic small vessel ischemic disease. Chronic medial left cerebellar infarcts are unchanged from the prior MRI, with associated hemosiderin deposition again noted. Mild cerebral atrophy is within normal limits for age.   Vascular: Unchanged abnormal appearance of the distal left vertebral artery likely reflecting chronic occlusion. Other major intracranial vascular flow voids are preserved.   Skull and upper cervical spine: Unremarkable bone marrow signal.   Sinuses/Orbits: Unremarkable orbits. Paranasal sinuses and mastoid air cells are clear.   Other: None.   IMPRESSION: 1. No acute intracranial abnormality. 2. Mild chronic small vessel ischemic disease. 3. Chronic left cerebellar infarcts.    Prior level of function: Independent Occupational demands: Retired Presenter, broadcasting: Walking routine; pt used to enjoy fishing   Red flags (bowel/bladder changes, saddle paresthesia, personal history of cancer, h/o spinal tumors, h/o compression fx, h/o abdominal aneurysm, abdominal pain, chills/fever, night sweats, nausea, vomiting, unrelenting pain): Negative  Precautions: Fall history, fall risk; pt has pacemaker  Weight Bearing Restrictions: No  Living Environment Lives with: lives  with their spouse Lives in: House/apartment; 4-5 steps with rail on R side going up.  lives in house 4-5 STE with rail remodeled bathroom with seat available (does not use) 1 level with bonus room - 12 steps with rail (where his bedroom is located) grandchildren local- enjoys playing "horse" basketball with them    Patient Goals: Maintain his level of function/independence      OBJECTIVE (data from initial evaluation unless otherwise dated):    Patient Surveys  FOTO: 24 (risk-adjusted 55), predicted improvement to 2   Cognition Patient is oriented to person, place, and time.  Recent memory is intact.  Remote memory is moderately impaired.  Attention span and concentration are intact.  Expressive speech is intact, hypophonic.  Patient's fund of knowledge is within normal limits for educational level.     Gross Musculoskeletal Assessment/Observation Tremor: No resting tremor/intention tremor Bulk: Normal Tone: Hypotonic  Mask face, hypophonia.    GAIT: Distance walked: 160 ft  Assistive device utilized: None Level of assistance: SBA Comments: Good heel strike and velocity/cadence. Absent arm swing and rigid trunk.    Posture: Rounded shoulder, forward head, trunk remains slightly flexed in sitting/standing    LE MMT:  MMT (out of 5) Right 11/22/2023 Left 11/22/2023  Hip flexion 5 5  Hip extension  Hip abduction (seated) 5 5  Hip adduction (seated) 5 5  Hip internal rotation    Hip external rotation    Knee flexion 5 5  Knee extension 5 5  Ankle dorsiflexion 5 5  Ankle plantarflexion    Ankle inversion    Ankle eversion    (* = pain; Blank rows = not tested)   Sensation Grossly intact to light touch bilateral LEs as determined by testing dermatomes L2-S2. Proprioception, and hot/cold testing deferred on this date.   Reflexes Deferred   Cranial Nerves Visual acuity and visual fields are intact  Extraocular muscles are intact  -pt exhibits  convergence insufficiency  Facial sensation is intact bilaterally  Facial strength is intact bilaterally  Hearing loss is mild as tested by gross conversation Palate elevates midline, normal phonation  Shoulder shrug strength is intact  Tongue protrudes slightly L    Coordination/Cerebellar (updated 11/23/23) Finger to Nose: WNL Heel to Shin: WNL Rapid alternating movements: UE impaired, LE WNL Finger Opposition: WNL Pronator Drift: Positive on L upper limb   FUNCTIONAL OUTCOME MEASURES (baseline measure updated 11/23/23)   Results Comments  BERG 11/23/23: 53/56 Above fall risk cut-off score  FGA 11/23/23: 27/30 Above fall risk cut-off score  Mini-BESTest 11/23/23: 24/28 Above fall risk cut-off score  TUG 11/22/23: 8.3 seconds Well under cut-off score for various populations  5TSTS 11/22/23: 9.5 seconds Slight backward LOB during 4th rep, pt below cut-off time   (Blank rows = not tested)   FUNCTIONAL TASKS  Stairs: pt demonstrates reciprocal stair ascent with no handrail support, no loss of balance and sound toe clearance with each successive step Sit to stand: pt performs readily with no upper limb support, intermittently pushes posterior thighs on chair to assist and has one incident of mild posterior LOB upon attaining standing position     TODAY'S TREATMENT  12/20/23  ***  Subjective: Patient with no new complaints this date. Reports he had to take his wife to the ER over the weekend for her heart.    Nustep level 3 x 8 minutes for cardiorespiratory endurance and BLE strengthening Ambulation in hallway with 4# AW with dual task ball on cone (ice cream cone carry) x 6 laps  Sit to stand 2 x 10 with feet on airex and 8# med ball overhead press  Obstacle course in hallway fwd/lateral (half bolster, 6" step, 12" hurdle, 6" hurdle, airex, alternating cone taps) x 3 laps each direction  Standing on firm surface with 4# med ball toss on trampoline x 1 minute, standing on foam 2 x 1  minute    PATIENT EDUCATION:  Education details: see above for patient education details Person educated: Patient Education method: Explanation Education comprehension: verbalized understanding   HOME EXERCISE PROGRAM: *Formal HEP to be updated with subsequent PT visits  *Pt has the following home exercises from [place] following 10/30/23 PT assessment at [facility] center:  1) step and reach forward - hands wide 10x 2) step and reach lateral- hands wide x 10 3) sit->stand hands wide- 10x 4) SLS at chair- 5x/10 sec consistently    ASSESSMENT:  CLINICAL IMPRESSION:    ***  Patient arrives to treatment session motivated to participate. Good tolerance to dual tasking and higher level balance activities. Patient wanting to get started adding walking in the woods back into his walking regimen. Patient will continue to benefit from skilled therapy to address remaining deficits in order to improve quality of life and return to PLOF.  REHAB POTENTIAL: Good  CLINICAL DECISION MAKING: Unstable/unpredictable  EVALUATION COMPLEXITY: High   GOALS: Goals reviewed with patient? Yes  SHORT TERM GOALS: Target date: 12/14/2023  Pt will be independent with HEP in order to improve strength and balance in order to decrease fall risk and improve function at home. Baseline: 11/22/23: Formal HEP to be initiated at second follow-up visit.  Goal status: INITIAL   LONG TERM GOALS: Target date: 01/18/2024  Pt will increase FOTO to at least 77 to demonstrate significant improvement in function at home related to balance  Baseline: 11/22/23: 78 (risk-adjusted FOTO 55) Goal status: INITIAL  2.  Pt will improve BERG by at least 3 points in order to demonstrate clinically significant improvement in balance.   Baseline: 11/22/23: To be completed next visit.    11/23/23: 53/56 Goal status: INITIAL  3.  Pt will improve Mini-BESTest by at least 4 points in order to demonstrate clinically significant improvement in  balance and decreased risk for falls    Baseline: 11/22/23: To be completed next visit.    11/23/23: 24/28 Goal status: INITIAL   4.  Pt will improve FGA by at least 5 points in order to demonstrate clinically significant improvement in balance and decreased risk for falls       Baseline: 11/22/23: To be completed next visit.    11/23/23: 27/30 Goal status: INITIAL    PLAN: PT FREQUENCY: 2x/week  PT DURATION: 6-8 weeks  PLANNED INTERVENTIONS: Therapeutic exercises, Therapeutic activity, Neuromuscular re-education, Balance training, Gait training, Patient/Family education, Joint manipulation, Joint mobilization, Canalith repositioning, Aquatic Therapy, Dry Needling, Cognitive remediation, Electrical stimulation, Spinal manipulation, Spinal mobilization, Cryotherapy, Moist heat, Traction, Ultrasound, Ionotophoresis 4mg /ml Dexamethasone, and Manual therapy  PLAN FOR NEXT SESSION:  advanced dynamic balance dual tasking    Maylon Peppers PT, DPT Physical Therapist - [facility]  [phone])    10:09 AM,12/20/23

## 2023-12-21 ENCOUNTER — Ambulatory Visit: Payer: PPO | Admitting: Physical Therapy

## 2023-12-21 ENCOUNTER — Telehealth: Payer: Self-pay

## 2023-12-21 DIAGNOSIS — R2681 Unsteadiness on feet: Secondary | ICD-10-CM

## 2023-12-21 DIAGNOSIS — R2689 Other abnormalities of gait and mobility: Secondary | ICD-10-CM

## 2023-12-21 DIAGNOSIS — R262 Difficulty in walking, not elsewhere classified: Secondary | ICD-10-CM

## 2023-12-21 DIAGNOSIS — M6281 Muscle weakness (generalized): Secondary | ICD-10-CM

## 2023-12-21 NOTE — Telephone Encounter (Signed)
Patient did not show for scheduled appointment this date. Author got in contact with patient via telephone call. Patient reports that he had to take his wife to the doctor and forgot to call to inform us. Informed him of his next appointment scheduled.   Maylon Peppers, PT, DPT Physical Therapist - [place]  [facility]

## 2023-12-25 NOTE — Therapy (Incomplete)
 OUTPATIENT PHYSICAL THERAPY TREATMENT   Patient Name: Roger Sanchez MRN: [id number] DOB:[birth year], 78 y.o., male Today's Date: 12/25/2023           Past Medical History:  Diagnosis Date   Arthralgia    shoulder    Asthma    Asymmetrical hearing loss    Atypical chest pain    Brachial plexus disorders    Bradycardia    Carpal tunnel syndrome    Cerebral infarction [facility])    Closed fracture of wrist    Exposure to Agent Orange    GERD (gastroesophageal reflux disease)    HOH (hard of hearing)    bilateral   Hyperlipidemia    Hypertrophy of prostate    benign    [name] (HCC)    Low back pain    Lumbar spine pain    compression fracture    Malignant neoplasm (HCC)    skin    Metatarsalgia    Osteopenia    Plantar fibromatosis    Stroke [facility]) 78 yrs old   no residual effects   Transient ischemic attack    Trigger finger    Past Surgical History:  Procedure Laterality Date   BACK SURGERY     BROW LIFT Bilateral 03/26/2021   Procedure: BLEPHAROPLASTY UPPER EYELID; W/EXCESS SKIN BROW PTOSIS REPAIR  BLEPHAROPTOSIS REPAIR; RESECT EX BILATERAL;  Surgeon: Imagene Riches, MD;  Location: [facility];  Service: Ophthalmology;  Laterality: Bilateral;   LOOP RECORDER REMOVAL N/A 06/06/2023   Procedure: LOOP RECORDER REMOVAL;  Surgeon: Duke Salvia, MD;  Location[facility] INVASIVE CV LAB;  Service: Cardiovascular;  Laterality: N/A;   PACEMAKER IMPLANT N/A 06/06/2023   Procedure: PACEMAKER IMPLANT;  Surgeon: Duke Salvia, MD;  Location: [facility] INVASIVE CV LAB;  Service: Cardiovascular;  Laterality: N/A;   WRIST SURGERY Right    Patient Active Problem List   Diagnosis Date Noted   Lightheadedness 05/08/2023   Implantable loop recorder present 03/31/2023   NSVT (nonsustained ventricular tachycardia) (HCC) 02/13/2023   SVT (supraventricular tachycardia) - non sustained 02/13/2023   Near syncope 12/13/2022   [name] (HCC) 12/13/2022   Sinus  bradycardia 12/13/2022   History of CVA (cerebrovascular accident) 12/13/2022   Hyperlipidemia 12/13/2022   Dyspnea on exertion 12/13/2022    PCP: Louanne Skye, MD  REFERRING PROVIDER: Janice Coffin, PA-C  REFERRING DIAGNOSIS:  G31.83 (ICD-10-CM) - Neurocognitive disorder with Lewy bodies  F02.80 (ICD-10-CM) - Dementia in other diseases classified elsewhere, unspecified severity, without behavioral disturbance, psychotic disturbance, mood disturbance, and anxiety    THERAPY DIAG: Muscle weakness (generalized)  Unsteadiness on feet  Other abnormalities of gait and mobility  Difficulty in walking, not elsewhere classified  ONSET DATE: 05/21/24  FOLLOW UP APPT WITH PROVIDER: Pt is unsure when he has appointment; he states he does have f/u     Pertinent History: Pt is a 78 year old male with Hx of Lewy Body Demantia and Parkinsonism. Past medical history is significant for [name], TIA, CVA in 2013, asymmetrical hearing loss, dypsnea on exertion, anxiety, HLD, asthma. Pt wants to walk up to 5 miles per day; he walks up to 2-3 mi presently (pt walks around neighborhood streets mostly). Patient reports he did use walking stick, but using it has irritated his back (known arthritis per patient). Patient has diplopia and reports blurry vision at a distance. Pt likes to walk in woods and he feels he does okay with stairs. Janice Coffin, PA-C referred pt to  [facility] for LSVT BIG intervention.    Pain: Yes; comorbid back pain/OA Numbness/Tingling: No Focal Weakness: No, not focally; he states he doesn't get out of chair as easily as he used to.  Recent changes in overall health/medication: Yes; steadily worsening [name]  Prior history of physical therapy for balance:  Yes; Pt following CVA 12-13 years ago  Falls: Has patient fallen in last 6 months? Yes, Number of falls: 1 - taking out recyling, descending stairs without rail  carrying recycling into  garage Directional pattern for falls: Yes; falling forward when going down stairs and when he tripped one year ago   Imaging: Yes ;  CLINICAL DATA:  Transient ischemic attack (TIA). Neuro deficit, acute, stroke suspected.   EXAM: MRI HEAD WITHOUT CONTRAST   TECHNIQUE: Multiplanar, multiecho pulse sequences of the brain and surrounding structures were obtained without intravenous contrast.   COMPARISON:  Head CT 12/13/2022 and MRI 02/02/2022   FINDINGS: Brain: There is no evidence of an acute infarct, mass, midline shift, or extra-axial fluid collection. Small T2 hyperintensities in the cerebral white matter bilaterally are unchanged from the prior MRI and are nonspecific but compatible with mild chronic small vessel ischemic disease. Chronic medial left cerebellar infarcts are unchanged from the prior MRI, with associated hemosiderin deposition again noted. Mild cerebral atrophy is within normal limits for age.   Vascular: Unchanged abnormal appearance of the distal left vertebral artery likely reflecting chronic occlusion. Other major intracranial vascular flow voids are preserved.   Skull and upper cervical spine: Unremarkable bone marrow signal.   Sinuses/Orbits: Unremarkable orbits. Paranasal sinuses and mastoid air cells are clear.   Other: None.   IMPRESSION: 1. No acute intracranial abnormality. 2. Mild chronic small vessel ischemic disease. 3. Chronic left cerebellar infarcts.    Prior level of function: Independent Occupational demands: Retired Presenter, broadcasting: Walking routine; pt used to enjoy fishing   Red flags (bowel/bladder changes, saddle paresthesia, personal history of cancer, h/o spinal tumors, h/o compression fx, h/o abdominal aneurysm, abdominal pain, chills/fever, night sweats, nausea, vomiting, unrelenting pain): Negative  Precautions: Fall history, fall risk; pt has pacemaker  Weight Bearing Restrictions: No  Living Environment Lives with: lives  with their spouse Lives in: House/apartment; 4-5 steps with rail on R side going up.  lives in house 4-5 STE with rail remodeled bathroom with seat available (does not use) 1 level with bonus room - 12 steps with rail (where his bedroom is located) grandchildren local- enjoys playing "horse" basketball with them    Patient Goals: Maintain his level of function/independence      OBJECTIVE (data from initial evaluation unless otherwise dated):    Patient Surveys  FOTO: 23 (risk-adjusted 55), predicted improvement to 27   Cognition Patient is oriented to person, place, and time.  Recent memory is intact.  Remote memory is moderately impaired.  Attention span and concentration are intact.  Expressive speech is intact, hypophonic.  Patient's fund of knowledge is within normal limits for educational level.     Gross Musculoskeletal Assessment/Observation Tremor: No resting tremor/intention tremor Bulk: Normal Tone: Hypotonic  Mask face, hypophonia.    GAIT: Distance walked: 160 ft  Assistive device utilized: None Level of assistance: SBA Comments: Good heel strike and velocity/cadence. Absent arm swing and rigid trunk.    Posture: Rounded shoulder, forward head, trunk remains slightly flexed in sitting/standing    LE MMT:  MMT (out of 5) Right 11/22/2023 Left 11/22/2023  Hip flexion 5 5  Hip extension  Hip abduction (seated) 5 5  Hip adduction (seated) 5 5  Hip internal rotation    Hip external rotation    Knee flexion 5 5  Knee extension 5 5  Ankle dorsiflexion 5 5  Ankle plantarflexion    Ankle inversion    Ankle eversion    (* = pain; Blank rows = not tested)   Sensation Grossly intact to light touch bilateral LEs as determined by testing dermatomes L2-S2. Proprioception, and hot/cold testing deferred on this date.   Reflexes Deferred   Cranial Nerves Visual acuity and visual fields are intact  Extraocular muscles are intact  -pt exhibits  convergence insufficiency  Facial sensation is intact bilaterally  Facial strength is intact bilaterally  Hearing loss is mild as tested by gross conversation Palate elevates midline, normal phonation  Shoulder shrug strength is intact  Tongue protrudes slightly L    Coordination/Cerebellar (updated 11/23/23) Finger to Nose: WNL Heel to Shin: WNL Rapid alternating movements: UE impaired, LE WNL Finger Opposition: WNL Pronator Drift: Positive on L upper limb   FUNCTIONAL OUTCOME MEASURES (baseline measure updated 11/23/23)   Results Comments  BERG 11/23/23: 53/56 Above fall risk cut-off score  FGA 11/23/23: 27/30 Above fall risk cut-off score  Mini-BESTest 11/23/23: 24/28 Above fall risk cut-off score  TUG 11/22/23: 8.3 seconds Well under cut-off score for various populations  5TSTS 11/22/23: 9.5 seconds Slight backward LOB during 4th rep, pt below cut-off time   (Blank rows = not tested)   FUNCTIONAL TASKS  Stairs: pt demonstrates reciprocal stair ascent with no handrail support, no loss of balance and sound toe clearance with each successive step Sit to stand: pt performs readily with no upper limb support, intermittently pushes posterior thighs on chair to assist and has one incident of mild posterior LOB upon attaining standing position     TODAY'S TREATMENT  12/25/23  ***  Subjective: Patient with no new complaints this date. Reports he had to take his wife to the ER over the weekend for her heart.    Nustep level 3 x 8 minutes for cardiorespiratory endurance and BLE strengthening Ambulation in hallway with 4# AW with dual task ball on cone (ice cream cone carry) x 6 laps  Sit to stand 2 x 10 with feet on airex and 8# med ball overhead press  Obstacle course in hallway fwd/lateral (half bolster, 6" step, 12" hurdle, 6" hurdle, airex, alternating cone taps) x 3 laps each direction  Standing on firm surface with 4# med ball toss on trampoline x 1 minute, standing on foam 2 x 1  minute    PATIENT EDUCATION:  Education details: see above for patient education details Person educated: Patient Education method: Explanation Education comprehension: verbalized understanding   HOME EXERCISE PROGRAM: *Formal HEP to be updated with subsequent PT visits  *Pt has the following home exercises from [place] following 10/30/23 PT assessment at [facility] center:  1) step and reach forward - hands wide 10x 2) step and reach lateral- hands wide x 10 3) sit->stand hands wide- 10x 4) SLS at chair- 5x/10 sec consistently    ASSESSMENT:  CLINICAL IMPRESSION:    ***  Patient arrives to treatment session motivated to participate. Good tolerance to dual tasking and higher level balance activities. Patient wanting to get started adding walking in the woods back into his walking regimen. Patient will continue to benefit from skilled therapy to address remaining deficits in order to improve quality of life and return to PLOF.  REHAB POTENTIAL: Good  CLINICAL DECISION MAKING: Unstable/unpredictable  EVALUATION COMPLEXITY: High   GOALS: Goals reviewed with patient? Yes  SHORT TERM GOALS: Target date: 12/14/2023  Pt will be independent with HEP in order to improve strength and balance in order to decrease fall risk and improve function at home. Baseline: 11/22/23: Formal HEP to be initiated at second follow-up visit.  Goal status: INITIAL   LONG TERM GOALS: Target date: 01/18/2024  Pt will increase FOTO to at least 77 to demonstrate significant improvement in function at home related to balance  Baseline: 11/22/23: 78 (risk-adjusted FOTO 55) Goal status: INITIAL  2.  Pt will improve BERG by at least 3 points in order to demonstrate clinically significant improvement in balance.   Baseline: 11/22/23: To be completed next visit.    11/23/23: 53/56 Goal status: INITIAL  3.  Pt will improve Mini-BESTest by at least 4 points in order to demonstrate clinically significant improvement in  balance and decreased risk for falls    Baseline: 11/22/23: To be completed next visit.    11/23/23: 24/28 Goal status: INITIAL   4.  Pt will improve FGA by at least 5 points in order to demonstrate clinically significant improvement in balance and decreased risk for falls       Baseline: 11/22/23: To be completed next visit.    11/23/23: 27/30 Goal status: INITIAL    PLAN: PT FREQUENCY: 2x/week  PT DURATION: 6-8 weeks  PLANNED INTERVENTIONS: Therapeutic exercises, Therapeutic activity, Neuromuscular re-education, Balance training, Gait training, Patient/Family education, Joint manipulation, Joint mobilization, Canalith repositioning, Aquatic Therapy, Dry Needling, Cognitive remediation, Electrical stimulation, Spinal manipulation, Spinal mobilization, Cryotherapy, Moist heat, Traction, Ultrasound, Ionotophoresis 4mg /ml Dexamethasone, and Manual therapy  PLAN FOR NEXT SESSION:  advanced dynamic balance dual tasking    Maylon Peppers PT, DPT Physical Therapist - [facility]    1:59 PM,12/25/23

## 2023-12-26 ENCOUNTER — Ambulatory Visit: Payer: PPO

## 2023-12-26 DIAGNOSIS — M6281 Muscle weakness (generalized): Secondary | ICD-10-CM

## 2023-12-26 DIAGNOSIS — R2681 Unsteadiness on feet: Secondary | ICD-10-CM

## 2023-12-26 DIAGNOSIS — R2689 Other abnormalities of gait and mobility: Secondary | ICD-10-CM

## 2023-12-26 DIAGNOSIS — R262 Difficulty in walking, not elsewhere classified: Secondary | ICD-10-CM

## 2023-12-26 NOTE — Therapy (Signed)
 OUTPATIENT PHYSICAL THERAPY TREATMENT/ REASSESSMENT AND DISCHARGE    Patient Name: MATTHEWJAMES PETRASEK MRN: [id number] DOB:22-Mar-1946, 78 y.o., male Today's Date: 12/26/2023   PT End of Session - 12/26/23 0953     Visit Number 9    Number of Visits 21    Date for PT Re-Evaluation 01/31/24    Authorization Type [employer]    PT Start Time 0858    PT Stop Time 0945    PT Time Calculation (min) 47 min    Equipment Utilized During Treatment Gait belt    Activity Tolerance Patient tolerated treatment well;No increased pain    Behavior During Therapy WFL for tasks assessed/performed                  Past Medical History:  Diagnosis Date   Arthralgia    shoulder    Asthma    Asymmetrical hearing loss    Atypical chest pain    Brachial plexus disorders    Bradycardia    Carpal tunnel syndrome    Cerebral infarction (HCC)    Closed fracture of wrist    Exposure to Agent Orange    GERD (gastroesophageal reflux disease)    HOH (hard of hearing)    bilateral   Hyperlipidemia    Hypertrophy of prostate    benign    [name] (HCC)    Low back pain    Lumbar spine pain    compression fracture    Malignant neoplasm (HCC)    skin    Metatarsalgia    Osteopenia    Plantar fibromatosis    Stroke [facility]) 78 yrs old   no residual effects   Transient ischemic attack    Trigger finger    Past Surgical History:  Procedure Laterality Date   BACK SURGERY     BROW LIFT Bilateral 03/26/2021   Procedure: BLEPHAROPLASTY UPPER EYELID; W/EXCESS SKIN BROW PTOSIS REPAIR  BLEPHAROPTOSIS REPAIR; RESECT EX BILATERAL;  Surgeon: Imagene Riches, MD;  Location: [facility];  Service: Ophthalmology;  Laterality: Bilateral;   LOOP RECORDER REMOVAL N/A 06/06/2023   Procedure: LOOP RECORDER REMOVAL;  Surgeon: Duke Salvia, MD;  Location: [facility] INVASIVE CV LAB;  Service: Cardiovascular;  Laterality: N/A;   PACEMAKER IMPLANT N/A 06/06/2023   Procedure: PACEMAKER IMPLANT;   Surgeon: Duke Salvia, MD;  Location: [facility] INVASIVE CV LAB;  Service: Cardiovascular;  Laterality: N/A;   WRIST SURGERY Right    Patient Active Problem List   Diagnosis Date Noted   Lightheadedness 05/08/2023   Implantable loop recorder present 03/31/2023   NSVT (nonsustained ventricular tachycardia) (HCC) 02/13/2023   SVT (supraventricular tachycardia) - non sustained 02/13/2023   Near syncope 12/13/2022   [name] (HCC) 12/13/2022   Sinus bradycardia 12/13/2022   History of CVA (cerebrovascular accident) 12/13/2022   Hyperlipidemia 12/13/2022   Dyspnea on exertion 12/13/2022    PCP: Louanne Skye, MD  REFERRING PROVIDER: Janice Coffin, PA-C  REFERRING DIAGNOSIS:  G31.83 (ICD-10-CM) - Neurocognitive disorder with Lewy bodies  F02.80 (ICD-10-CM) - Dementia in other diseases classified elsewhere, unspecified severity, without behavioral disturbance, psychotic disturbance, mood disturbance, and anxiety    THERAPY DIAG: Muscle weakness (generalized)  Unsteadiness on feet  Other abnormalities of gait and mobility  Difficulty in walking, not elsewhere classified  ONSET DATE: 05/21/24  FOLLOW UP APPT WITH PROVIDER: Pt is unsure when he has appointment; he states he does have f/u    Subjective:  Pt doing well already walked  this morning. Yesterday he had trouble getting out due to ultility work being performed on his street.   Pertinent History: Pt is a 78 year old male with Hx of Lewy Body Demantia and Parkinsonism. Past medical history is significant for [name], TIA, CVA in 2013, asymmetrical hearing loss, dypsnea on exertion, anxiety, HLD, asthma. Pt wants to walk up to 5 miles per day; he walks up to 2-3 mi presently (pt walks around neighborhood streets mostly). Patient reports he did use walking stick, but using it has irritated his back (known arthritis per patient). Patient has diplopia and reports blurry vision at a distance. Pt likes to walk in  woods and he feels he does okay with stairs. Janice Coffin, PA-C referred pt to [facility] PT for LSVT BIG intervention.    Pain: Yes; comorbid back pain/OA Numbness/Tingling: No Focal Weakness: No, not focally; he states he doesn't get out of chair as easily as he used to.  Recent changes in overall health/medication: Yes; steadily worsening [name]  Prior history of physical therapy for balance:  Yes; Pt following CVA 12-13 years ago  Falls: Has patient fallen in last 6 months? Yes, Number of falls: 1 - taking out recyling, descending stairs without rail  carrying recycling into garage Directional pattern for falls: Yes; falling forward when going down stairs and when he tripped one year ago   Imaging: Yes ;  CLINICAL DATA:  Transient ischemic attack (TIA). Neuro deficit, acute, stroke suspected.   EXAM: MRI HEAD WITHOUT CONTRAST   TECHNIQUE: Multiplanar, multiecho pulse sequences of the brain and surrounding structures were obtained without intravenous contrast.   COMPARISON:  Head CT 12/13/2022 and MRI 02/02/2022   FINDINGS: Brain: There is no evidence of an acute infarct, mass, midline shift, or extra-axial fluid collection. Small T2 hyperintensities in the cerebral white matter bilaterally are unchanged from the prior MRI and are nonspecific but compatible with mild chronic small vessel ischemic disease. Chronic medial left cerebellar infarcts are unchanged from the prior MRI, with associated hemosiderin deposition again noted. Mild cerebral atrophy is within normal limits for age.   Vascular: Unchanged abnormal appearance of the distal left vertebral artery likely reflecting chronic occlusion. Other major intracranial vascular flow voids are preserved.   Skull and upper cervical spine: Unremarkable bone marrow signal.   Sinuses/Orbits: Unremarkable orbits. Paranasal sinuses and mastoid air cells are clear.   Other: None.   IMPRESSION: 1. No acute  intracranial abnormality. 2. Mild chronic small vessel ischemic disease. 3. Chronic left cerebellar infarcts.    Prior level of function: Independent Occupational demands: Retired Presenter, broadcasting: Walking routine; pt used to enjoy fishing   Red flags (bowel/bladder changes, saddle paresthesia, personal history of cancer, h/o spinal tumors, h/o compression fx, h/o abdominal aneurysm, abdominal pain, chills/fever, night sweats, nausea, vomiting, unrelenting pain): Negative  Precautions: Fall history, fall risk; pt has pacemaker  Weight Bearing Restrictions: No  Living Environment Lives with: lives with their spouse Lives in: House/apartment; 4-5 steps with rail on R side going up.  lives in house 4-5 STE with rail remodeled bathroom with seat available (does not use) 1 level with bonus room - 12 steps with rail (where his bedroom is located) grandchildren local- enjoys playing "horse" basketball with them    Patient Goals: Maintain his level of function/independence      OBJECTIVE (data from initial evaluation unless otherwise dated):   Patient Surveys  FOTO: 78 (risk-adjusted 55), predicted improvement to 75  Gross Musculoskeletal Assessment/Observation Tremor: No  resting tremor/intention tremor Bulk: Normal Tone: Hypotonic  Mask face, hypophonia.   LE MMT:  MMT (out of 5) Right 11/22/2023 Left 11/22/2023  Hip flexion 5 5  Hip extension    Hip abduction (seated) 5 5  Hip adduction (seated) 5 5  Hip internal rotation    Hip external rotation    Knee flexion 5 5  Knee extension 5 5  Ankle dorsiflexion 5 5  Ankle plantarflexion    Ankle inversion    Ankle eversion    (* = pain; Blank rows = not tested)   Sensation Grossly intact to light touch bilateral LEs as determined by testing dermatomes L2-S2. Proprioception, and hot/cold testing deferred on this date.    Coordination/Cerebellar (updated 11/23/23) Finger to Nose: WNL Heel to Shin: WNL Rapid alternating  movements: UE impaired, LE WNL Finger Opposition: WNL Pronator Drift: Positive on L upper limb   FUNCTIONAL OUTCOME MEASURES (baseline measure updated 11/23/23)   Results Comments  BERG 11/23/23: 53/56 Above fall risk cut-off score  FGA 11/23/23: 27/30 Above fall risk cut-off score  Mini-BESTest 11/23/23: 24/28 Above fall risk cut-off score  TUG 11/22/23: 8.3 seconds Well under cut-off score for various populations  5TSTS 11/22/23: 9.5 seconds Slight backward LOB during 4th rep, pt below cut-off time   (Blank rows = not tested)   FUNCTIONAL TASKS  Stairs: pt demonstrates reciprocal stair ascent with no handrail support, no loss of balance and sound toe clearance with each successive step Sit to stand: pt performs readily with no upper limb support, intermittently pushes posterior thighs on chair to assist and has one incident of mild posterior LOB upon attaining standing position     TODAY'S TREATMENT     FOTO survery: 84 ABC scale: 77.5%  AMB overground outdoors around [employer]:  -1.10 miles, no device used -up/downb stairs and/or curbsteps  -up/over/down terrain transitions, speed bumps -uphill/down hill grading; lateral grading -soft grassy dirt -golf ball sized river rocks -several mental recall and spacial processing interventions during walking  -physioball self toss/catch 6x25 (no drops, no LOB)   Balance course circle in gym with variable surface types and heights carrying weighted items Ball knee dribblign practice Blue phys ball rebouding off wall x20 Trunk twist with latearl ball rebound, alternating sides in hallway   PATIENT EDUCATION:  Education details: see above for patient education details Person educated: Patient Education method: Explanation Education comprehension: verbalized understanding   HOME EXERCISE PROGRAM: *Formal HEP to be updated with subsequent PT visits  *Pt has the following home exercises from [place] following 10/30/23 PT  assessment at [facility] center:  1) step and reach forward - hands wide 10x 2) step and reach lateral- hands wide x 10 3) sit->stand hands wide- 10x 4) SLS at chair- 5x/10 sec consistently    ASSESSMENT:  CLINICAL IMPRESSION:    Pt continues with diligence on his personal fitness routine which includes home prescribed exercises as well as daily walking with a goal of 5 miles daily.  Pt's confidence is improve significantly as per his ABC Score, as well as FOTO score improvement. Standardized outcome meausres were never able to reveal any significant falls risk due to ceiling effect of tools chosen. Pt reports PT is interfering with his ability to meet his walking goals due to many medical appointments between his schedule and wife's. Pt is safe and appropriate for DC to self management at this point. Today's session included 30 minutes of continuous outdoors walking with dual tasking, cognitive challenges, variable  terrain challenges, stairs, gravel- no LOB seen. Pt to be DC from PT at this time.    REHAB POTENTIAL: Good  CLINICAL DECISION MAKING: Unstable/unpredictable  EVALUATION COMPLEXITY: High   GOALS: Goals reviewed with patient? Yes  SHORT TERM GOALS: Target date: 12/14/2023  Pt will be independent with HEP in order to improve strength and balance in order to decrease fall risk and improve function at home. Baseline: 11/22/23: Formal HEP to be initiated at second follow-up visit; 2/18: IN PLACE AND PERFORMED  Goal status: MET   LONG TERM GOALS: Target date: 01/18/2024  Pt will increase FOTO to at least 77 to demonstrate significant improvement in function at home related to balance  Baseline: 11/22/23: 78; 12/26/23: 84% Goal status: MET  2.  Pt will improve BERG by at least 3 points in order to demonstrate clinically significant improvement in balance.   Baseline: 11/22/23: To be completed next visit.    11/23/23: 53/56; 12/26/23: No retest warranted  Goal status: NO LONGER RELEVANT, PT  ALREADY IN LOW RISK CATEGORY  3.  Pt will improve Mini-BESTest by at least 4 points in order to demonstrate clinically significant improvement in balance and decreased risk for falls    Baseline: 11/22/23: To be completed next visit.    11/23/23: 24/28; 12/26/23: No retest warranted Goal status: NO LONGER RELEVANT, PT ALREADY IN LOW RISK CATEGORY   4.  Pt will improve FGA by at least 5 points in order to demonstrate clinically significant improvement in balance and decreased risk for falls       Baseline: 11/22/23: To be completed next visit.    11/23/23: 27/30 Goal status: NOT RELEVANT  PLAN: PT FREQUENCY: 2x/week  PT DURATION: 6-8 weeks  PLANNED INTERVENTIONS: Therapeutic exercises, Therapeutic activity, Neuromuscular re-education, Balance training, Gait training, Patient/Family education, Joint manipulation, Joint mobilization, Canalith repositioning, Aquatic Therapy, Dry Needling, Cognitive remediation, Electrical stimulation, Spinal manipulation, Spinal mobilization, Cryotherapy, Moist heat, Traction, Ultrasound, Ionotophoresis 4mg /ml Dexamethasone, and Manual therapy    9:55 AM, 12/26/23 Rosamaria Lints, PT, DPT Physical Therapist - [facility]  [phone] [facility])    9:55 AM,12/26/23

## 2023-12-28 ENCOUNTER — Ambulatory Visit: Payer: PPO | Admitting: Physical Therapy

## 2024-01-02 ENCOUNTER — Encounter: Payer: No Typology Code available for payment source | Admitting: Physical Therapy

## 2024-01-04 ENCOUNTER — Encounter: Payer: No Typology Code available for payment source | Admitting: Physical Therapy

## 2024-01-08 ENCOUNTER — Encounter: Payer: Self-pay | Admitting: Internal Medicine

## 2024-01-09 ENCOUNTER — Encounter: Payer: No Typology Code available for payment source | Admitting: Physical Therapy

## 2024-01-11 ENCOUNTER — Encounter: Payer: No Typology Code available for payment source | Admitting: Physical Therapy

## 2024-01-15 NOTE — Progress Notes (Signed)
 Remote pacemaker transmission.

## 2024-03-05 ENCOUNTER — Ambulatory Visit (INDEPENDENT_AMBULATORY_CARE_PROVIDER_SITE_OTHER): Payer: No Typology Code available for payment source

## 2024-03-05 DIAGNOSIS — R001 Bradycardia, unspecified: Secondary | ICD-10-CM

## 2024-03-05 LAB — CUP PACEART REMOTE DEVICE CHECK
Battery Remaining Longevity: 155 mo
Battery Voltage: 3.06 V
Brady Statistic AP VP Percent: 0.31 %
Brady Statistic AP VS Percent: 85.88 %
Brady Statistic AS VP Percent: 0 %
Brady Statistic AS VS Percent: 13.81 %
Brady Statistic RA Percent Paced: 86.71 %
Brady Statistic RV Percent Paced: 0.31 %
Date Time Interrogation Session: 20250428184729
Implantable Lead Connection Status: 753985
Implantable Lead Connection Status: 753985
Implantable Lead Implant Date: 20240730
Implantable Lead Implant Date: 20240730
Implantable Lead Location: 753859
Implantable Lead Location: 753860
Implantable Lead Model: 5076
Implantable Lead Model: 5076
Implantable Pulse Generator Implant Date: 20240730
Lead Channel Impedance Value: 323 Ohm
Lead Channel Impedance Value: 380 Ohm
Lead Channel Impedance Value: 437 Ohm
Lead Channel Impedance Value: 494 Ohm
Lead Channel Pacing Threshold Amplitude: 0.5 V
Lead Channel Pacing Threshold Amplitude: 0.875 V
Lead Channel Pacing Threshold Pulse Width: 0.4 ms
Lead Channel Pacing Threshold Pulse Width: 0.4 ms
Lead Channel Sensing Intrinsic Amplitude: 1.5 mV
Lead Channel Sensing Intrinsic Amplitude: 1.5 mV
Lead Channel Sensing Intrinsic Amplitude: 11.875 mV
Lead Channel Sensing Intrinsic Amplitude: 11.875 mV
Lead Channel Setting Pacing Amplitude: 1.75 V
Lead Channel Setting Pacing Amplitude: 2 V
Lead Channel Setting Pacing Pulse Width: 0.4 ms
Lead Channel Setting Sensing Sensitivity: 1.2 mV
Zone Setting Status: 755011

## 2024-03-08 ENCOUNTER — Encounter: Payer: Self-pay | Admitting: Cardiology

## 2024-03-13 ENCOUNTER — Telehealth: Payer: Self-pay | Admitting: Emergency Medicine

## 2024-03-13 NOTE — Telephone Encounter (Signed)
 Received the following message from scheduling.   Appointment Request From: Roger Sanchez   With Provider: Antionette Kirks [facility]]   Preferred Date Range: Any   Preferred Times: Any Time   Reason for visit: Office Visit   Health Maintenance Topic:    Comments: Thought I had a follow up. I've had drops in blood pressure and light headiness   Called patient and left message for call back.

## 2024-03-14 ENCOUNTER — Other Ambulatory Visit: Payer: Self-pay | Admitting: Internal Medicine

## 2024-03-15 NOTE — Telephone Encounter (Signed)
 Called patient back.   He states that he has had a low blood pressure readings, and felt light headed at times. Patient states blood pressure was 90's/60's yesterday, this was the low reading. He did not have other log of blood pressure numbers, and did not provide BP/HR readings as he was not able to do this. Patient has not checked his blood pressure today.   Advised patient to continue to monitor blood pressure and heart rate at home- recommended he keep a blood pressure log and call us  back  next week with an update on those readings. He will stay hydrated (as he was outside walking at the time of this call) and eat well.   Patient does continue his Flecainide  tablet as prescribed on his medication list. (Patient is a patient of Dr.Klein)   Will route to team to make aware.   Does have follow up with RD on 05/20.

## 2024-03-18 ENCOUNTER — Ambulatory Visit: Attending: Physician Assistant | Admitting: Physician Assistant

## 2024-03-18 ENCOUNTER — Encounter: Payer: Self-pay | Admitting: Physician Assistant

## 2024-03-18 VITALS — BP 103/67 | HR 61 | Ht 66.0 in | Wt 168.8 lb

## 2024-03-18 DIAGNOSIS — I2510 Atherosclerotic heart disease of native coronary artery without angina pectoris: Secondary | ICD-10-CM | POA: Diagnosis not present

## 2024-03-18 DIAGNOSIS — I491 Atrial premature depolarization: Secondary | ICD-10-CM

## 2024-03-18 DIAGNOSIS — E785 Hyperlipidemia, unspecified: Secondary | ICD-10-CM

## 2024-03-18 DIAGNOSIS — R42 Dizziness and giddiness: Secondary | ICD-10-CM | POA: Diagnosis not present

## 2024-03-18 DIAGNOSIS — R001 Bradycardia, unspecified: Secondary | ICD-10-CM

## 2024-03-18 DIAGNOSIS — R55 Syncope and collapse: Secondary | ICD-10-CM | POA: Diagnosis not present

## 2024-03-18 DIAGNOSIS — Z8673 Personal history of transient ischemic attack (TIA), and cerebral infarction without residual deficits: Secondary | ICD-10-CM

## 2024-03-18 DIAGNOSIS — I350 Nonrheumatic aortic (valve) stenosis: Secondary | ICD-10-CM

## 2024-03-18 DIAGNOSIS — I493 Ventricular premature depolarization: Secondary | ICD-10-CM

## 2024-03-18 NOTE — Patient Instructions (Addendum)
 Medication Instructions:  Your Physician recommend you continue on your current medication as directed.    *If you need a refill on your cardiac medications before your next appointment, please call your pharmacy*  Testing/Procedures: Your physician has requested that you have an echocardiogram. Echocardiography is a painless test that uses sound waves to create images of your heart. It provides your doctor with information about the size and shape of your heart and how well your heart's chambers and valves are working.   You may receive an ultrasound enhancing agent through an IV if needed to better visualize your heart during the echo. This procedure takes approximately one hour.  There are no restrictions for this procedure.  This will take place at [address] [facility] [address], [place] [address]  Please note: We ask at that you not bring children with you during ultrasound (echo/ vascular) testing. Due to room size and safety concerns, children are not allowed in the ultrasound rooms during exams. Our front office staff cannot provide observation of children in our lobby area while testing is being conducted. An adult accompanying a patient to their appointment will only be allowed in the ultrasound room at the discretion of the ultrasound technician under special circumstances. We apologize for any inconvenience.   Follow-Up: At [facility], you and your health needs are our priority.  As part of our continuing mission to provide you with exceptional heart care, our providers are all part of one team.  This team includes your primary Cardiologist (physician) and Advanced Practice Providers or APPs (Physician Assistants and Nurse Practitioners) who all work together to provide you with the care you need, when you need it.  Your next appointment:   3 month(s)  Provider:   Suzann Riddle, NP    We recommend signing up for the patient portal called "MyChart".   Sign up information is provided on this After Visit Summary.  MyChart is used to connect with patients for Virtual Visits (Telemedicine).  Patients are able to view lab/test results, encounter notes, upcoming appointments, etc.  Non-urgent messages can be sent to your provider as well.   To learn more about what you can do with MyChart, go to [email or web address].

## 2024-03-18 NOTE — Progress Notes (Signed)
 Cardiology Office Note    Date:  03/18/2024   ID:  Roger Sanchez, Roger Sanchez [birth year], MRN [id number]  PCP:  Carolanne Church, MD  Cardiologist:  None  Electrophysiologist:  Richardo Chandler, MD   Chief Complaint: Lightheadedness  History of Present Illness:   DEMETRY Sanchez is a 78 y.o. male with history of nonobstructive CAD by coronary CTA in 02/2023, symptomatic bradycardia status post dual-chamber PPM in 05/2023, [name], cryptogenic CVA, mild aortic stenosis, PSVT, NSVT, HLD, and GERD who presents for evaluation of an episode of lightheadedness with positional change this past week.  Remote echo from 12/2018 showed an EF of 55 to 60%, diastolic dysfunction, normal RV systolic function and ventricular cavity size, mildly elevated RVSP estimated at 42.7 mmHg, and severe aortic valve calcification with mild aortic stenosis with a mean gradient of 11 mmHg.  He was hospitalized in 12/2022 with near syncope.  Echo showed an EF of 60 to 65%, no regional wall motion abnormalities, normal LV diastolic function parameters, normal RV systolic function and ventricular cavity size, mildly elevated RVSP estimated at 36.1 mmHg, mildly dilated left atrium, moderate tricuspid regurgitation, mild calcification of the aortic valve with mild stenosis with a mean gradient of 15 mmHg and a valve area 1.59 cm, and an estimated right atrial pressure of 8 mmHg.  Lexiscan MPI was low risk with no evidence of ischemia with coronary artery calcification noted in the LAD, LCx, and RCA.  CT of the head and MRI of the brain were nonacute.  He was bradycardic throughout the admission.  Subsequent outpatient Zio patch showed predominant rhythm of sinus with 1 episode of NSVT lasting 6 beats, 15 episodes of SVT lasting 26.3 seconds, 14 pauses with the longest lasting 5.7 seconds, secondary AV block type I, and occasional PVCs representing a 1.2% burden.  In this setting he was evaluated by EP with multiple different  types of episodes of lightheadedness and near syncope with some episodes felt to be neurally mediated and others concerning for arrhythmic etiology.  Loop recorder was recommended.  Subsequent coronary CTA in 02/2023 showed a calcium  score of 754 which was the 73rd percentile with 25 to 49% LAD and LCx stenosis as well as less than 25% proximal LCx stenosis.  He has been maintained on flecainide  for PACs and PVCs.  Loop recorder subsequently showed sinus pauses with chronotropic incompetence with associated presyncope.  In this setting he underwent PPM implantation in 05/2023.  Following this he did note an improvement in functional status though continued to have orthostatic dizziness.  He was most recently seen by EP in 08/2023 with improved lightheadedness with events suggestive of neurally mediated and driven process.  Most recent pacemaker interrogation from 02/2024 showed A-paced, V-sensed rhythm with normal device function.  Patient contacted our office on 03/13/2024 reporting lightheadedness and dizziness with blood pressure in the 90s over 60s.  Adequate hydration and oral intake was recommended.  He comes in accompanied by his wife today and is without symptoms of angina or cardiac decompensation.  He reports an episode of positional dizziness and lightheadedness with possible presyncope after standing from a seated position in the kitchen after a nap.  There was no frank syncope, chest pain, dyspnea, or palpitations associated with this event.  Blood pressure was obtained and found to be in the 90s over 60s.  After eating a sandwich and drinking Gatorade he felt much better.  Since then his family has been focusing on adequate hydration and  the patient has been asymptomatic.  Blood pressure in the office today is consistent with baseline.  No significant lower extremity swelling, abdominal distention, or early satiety.  No orthopnea.  Around this time he did have a mechanical fall while trying to put on  his jeans.  He did not hit his head or suffer LOC.  No hematochezia or melena.   Labs independently reviewed: 09/2023 - Hgb 13.7, PLT 140, potassium 4.3, BUN 16, serum creatinine 0.8, albumin 4.6, AST/ALT normal, TSH normal 12/2022 - TC 147, TG 50, HDL 61, LDL 76, direct LDL 75  Past Medical History:  Diagnosis Date   Arthralgia    shoulder    Asthma    Asymmetrical hearing loss    Atypical chest pain    Brachial plexus disorders    Bradycardia    Carpal tunnel syndrome    Cerebral infarction (HCC)    Closed fracture of wrist    Exposure to Agent Orange    GERD (gastroesophageal reflux disease)    HOH (hard of hearing)    bilateral   Hyperlipidemia    Hypertrophy of prostate    benign    [name] (HCC)    Low back pain    Lumbar spine pain    compression fracture    Malignant neoplasm (HCC)    skin    Metatarsalgia    Osteopenia    Plantar fibromatosis    Stroke [facility]) 78 yrs old   no residual effects   Transient ischemic attack    Trigger finger     Past Surgical History:  Procedure Laterality Date   BACK SURGERY     BROW LIFT Bilateral 03/26/2021   Procedure: BLEPHAROPLASTY UPPER EYELID; W/EXCESS SKIN BROW PTOSIS REPAIR  BLEPHAROPTOSIS REPAIR; RESECT EX BILATERAL;  Surgeon: Zacarias Hermann, MD;  Location: [facility];  Service: Ophthalmology;  Laterality: Bilateral;   LOOP RECORDER REMOVAL N/A 06/06/2023   Procedure: LOOP RECORDER REMOVAL;  Surgeon: Verona Goodwill, MD;  Location: [facility] CV LAB;  Service: Cardiovascular;  Laterality: N/A;   PACEMAKER IMPLANT N/A 06/06/2023   Procedure: PACEMAKER IMPLANT;  Surgeon: Verona Goodwill, MD;  Location: [facility] INVASIVE CV LAB;  Service: Cardiovascular;  Laterality: N/A;   WRIST SURGERY Right     Current Medications: Current Meds  Medication Sig   Albuterol  Sulfate 108 (90 Base) MCG/ACT AEPB Inhale 2 puffs into the lungs QID.   ascorbic acid  (VITAMIN C ) 500 MG tablet Take 500 mg by mouth daily.    aspirin  EC 81 MG tablet Take 81 mg by mouth daily.   atorvastatin  (LIPITOR) 20 MG tablet Take 10 mg by mouth at bedtime.   clopidogrel  (PLAVIX ) 75 MG tablet Take 75 mg by mouth daily.   clotrimazole (LOTRIMIN) 1 % cream Apply topically. Apply small amount to toe nails at bedtime to treat toenail fungus   famotidine  (PEPCID ) 20 MG tablet Take 20 mg by mouth 2 (two) times daily.   flecainide  (TAMBOCOR ) 50 MG tablet Take 0.5 tablets (25 mg total) by mouth 2 (two) times daily.   glycopyrrolate  (ROBINUL ) 2 MG tablet Take by mouth.   GUAIFENESIN CR PO Take by mouth. prn   lidocaine  (XYLOCAINE ) 5 % ointment Apply topically.   Multiple Vitamins-Minerals (MULTI-VITAMIN/MINERALS PO) Take by mouth.   naproxen (NAPROSYN) 500 MG tablet Take 500 mg by mouth 2 (two) times daily with a meal.   pantoprazole  (PROTONIX ) 40 MG tablet Take 40 mg by mouth 2 (two) times daily.  tamsulosin  (FLOMAX ) 0.4 MG CAPS capsule Take 0.4 mg by mouth.    Allergies:   Barley grass, Corn oil, Corn-containing products, Malt, Milk (cow), Milk-related compounds, Peanut-containing drug products, Simvastatin, and Wheat   Social History   Socioeconomic History   Marital status: Married    Spouse name: Not on file   Number of children: Not on file   Years of education: Not on file   Highest education level: Not on file  Occupational History   Not on file  Tobacco Use   Smoking status: Never    Passive exposure: Never   Smokeless tobacco: Never  Vaping Use   Vaping status: Never Used  Substance and Sexual Activity   Alcohol use: Never   Drug use: Never   Sexual activity: Not Currently  Other Topics Concern   Not on file  Social History Narrative   Not on file   Social Drivers of Health   Financial Resource Strain: Low Risk  (02/15/2024)   [facility]   Overall Financial Resource Strain (CARDIA)    Difficulty of Paying Living Expenses: Not hard at all  Food Insecurity: No Food  Insecurity (02/15/2024)   Received from [facility]   Hunger Vital Sign    Worried About Running Out of Food in the Last Year: Never true    Ran Out of Food in the Last Year: Never true  Transportation Needs: No Transportation Needs (02/15/2024)   Received from [facility]    In the past 12 months, has lack of transportation kept you from medical appointments or from getting medications?: No    Lack of Transportation (Non-Medical): No  Physical Activity: Not on file  Stress: Not on file  Social Connections: Not on file     Family History:  The patient's family history includes Heart Problems in his father; Heart attack in his paternal aunt and paternal uncle.  ROS:   12-point review of systems is negative unless otherwise noted in the HPI.   EKGs/Labs/Other Studies Reviewed:    Studies reviewed were summarized above. The additional studies were reviewed today:  Coronary CTA 02/13/2023: FINDINGS: Aorta: Normal size. Mild ascending aorta calcifications. No dissection.   Aortic Valve:  Trileaflet. mild calcifications.   Coronary Arteries:  Normal coronary origin.  Right dominance.   RCA is a dominant artery. There is calcified plaque in the proximal RCA causing minimal stenosis (<25%).   Left main gives rise to LAD and LCX arteries. LM has no disease.   LAD has calcified plaque proximally causing mild stenosis (25-49%).   LCX is a non-dominant artery. There is calcified plaque proximally causing mild stenosis (25-49%).   Other findings:   Normal pulmonary vein drainage into the left atrium.   Normal left atrial appendage without a thrombus.   Normal size of the pulmonary artery.   IMPRESSION: 1. Coronary calcium  score of 754. This was 73rd percentile for age and sex matched control.   2. Normal coronary origin with right dominance.   3. Mild proximal LAD and LCx stenosis (25-49%).   4. Minimal  proximal LCx stenosis (<25%).   5. CAD-RADS 2. Mild non-obstructive CAD (25-49%). Consider preventive therapy and risk factor modification. __________  Zio patch 12/2022: HR 30 - 162 bpm, average 51 bpm. 1 nonsustained VT lasting 6 beats. 15 nonsustained SVT, longest 26.3 seconds with an average rate of 90 bpm. 14 pauses, longest 5.7 seconds Wenckebach conduction  present during the monitoring period. Rare supraventricular ectopy. Occasional ventricular ectopy, 1.2%. __________  Sal Crass MPI 12/14/2022:   The study is low risk.   No ST deviation was noted.   LV perfusion is normal. There is no evidence of ischemia.   Left ventricular function is normal visually. Calculated LVEF is 47%. correlation with echo advised.   Coronary calcifications noted in the LAD, LCx and RCA. __________  2D echo 12/13/2022: 1. Left ventricular ejection fraction, by estimation, is 60 to 65%. The  left ventricle has normal function. The left ventricle has no regional  wall motion abnormalities. Left ventricular diastolic parameters were  normal.   2. Right ventricular systolic function is normal. The right ventricular  size is normal. There is mildly elevated pulmonary artery systolic  pressure.   3. Left atrial size was mildly dilated.   4. The mitral valve is normal in structure. No evidence of mitral valve  regurgitation. No evidence of mitral stenosis.   5. Tricuspid valve regurgitation is moderate.   6. The aortic valve is tricuspid. There is mild calcification of the  aortic valve. There is moderate thickening of the aortic valve. Aortic  valve regurgitation is not visualized. Mild aortic valve stenosis. Aortic  valve area, by VTI measures 1.59 cm.  Aortic valve mean gradient measures 15.0 mmHg.   7. The inferior vena cava is dilated in size with >50% respiratory  variability, suggesting right atrial pressure of 8 mmHg.  __________  2D echo 12/28/2018: 1. The left ventricle has normal systolic  function, with an ejection  fraction of 55-60%. The cavity size was normal. Left ventricular diastolic  Doppler parameters are consistent with impaired relaxation.   2. The right ventricle has normal systolic function. The cavity was  normal. There is no increase in right ventricular wall thickness. Right  ventricular systolic pressure is mildly elevated with an estimated  pressure of 42.7 mmHg.   3. The aortic valve is normal in structure. Severe calcifcation of the  aortic valve. Mild stenosis of the aortic valve. Mean gradient of 11 mm  Hg.    EKG:  EKG is ordered today.  The EKG ordered today demonstrates Atrial paced rhythm with prolonged AV conduction, 61 bpm  Recent Labs: 05/30/2023: BUN 15; Creatinine, Ser 0.73; Hemoglobin 13.1; Platelets 126; Potassium 3.7; Sodium 139  Recent Lipid Panel    Component Value Date/Time   CHOL 147 12/27/2022 1443   TRIG 50 12/27/2022 1443   HDL 61 12/27/2022 1443   CHOLHDL 2.4 12/27/2022 1443   VLDL 10 12/27/2022 1443   LDLCALC 76 12/27/2022 1443   LDLDIRECT 75 12/27/2022 1443    PHYSICAL EXAM:    VS:  BP 103/67   Pulse 61   Ht 5\' 6"  (1.676 m)   Wt 168 lb 12.8 oz (76.6 kg)   SpO2 99%   BMI 27.25 kg/m   BMI: Body mass index is 27.25 kg/m.  Physical Exam Vitals reviewed.  Constitutional:      Appearance: He is well-developed.  HENT:     Head: Normocephalic and atraumatic.  Eyes:     General:        Right eye: No discharge.        Left eye: No discharge.  Cardiovascular:     Rate and Rhythm: Normal rate and regular rhythm.     Pulses:          Posterior tibial pulses are 2+ on the right side and 2+ on the left side.  Heart sounds: S1 normal and S2 normal. Heart sounds not distant. No midsystolic click and no opening snap. Murmur heard.     Systolic murmur is present with a grade of 1/6 at the upper right sternal border.     No friction rub.  Pulmonary:     Effort: Pulmonary effort is normal. No respiratory distress.      Breath sounds: Normal breath sounds. No decreased breath sounds, wheezing, rhonchi or rales.  Chest:     Chest wall: No tenderness.  Musculoskeletal:     Cervical back: Normal range of motion.     Right lower leg: No edema.     Left lower leg: No edema.  Skin:    General: Skin is warm and dry.     Nails: There is no clubbing.  Neurological:     Mental Status: He is alert and oriented to person, place, and time.  Psychiatric:        Speech: Speech normal.        Behavior: Behavior normal.        Thought Content: Thought content normal.        Judgment: Judgment normal.     Wt Readings from Last 3 Encounters:  03/18/24 168 lb 12.8 oz (76.6 kg)  09/07/23 172 lb 3.2 oz (78.1 kg)  07/06/23 174 lb 4 oz (79 kg)     ASSESSMENT & PLAN:   Dizziness with near syncope: Episode appears to be consistent with orthostasis following positional change after napping.  Improved with adequate hydration and oral intake.  Recent device interrogation with normal pacemaker function.  Obtain echo to evaluate for new cardiomyopathy and to help guide therapy.  Otherwise, no plans for further intervention at this time.  Symptomatic bradycardia: Status post dual-chamber PPM in 05/2023.  Followed by EP.  Nonobstructive CAD: No symptoms suggestive of angina.  Remains on aspirin  81 mg and atorvastatin  20 mg.  Aortic stenosis: Mild by echo in 05/2023.  Update echo as outlined above given episode of dizziness with near syncope.  PACs/PVCs/NSVT: Remains on flecainide .  Not currently on beta-blocker or nondihydropyridine calcium  channel blocker given relative hypotension.  Management per EP.  HLD: LDL 75 in 12/2022.  Remains on atorvastatin  20 mg.  History of CVA: No new deficits.  No evidence of A-fib/flutter on cardiac monitoring.  Remains on DAPT with aspirin  81 mg and clopidogrel  75 mg.  [name]: Followed by neurology.  Stable.  Likely contributing to autonomic dysfunction.    Disposition: F/u  with Dr. Alvenia Aus or APP/EP in 3 months.   Medication Adjustments/Labs and Tests Ordered: Current medicines are reviewed at length with the patient today.  Concerns regarding medicines are outlined above. Medication changes, Labs and Tests ordered today are summarized above and listed in the Patient Instructions accessible in Encounters.   Signed, Varney Gentleman, PA-C 03/18/2024 4:58 PM     [facility] [place] [address] [place], [place] [address] [phone]

## 2024-03-26 ENCOUNTER — Ambulatory Visit: Attending: Family Medicine | Admitting: Physical Therapy

## 2024-03-26 ENCOUNTER — Ambulatory Visit: Admitting: Physician Assistant

## 2024-03-26 ENCOUNTER — Encounter: Payer: Self-pay | Admitting: Physical Therapy

## 2024-03-26 DIAGNOSIS — R2689 Other abnormalities of gait and mobility: Secondary | ICD-10-CM | POA: Diagnosis present

## 2024-03-26 DIAGNOSIS — M6281 Muscle weakness (generalized): Secondary | ICD-10-CM | POA: Insufficient documentation

## 2024-03-26 DIAGNOSIS — R2681 Unsteadiness on feet: Secondary | ICD-10-CM | POA: Diagnosis present

## 2024-03-26 DIAGNOSIS — R262 Difficulty in walking, not elsewhere classified: Secondary | ICD-10-CM | POA: Insufficient documentation

## 2024-03-26 NOTE — Therapy (Signed)
 OUTPATIENT PHYSICAL THERAPY BALANCE EVALUATION  Patient Name: Roger Sanchez MRN: [id number] DOB:29-Aug-1946, 78 y.o., male Today's Date: 03/27/2024   PT End of Session - 03/26/24 0813     Visit Number 1    Number of Visits 12    Date for PT Re-Evaluation 05/07/24    PT Start Time 0813    PT Stop Time 0906    PT Time Calculation (min) 53 min    Equipment Utilized During Treatment Gait belt    Activity Tolerance Patient tolerated treatment well    Behavior During Therapy WFL for tasks assessed/performed            Past Medical History:  Diagnosis Date   Arthralgia    shoulder    Asthma    Asymmetrical hearing loss    Atypical chest pain    Brachial plexus disorders    Bradycardia    Carpal tunnel syndrome    Cerebral infarction (HCC)    Closed fracture of wrist    Exposure to Agent Orange    GERD (gastroesophageal reflux disease)    HOH (hard of hearing)    bilateral   Hyperlipidemia    Hypertrophy of prostate    benign    [name] (HCC)    Low back pain    Lumbar spine pain    compression fracture    Malignant neoplasm (HCC)    skin    Metatarsalgia    Osteopenia    Plantar fibromatosis    Stroke (HCC) 78 yrs old   no residual effects   Transient ischemic attack    Trigger finger    Past Surgical History:  Procedure Laterality Date   BACK SURGERY     BROW LIFT Bilateral 03/26/2021   Procedure: BLEPHAROPLASTY UPPER EYELID; W/EXCESS SKIN BROW PTOSIS REPAIR  BLEPHAROPTOSIS REPAIR; RESECT EX BILATERAL;  Surgeon: Zacarias Hermann, MD;  Location: [facility];  Service: Ophthalmology;  Laterality: Bilateral;   LOOP RECORDER REMOVAL N/A 06/06/2023   Procedure: LOOP RECORDER REMOVAL;  Surgeon: Verona Goodwill, MD;  Location: [facility] INVASIVE CV LAB;  Service: Cardiovascular;  Laterality: N/A;   PACEMAKER IMPLANT N/A 06/06/2023   Procedure: PACEMAKER IMPLANT;  Surgeon: Verona Goodwill, MD;  Location: [facility] INVASIVE CV LAB;  Service: Cardiovascular;   Laterality: N/A;   WRIST SURGERY Right    Patient Active Problem List   Diagnosis Date Noted   Lightheadedness 05/08/2023   Implantable loop recorder present 03/31/2023   NSVT (nonsustained ventricular tachycardia) (HCC) 02/13/2023   SVT (supraventricular tachycardia) - non sustained 02/13/2023   Near syncope 12/13/2022   [name] (HCC) 12/13/2022   Sinus bradycardia 12/13/2022   History of CVA (cerebrovascular accident) 12/13/2022   Hyperlipidemia 12/13/2022   Dyspnea on exertion 12/13/2022    PCP: Carolanne Church, MD  Referring Provider: Carolanne Church, MD  REFERRING PROVIDER:   REFERRING DIAGNOSIS:  Z74.09 (ICD-10-CM) - Declining mobility   THERAPY DIAG: Unsteadiness on feet  Other abnormalities of gait and mobility  Difficulty in walking, not elsewhere classified  ONSET DATE: chronic  FOLLOW UP APPT WITH PROVIDER: PRN   RATIONALE FOR EVALUATION AND TREATMENT: Rehabilitation   SUBJECTIVE:  Chief Complaint: [name] is a 78 year old male with Hx of [name] and Parkinsonism.  Pt. Reports difficulty with stairs and recent falls reported.  Pt. States he is not walking as much and was walking 5 miles/day.    Pertinent History Pt is a 78 year old male with Hx of Lewy Body Demantia and Parkinsonism. Past medical history is significant for [name], TIA, CVA in 2013, asymmetrical hearing loss, dypsnea on exertion, anxiety, HLD, asthma. Pt wants to walk up to 5 miles per day; he walks 2-3 miles presently (pt walks around neighborhood streets mostly). Patient reports he did use walking stick, but using it has irritated his back (known arthritis per patient). Patient has diplopia and reports blurry vision at a distance. Pt likes to walk in woods and recently limited  with descending stairs.    Pain: Yes; comorbid back pain/OA Numbness/Tingling: No Focal Weakness: No. Recent changes in overall health/medication: Yes; steadily worsening [name]  Prior history of physical therapy for balance:  Yes; see PT notes  Falls: Has patient fallen in last 6 months? Yes, Number of falls: 2 - fell on stairs in garage about 1 month ago/ fell the other day while putting pants on "got tangled up" Directional pattern for falls: Yes; falling forward when going down stairs and when he tripped one year ago   Imaging: Yes ;  CLINICAL DATA:  Transient ischemic attack (TIA). Neuro deficit, acute, stroke suspected.   Prior level of function: Independent Occupational demands: Retired Presenter, broadcasting: Walking routine; pt used to enjoy fishing   Red flags (bowel/bladder changes, saddle paresthesia, personal history of cancer, h/o spinal tumors, h/o compression fx, h/o abdominal aneurysm, abdominal pain, chills/fever, night sweats, nausea, vomiting, unrelenting pain): Negative  Precautions: Fall history, fall risk; pt has pacemaker  Weight Bearing Restrictions: No  Living Environment Lives with: lives with their spouse Lives in: House/apartment; 4-5 steps with rail on R side going up.  lives in house 4-5 STE with rail remodeled bathroom with seat available (does not use) 1 level with bonus room - 12 steps with rail (where his bedroom is located) grandchildren local- enjoys playing "horse" basketball with them   Patient Goals: Maintain his level of function/independence    OBJECTIVE:   Patient Surveys  Berg: 54/56 ABC: 57% LEFS: 34 out of 80  Cognition Patient is oriented to person, place, and time.  Recent memory is intact.  Remote memory is moderately impaired.  Attention span and concentration are intact.  Expressive speech is intact, hypophonic.  Benefits from tx. In a quiet space Patient's fund of knowledge is within normal limits for educational  level.    Gross Musculoskeletal Assessment/Observation Tremor: No resting tremor/intention tremor Bulk: Normal Tone: Hypotonic  Mask face, hypophonia.   GAIT: Distance walked: 470 ft  Assistive device utilized: None Level of assistance: SBA Comments: Good heel strike and velocity/cadence. Absent arm swing and rigid trunk. Ambulate outside on varying terrain/ parking lot.  Ascending/descending stairs  Posture: Rounded shoulder, forward head, trunk remains slightly flexed in sitting/standing    LE MMT:  MMT (out of 5) Right 03/26/2024 Left 03/26/2024  Hip flexion 5 5  Hip extension    Hip abduction (seated) 5 5  Hip adduction (seated) 5 5  Hip internal rotation    Hip external rotation    Knee flexion 5 5  Knee extension 5 5  Ankle dorsiflexion 5 5  Ankle plantarflexion    Ankle inversion    Ankle eversion    (* =  pain; Blank rows = not tested)   Sensation Grossly intact to light touch bilateral LEs as determined by testing dermatomes L2-S2. Proprioception, and hot/cold testing deferred on this date.  Reflexes Deferred  Cranial Nerves Visual acuity and visual fields are intact  Extraocular muscles are intact  -pt exhibits convergence insufficiency  Facial sensation is intact bilaterally  Facial strength is intact bilaterally  Hearing loss is mild as tested by gross conversation Palate elevates midline, normal phonation  Shoulder shrug strength is intact  Tongue protrudes slightly L    Coordination/Cerebellar Finger to Nose: Good Heel to Shin: Good Finger Opposition: Good   FUNCTIONAL OUTCOME MEASURES   Results Comments  BERG  Low fall risk          TUG NT   5TSTS 8.2 seconds Slight backward LOB during 5th rep, pt   (Blank rows = not tested)   FUNCTIONAL TASKS  Stairs: pt demonstrates reciprocal stair ascent with no handrail support, no loss of balance and sound toe clearance with each successive step Sit to stand: pt performs readily with no  upper limb support, intermittently pushes posterior thighs on chair to assist and has one incident of mild posterior LOB upon attaining standing position    TODAY'S TREATMENT  03/27/2024  Therapeutic Exercise - PT education  Nustep L5 B UE/LE 10 min. Standing step touches (1st/2nd/3rd step)- progressedto standing on Airex pad.  SBA for safety.  No LOB.   Walking outside on stairs with focus on heel clearance.   Discussed goals of PT, excellent performance and performing superior to cut-off scores for 5x sit to stand. We discussed plan of care and completing higher-level balance and fall risk assessment next visit.     PATIENT EDUCATION:  Education details: see above for patient education details Person educated: Patient Education method: Explanation Education comprehension: verbalized understanding   HOME EXERCISE PROGRAM: Reviewed currently HEP   ASSESSMENT:  CLINICAL IMPRESSION: Patient is a 78 y.o. male who was seen today for physical therapy evaluation and treatment for gait and mobility deficits secondary to [name] and parkinsonism. Pt demonstrates Parkinson's features with gait e.g. decreased arm swing and trunk rotation. Pt does not demonstrate festinating or shuffling gait pattern; he has WNL initiation of gait and sit to stand. He has excellent 5xSTS  and Berg balance score.   Objective impairments include Abnormal gait, decreased balance, decreased cognition, and decreased coordination. These impairments are limiting patient from shopping and community activity. Personal factors including Age and 3+ comorbidities: (Hx of CVA, [name], parkinsonism, agent orange exposure, osteopenia, Hx of skin cancer, low back pain/spondylosis with Hx of compression Fx) are also affecting patient's functional outcome. Patient will benefit from skilled PT to address above impairments and improve overall function.  REHAB POTENTIAL: Good  CLINICAL DECISION MAKING:  Unstable/unpredictable  EVALUATION COMPLEXITY: High   GOALS: Goals reviewed with patient? Yes  SHORT TERM GOALS: Target date: 04/16/2024  Pt will be independent with HEP in order to improve strength and balance in order to decrease fall risk and improve function at home. Baseline: initial eval: Formal HEP to be initiated at second follow-up visit.  Goal status: INITIAL   LONG TERM GOALS: Target date: 05/07/2024  Pt will increase LEFS to >50 out of 80 to improve functional mobility/ decrease fall risk.        Baseline: 34 out of 80 Goal status: INITIAL  2.  Pt will increase ABC to >70% to improve confidence/ independence with walking and  descending stairs.        Baseline: 57% Goal status: INITIAL  3.  Pt. Able to descend stairs with reciprocal pattern and consistent clearance of heel to decrease fall risk.        Baseline: recent fall descending stairs.  Extra time to complete/ consistently clear heel.        Goal status: INITIAL   PLAN: PT FREQUENCY: 2x/week  PT DURATION: 6 weeks  PLANNED INTERVENTIONS: Therapeutic exercises, Therapeutic activity, Neuromuscular re-education, Balance training, Gait training, Patient/Family education, Joint manipulation, Joint mobilization, Canalith repositioning, Aquatic Therapy, Dry Needling, Cognitive remediation, Electrical stimulation, Spinal manipulation, Spinal mobilization, Cryotherapy, Moist heat, Traction, Ultrasound, Ionotophoresis 4mg /ml Dexamethasone, and Manual therapy  PLAN FOR NEXT SESSION: Continue with interventions to promote arm-swing and large-amplitude transfers and stepping drills. Advance HEP with successive PT visits.    Lendell Quarry, PT, DPT # [phone] 03/27/2024, 7:10 AM

## 2024-03-28 ENCOUNTER — Encounter: Payer: Self-pay | Admitting: Physical Therapy

## 2024-03-28 ENCOUNTER — Ambulatory Visit: Admitting: Physical Therapy

## 2024-03-28 ENCOUNTER — Other Ambulatory Visit: Payer: Self-pay | Admitting: Unknown Physician Specialty

## 2024-03-28 DIAGNOSIS — R2681 Unsteadiness on feet: Secondary | ICD-10-CM | POA: Diagnosis not present

## 2024-03-28 DIAGNOSIS — R262 Difficulty in walking, not elsewhere classified: Secondary | ICD-10-CM

## 2024-03-28 DIAGNOSIS — R2689 Other abnormalities of gait and mobility: Secondary | ICD-10-CM

## 2024-03-28 DIAGNOSIS — R519 Headache, unspecified: Secondary | ICD-10-CM

## 2024-03-28 DIAGNOSIS — M6281 Muscle weakness (generalized): Secondary | ICD-10-CM

## 2024-03-28 NOTE — Therapy (Signed)
 OUTPATIENT PHYSICAL THERAPY BALANCE TREATMENT  Patient Name: Roger Sanchez MRN: [id number] DOB:[birth year], 78 y.o., male Today's Date: 03/28/2024   PT End of Session - 03/28/24 0829     Visit Number 2    Number of Visits 12    Date for PT Re-Evaluation 05/07/24    PT Start Time 0854    PT Stop Time 0945    PT Time Calculation (min) 51 min    Equipment Utilized During Treatment Gait belt    Activity Tolerance Patient tolerated treatment well    Behavior During Therapy WFL for tasks assessed/performed            Past Medical History:  Diagnosis Date   Arthralgia    shoulder    Asthma    Asymmetrical hearing loss    Atypical chest pain    Brachial plexus disorders    Bradycardia    Carpal tunnel syndrome    Cerebral infarction (HCC)    Closed fracture of wrist    Exposure to Agent Orange    GERD (gastroesophageal reflux disease)    HOH (hard of hearing)    bilateral   Hyperlipidemia    Hypertrophy of prostate    benign    [name] (HCC)    Low back pain    Lumbar spine pain    compression fracture    Malignant neoplasm (HCC)    skin    Metatarsalgia    Osteopenia    Plantar fibromatosis    Stroke (HCC) 78 yrs old   no residual effects   Transient ischemic attack    Trigger finger    Past Surgical History:  Procedure Laterality Date   BACK SURGERY     BROW LIFT Bilateral 03/26/2021   Procedure: BLEPHAROPLASTY UPPER EYELID; W/EXCESS SKIN BROW PTOSIS REPAIR  BLEPHAROPTOSIS REPAIR; RESECT EX BILATERAL;  Surgeon: Zacarias Hermann, MD;  Location: [facility];  Service: Ophthalmology;  Laterality: Bilateral;   LOOP RECORDER REMOVAL N/A 06/06/2023   Procedure: LOOP RECORDER REMOVAL;  Surgeon: Verona Goodwill, MD;  Location: [facility] INVASIVE CV LAB;  Service: Cardiovascular;  Laterality: N/A;   PACEMAKER IMPLANT N/A 06/06/2023   Procedure: PACEMAKER IMPLANT;  Surgeon: Verona Goodwill, MD;  Location: [facility] INVASIVE CV LAB;  Service: Cardiovascular;   Laterality: N/A;   WRIST SURGERY Right    Patient Active Problem List   Diagnosis Date Noted   Lightheadedness 05/08/2023   Implantable loop recorder present 03/31/2023   NSVT (nonsustained ventricular tachycardia) (HCC) 02/13/2023   SVT (supraventricular tachycardia) - non sustained 02/13/2023   Near syncope 12/13/2022   [name] (HCC) 12/13/2022   Sinus bradycardia 12/13/2022   History of CVA (cerebrovascular accident) 12/13/2022   Hyperlipidemia 12/13/2022   Dyspnea on exertion 12/13/2022    PCP: Carolanne Church, MD  Referring Provider: Carolanne Church, MD  REFERRING PROVIDER:   REFERRING DIAGNOSIS:  Z74.09 (ICD-10-CM) - Declining mobility   THERAPY DIAG: Unsteadiness on feet  Other abnormalities of gait and mobility  Difficulty in walking, not elsewhere classified  Muscle weakness (generalized)  ONSET DATE: chronic  FOLLOW UP APPT WITH PROVIDER: PRN   RATIONALE FOR EVALUATION AND TREATMENT: Rehabilitation   SUBJECTIVE:  Chief Complaint: [name] is a 78 year old male with Hx of [name] and Parkinsonism.  Pt. Reports difficulty with stairs and recent falls reported.  Pt. States he is not walking as much and was walking 5 miles/day.    Pertinent History Pt is a 78 year old male with Hx of Lewy Body Demantia and Parkinsonism. Past medical history is significant for [name], TIA, CVA in 2013, asymmetrical hearing loss, dypsnea on exertion, anxiety, HLD, asthma. Pt wants to walk up to 5 miles per day; he walks 2-3 miles presently (pt walks around neighborhood streets mostly). Patient reports he did use walking stick, but using it has irritated his back (known arthritis per patient). Patient has diplopia and reports blurry vision at a distance. Pt likes to walk  in woods and recently limited with descending stairs.    Pain: Yes; comorbid back pain/OA Numbness/Tingling: No Focal Weakness: No. Recent changes in overall health/medication: Yes; steadily worsening [name]  Prior history of physical therapy for balance:  Yes; see PT notes  Falls: Has patient fallen in last 6 months? Yes, Number of falls: 2 - fell on stairs in garage about 1 month ago/ fell the other day while putting pants on "got tangled up" Directional pattern for falls: Yes; falling forward when going down stairs and when he tripped one year ago   Imaging: Yes ;  CLINICAL DATA:  Transient ischemic attack (TIA). Neuro deficit, acute, stroke suspected.   Prior level of function: Independent Occupational demands: Retired Presenter, broadcasting: Walking routine; pt used to enjoy fishing   Red flags (bowel/bladder changes, saddle paresthesia, personal history of cancer, h/o spinal tumors, h/o compression fx, h/o abdominal aneurysm, abdominal pain, chills/fever, night sweats, nausea, vomiting, unrelenting pain): Negative  Precautions: Fall history, fall risk; pt has pacemaker  Weight Bearing Restrictions: No  Living Environment Lives with: lives with their spouse Lives in: House/apartment; 4-5 steps with rail on R side going up.  lives in house 4-5 STE with rail remodeled bathroom with seat available (does not use) 1 level with bonus room - 12 steps with rail (where his bedroom is located) grandchildren local- enjoys playing "horse" basketball with them   Patient Goals: Maintain his level of function/independence    OBJECTIVE:   Patient Surveys  Berg: 54/56 ABC: 57% LEFS: 34 out of 80  Cognition Patient is oriented to person, place, and time.  Recent memory is intact.  Remote memory is moderately impaired.  Attention span and concentration are intact.  Expressive speech is intact, hypophonic.  Benefits from tx. In a quiet space Patient's fund of knowledge is within  normal limits for educational level.    Gross Musculoskeletal Assessment/Observation Tremor: No resting tremor/intention tremor Bulk: Normal Tone: Hypotonic  Mask face, hypophonia.   GAIT: Distance walked: 470 ft  Assistive device utilized: None Level of assistance: SBA Comments: Good heel strike and velocity/cadence. Absent arm swing and rigid trunk. Ambulate outside on varying terrain/ parking lot.  Ascending/descending stairs  Posture: Rounded shoulder, forward head, trunk remains slightly flexed in sitting/standing    LE MMT:  MMT (out of 5) Right 03/26/2024 Left 03/26/2024  Hip flexion 5 5  Hip extension    Hip abduction (seated) 5 5  Hip adduction (seated) 5 5  Hip internal rotation    Hip external rotation    Knee flexion 5 5  Knee extension 5 5  Ankle dorsiflexion 5 5  Ankle plantarflexion    Ankle inversion    Ankle eversion    (* =  pain; Blank rows = not tested)   Sensation Grossly intact to light touch bilateral LEs as determined by testing dermatomes L2-S2. Proprioception, and hot/cold testing deferred on this date.  Reflexes Deferred  Cranial Nerves Visual acuity and visual fields are intact  Extraocular muscles are intact  -pt exhibits convergence insufficiency  Facial sensation is intact bilaterally  Facial strength is intact bilaterally  Hearing loss is mild as tested by gross conversation Palate elevates midline, normal phonation  Shoulder shrug strength is intact  Tongue protrudes slightly L    Coordination/Cerebellar Finger to Nose: Good Heel to Shin: Good Finger Opposition: Good   FUNCTIONAL OUTCOME MEASURES   Results Comments  BERG  Low fall risk          TUG NT   5TSTS 8.2 seconds Slight backward LOB during 5th rep, pt   (Blank rows = not tested)   FUNCTIONAL TASKS  Stairs: pt demonstrates reciprocal stair ascent with no handrail support, no loss of balance and sound toe clearance with each successive step Sit to stand:  pt performs readily with no upper limb support, intermittently pushes posterior thighs on chair to assist and has one incident of mild posterior LOB upon attaining standing position   TODAY'S TREATMENT  03/28/2024  Subjective:  Pt. States he walked 4 miles yesterday and did 1.5 hours of walking this morning.  No LOB or pain reported this morning.  Pt. Does not walk in woods this time of year because of snakes/ spider webs.    Therapeutic Exercise:    Nustep L5 B UE/LE 10 min.  Discussed walking at home.  Reviewed HEP  There.act.:  Star ex.: Blaze pods (6 pods)- SLS with pod touches 1 min. On L/R x 2.  Average 34 taps.    Walking in hallway 4 laps (increase cadence)- added alt. UE/LE touches.    Blaze pod step touches (1st/2nd/3rd step).  Random taps 1 min. X 2 each on L/R.    Walking outside on stairs with focus on heel clearance.  Forward/ backwards 2x.  Ascending/descending stairs with no UE assist.  SBA for cuing/ safety.    Walking in grassy terrain/ up and down curbs 5x.    Amb. In //-bars with 12" hurdle clearance.  Forward/ lateral with no UE assist.  Challenged more with lateral steps as compared to forward step overs.     PATIENT EDUCATION:  Education details: see above for patient education details Person educated: Patient Education method: Explanation Education comprehension: verbalized understanding   HOME EXERCISE PROGRAM: Reviewed currently HEP   ASSESSMENT:  CLINICAL IMPRESSION: Pt highly motivated and works hard during tx. Session.  Primary focus on higher level balance and safety managing stairs.  Pt. Able to progress SLS to use of Star and Blaze pods with SBA for safety/ cuing.  No LOB during tx. And pt. Did well step up onto curb and walking around grass.  Pt. Challenged with outside stairs as compared to inside.  Patient will benefit from skilled PT to address above impairments and improve overall function.  REHAB POTENTIAL: Good  CLINICAL DECISION  MAKING: Unstable/unpredictable  EVALUATION COMPLEXITY: High   GOALS: Goals reviewed with patient? Yes  SHORT TERM GOALS: Target date: 04/16/2024  Pt will be independent with HEP in order to improve strength and balance in order to decrease fall risk and improve function at home. Baseline: initial eval: Formal HEP to be initiated at second follow-up visit.  Goal status: INITIAL   LONG TERM GOALS: Target  date: 05/07/2024  Pt will increase LEFS to >50 out of 80 to improve functional mobility/ decrease fall risk.        Baseline: 34 out of 80 Goal status: INITIAL  2.  Pt will increase ABC to >70% to improve confidence/ independence with walking and descending stairs.        Baseline: 57% Goal status: INITIAL  3.  Pt. Able to descend stairs with reciprocal pattern and consistent clearance of heel to decrease fall risk.        Baseline: recent fall descending stairs.  Extra time to complete/ consistently clear heel.        Goal status: INITIAL   PLAN: PT FREQUENCY: 2x/week  PT DURATION: 6 weeks  PLANNED INTERVENTIONS: Therapeutic exercises, Therapeutic activity, Neuromuscular re-education, Balance training, Gait training, Patient/Family education, Joint manipulation, Joint mobilization, Canalith repositioning, Aquatic Therapy, Dry Needling, Cognitive remediation, Electrical stimulation, Spinal manipulation, Spinal mobilization, Cryotherapy, Moist heat, Traction, Ultrasound, Ionotophoresis 4mg /ml Dexamethasone, and Manual therapy  PLAN FOR NEXT SESSION: Continue with interventions to promote arm-swing and large-amplitude transfers and stepping drills. Advance HEP with successive PT visits.    Lendell Quarry, PT, DPT # [phone] 03/28/2024, 12:52 PM

## 2024-04-02 ENCOUNTER — Ambulatory Visit: Payer: Self-pay | Admitting: Physician Assistant

## 2024-04-02 ENCOUNTER — Encounter: Payer: Self-pay | Admitting: Physical Therapy

## 2024-04-02 ENCOUNTER — Ambulatory Visit: Admitting: Physical Therapy

## 2024-04-02 ENCOUNTER — Ambulatory Visit: Attending: Physician Assistant

## 2024-04-02 DIAGNOSIS — R2681 Unsteadiness on feet: Secondary | ICD-10-CM | POA: Diagnosis not present

## 2024-04-02 DIAGNOSIS — R55 Syncope and collapse: Secondary | ICD-10-CM | POA: Diagnosis not present

## 2024-04-02 DIAGNOSIS — I350 Nonrheumatic aortic (valve) stenosis: Secondary | ICD-10-CM

## 2024-04-02 DIAGNOSIS — R262 Difficulty in walking, not elsewhere classified: Secondary | ICD-10-CM

## 2024-04-02 DIAGNOSIS — R2689 Other abnormalities of gait and mobility: Secondary | ICD-10-CM

## 2024-04-02 DIAGNOSIS — M6281 Muscle weakness (generalized): Secondary | ICD-10-CM

## 2024-04-02 LAB — ECHOCARDIOGRAM COMPLETE
AR max vel: 1.78 cm2
AV Area VTI: 1.68 cm2
AV Area mean vel: 1.7 cm2
AV Mean grad: 13 mmHg
AV Peak grad: 23.3 mmHg
Ao pk vel: 2.42 m/s
Area-P 1/2: 2.83 cm2
Calc EF: 62.4 %
S' Lateral: 2.9 cm
Single Plane A2C EF: 62.7 %
Single Plane A4C EF: 60.1 %

## 2024-04-02 NOTE — Therapy (Signed)
 OUTPATIENT PHYSICAL THERAPY BALANCE TREATMENT  Patient Name: Roger Sanchez MRN: [id number] DOB:06/19/1946, 78 y.o., male Today's Date: 04/02/2024   PT End of Session - 04/02/24 0723     Visit Number 3    Number of Visits 12    Date for PT Re-Evaluation 05/07/24    PT Start Time 0727    PT Stop Time 0819    PT Time Calculation (min) 52 min    Equipment Utilized During Treatment Gait belt    Activity Tolerance Patient tolerated treatment well    Behavior During Therapy WFL for tasks assessed/performed            Past Medical History:  Diagnosis Date   Arthralgia    shoulder    Asthma    Asymmetrical hearing loss    Atypical chest pain    Brachial plexus disorders    Bradycardia    Carpal tunnel syndrome    Cerebral infarction (HCC)    Closed fracture of wrist    Exposure to Agent Orange    GERD (gastroesophageal reflux disease)    HOH (hard of hearing)    bilateral   Hyperlipidemia    Hypertrophy of prostate    benign    [name] (HCC)    Low back pain    Lumbar spine pain    compression fracture    Malignant neoplasm (HCC)    skin    Metatarsalgia    Osteopenia    Plantar fibromatosis    Stroke (HCC) 78 yrs old   no residual effects   Transient ischemic attack    Trigger finger    Past Surgical History:  Procedure Laterality Date   BACK SURGERY     BROW LIFT Bilateral 03/26/2021   Procedure: BLEPHAROPLASTY UPPER EYELID; W/EXCESS SKIN BROW PTOSIS REPAIR  BLEPHAROPTOSIS REPAIR; RESECT EX BILATERAL;  Surgeon: Zacarias Hermann, MD;  Location: [facility];  Service: Ophthalmology;  Laterality: Bilateral;   LOOP RECORDER REMOVAL N/A 06/06/2023   Procedure: LOOP RECORDER REMOVAL;  Surgeon: Verona Goodwill, MD;  Location: [facility] INVASIVE CV LAB;  Service: Cardiovascular;  Laterality: N/A;   PACEMAKER IMPLANT N/A 06/06/2023   Procedure: PACEMAKER IMPLANT;  Surgeon: Verona Goodwill, MD;  Location: [facility] INVASIVE CV LAB;  Service: Cardiovascular;   Laterality: N/A;   WRIST SURGERY Right    Patient Active Problem List   Diagnosis Date Noted   Lightheadedness 05/08/2023   Implantable loop recorder present 03/31/2023   NSVT (nonsustained ventricular tachycardia) (HCC) 02/13/2023   SVT (supraventricular tachycardia) - non sustained 02/13/2023   Near syncope 12/13/2022   [name] (HCC) 12/13/2022   Sinus bradycardia 12/13/2022   History of CVA (cerebrovascular accident) 12/13/2022   Hyperlipidemia 12/13/2022   Dyspnea on exertion 12/13/2022    PCP: Carolanne Church, MD  Referring Provider: Carolanne Church, MD  REFERRING PROVIDER:   REFERRING DIAGNOSIS:  Z74.09 (ICD-10-CM) - Declining mobility   THERAPY DIAG: Unsteadiness on feet  Other abnormalities of gait and mobility  Difficulty in walking, not elsewhere classified  Muscle weakness (generalized)  ONSET DATE: chronic  FOLLOW UP APPT WITH PROVIDER: PRN   RATIONALE FOR EVALUATION AND TREATMENT: Rehabilitation   SUBJECTIVE:  Chief Complaint: [name] is a 78 year old male with Hx of [name] and Parkinsonism.  Pt. Reports difficulty with stairs and recent falls reported.  Pt. States he is not walking as much and was walking 5 miles/day.    Pertinent History Pt is a 78 year old male with Hx of Lewy Body Demantia and Parkinsonism. Past medical history is significant for [name], TIA, CVA in 2013, asymmetrical hearing loss, dypsnea on exertion, anxiety, HLD, asthma. Pt wants to walk up to 5 miles per day; he walks 2-3 miles presently (pt walks around neighborhood streets mostly). Patient reports he did use walking stick, but using it has irritated his back (known arthritis per patient). Patient has diplopia and reports blurry vision at a distance. Pt likes to walk  in woods and recently limited with descending stairs.    Pain: Yes; comorbid back pain/OA Numbness/Tingling: No Focal Weakness: No. Recent changes in overall health/medication: Yes; steadily worsening [name]  Prior history of physical therapy for balance:  Yes; see PT notes  Falls: Has patient fallen in last 6 months? Yes, Number of falls: 2 - fell on stairs in garage about 1 month ago/ fell the other day while putting pants on "got tangled up" Directional pattern for falls: Yes; falling forward when going down stairs and when he tripped one year ago   Imaging: Yes ;  CLINICAL DATA:  Transient ischemic attack (TIA). Neuro deficit, acute, stroke suspected.   Prior level of function: Independent Occupational demands: Retired Presenter, broadcasting: Walking routine; pt used to enjoy fishing   Red flags (bowel/bladder changes, saddle paresthesia, personal history of cancer, h/o spinal tumors, h/o compression fx, h/o abdominal aneurysm, abdominal pain, chills/fever, night sweats, nausea, vomiting, unrelenting pain): Negative  Precautions: Fall history, fall risk; pt has pacemaker  Weight Bearing Restrictions: No  Living Environment Lives with: lives with their spouse Lives in: House/apartment; 4-5 steps with rail on R side going up.  lives in house 4-5 STE with rail remodeled bathroom with seat available (does not use) 1 level with bonus room - 12 steps with rail (where his bedroom is located) grandchildren local- enjoys playing "horse" basketball with them   Patient Goals: Maintain his level of function/independence    OBJECTIVE:   Patient Surveys  Berg: 54/56 ABC: 57% LEFS: 34 out of 80  Cognition Patient is oriented to person, place, and time.  Recent memory is intact.  Remote memory is moderately impaired.  Attention span and concentration are intact.  Expressive speech is intact, hypophonic.  Benefits from tx. In a quiet space Patient's fund of knowledge is within  normal limits for educational level.    Gross Musculoskeletal Assessment/Observation Tremor: No resting tremor/intention tremor Bulk: Normal Tone: Hypotonic  Mask face, hypophonia.   GAIT: Distance walked: 470 ft  Assistive device utilized: None Level of assistance: SBA Comments: Good heel strike and velocity/cadence. Absent arm swing and rigid trunk. Ambulate outside on varying terrain/ parking lot.  Ascending/descending stairs  Posture: Rounded shoulder, forward head, trunk remains slightly flexed in sitting/standing    LE MMT:  MMT (out of 5) Right 03/26/2024 Left 03/26/2024  Hip flexion 5 5  Hip extension    Hip abduction (seated) 5 5  Hip adduction (seated) 5 5  Hip internal rotation    Hip external rotation    Knee flexion 5 5  Knee extension 5 5  Ankle dorsiflexion 5 5  Ankle plantarflexion    Ankle inversion    Ankle eversion    (* =  pain; Blank rows = not tested)   Sensation Grossly intact to light touch bilateral LEs as determined by testing dermatomes L2-S2. Proprioception, and hot/cold testing deferred on this date.  Reflexes Deferred  Cranial Nerves Visual acuity and visual fields are intact  Extraocular muscles are intact  -pt exhibits convergence insufficiency  Facial sensation is intact bilaterally  Facial strength is intact bilaterally  Hearing loss is mild as tested by gross conversation Palate elevates midline, normal phonation  Shoulder shrug strength is intact  Tongue protrudes slightly L    Coordination/Cerebellar Finger to Nose: Good Heel to Shin: Good Finger Opposition: Good   FUNCTIONAL OUTCOME MEASURES   Results Comments  BERG  Low fall risk          TUG NT   5TSTS 8.2 seconds Slight backward LOB during 5th rep, pt   (Blank rows = not tested)   FUNCTIONAL TASKS  Stairs: pt demonstrates reciprocal stair ascent with no handrail support, no loss of balance and sound toe clearance with each successive step Sit to stand:  pt performs readily with no upper limb support, intermittently pushes posterior thighs on chair to assist and has one incident of mild posterior LOB upon attaining standing position   TODAY'S TREATMENT  04/02/2024  Subjective:  Pt. Did not walk as much this past weekend secondary to family gathering.   No new complaints.  Pt. States he is scheduled for cataract surgery in a couple weeks and has an EKG today to recheck pacemaker.    Therapeutic Exercise:  No charge  Nustep L5 B UE/LE 10 min. (Discussed weekend activities).    Reviewed HEP  There.act.:  Walking in //-bars:  Airex step ups/down 3 laps.  Lateral walking with Airex pads 3 laps.    Walking in hallway 6 laps (increase cadence)- added alt. UE/LE touches.  2 laps with cuing to increase recip. arm swing.    Resisted gait 2BTB 5x all 4-planes in //-bars with no UE assist.    6" step ups (forward/lateral/backwards)- 10x with no UE assist at stairs.     12" step ups at stairs with light to no UE assist 10x on L/R  STS from gray chair with no UE assist and added lunges with B shoulder flexion 10x on L/R.  No LOB and pt. Instructed to slow down.    BOSU step ups in //-bars with light UE and progressing to no UE assist.  Good ankle/LE control and pts. Confidence improved after several reps.     NOT TODAY: Star ex.: Blaze pods (6 pods)- SLS with pod touches 1 min. On L/R x 2.  Average 34 taps.    Blaze pod step touches (1st/2nd/3rd step).  Random taps 1 min. X 2 each on L/R.    Walking outside on stairs with focus on heel clearance.  Forward/ backwards 2x.  Ascending/descending stairs with no UE assist.  SBA for cuing/ safety.    Walking in grassy terrain/ up and down curbs 5x.    Amb. In //-bars with 12" hurdle clearance.  Forward/ lateral with no UE assist.  Challenged more with lateral steps as compared to forward step overs.     PATIENT EDUCATION:  Education details: see above for patient education details Person  educated: Patient Education method: Explanation Education comprehension: verbalized understanding   HOME EXERCISE PROGRAM: Reviewed currently HEP   ASSESSMENT:  CLINICAL IMPRESSION: Pt highly motivated and works hard during tx. Session.  Primary focus on higher level balance and safety  managing stairs.  Pt. Able to step up/ down on 12" steps and BOSU with no UE assist and no LOB during.  No outside walking/ activity secondary to rainy weather.  Pt. Challenged with BOSU but improved quickly after several reps.   Patient will benefit from skilled PT to address above impairments and improve overall function.  REHAB POTENTIAL: Good  CLINICAL DECISION MAKING: Unstable/unpredictable  EVALUATION COMPLEXITY: High   GOALS: Goals reviewed with patient? Yes  SHORT TERM GOALS: Target date: 04/16/2024  Pt will be independent with HEP in order to improve strength and balance in order to decrease fall risk and improve function at home. Baseline: initial eval: Formal HEP to be initiated at second follow-up visit.  Goal status: INITIAL   LONG TERM GOALS: Target date: 05/07/2024  Pt will increase LEFS to >50 out of 80 to improve functional mobility/ decrease fall risk.        Baseline: 34 out of 80 Goal status: INITIAL  2.  Pt will increase ABC to >70% to improve confidence/ independence with walking and descending stairs.        Baseline: 57% Goal status: INITIAL  3.  Pt. Able to descend stairs with reciprocal pattern and consistent clearance of heel to decrease fall risk.        Baseline: recent fall descending stairs.  Extra time to complete/ consistently clear heel.        Goal status: INITIAL   PLAN: PT FREQUENCY: 2x/week  PT DURATION: 6 weeks  PLANNED INTERVENTIONS: Therapeutic exercises, Therapeutic activity, Neuromuscular re-education, Balance training, Gait training, Patient/Family education, Joint manipulation, Joint mobilization, Canalith repositioning, Aquatic Therapy, Dry  Needling, Cognitive remediation, Electrical stimulation, Spinal manipulation, Spinal mobilization, Cryotherapy, Moist heat, Traction, Ultrasound, Ionotophoresis 4mg /ml Dexamethasone, and Manual therapy  PLAN FOR NEXT SESSION: Continue with interventions to promote arm-swing and large-amplitude transfers and stepping drills. Advance HEP with successive PT visits.    Lendell Quarry, PT, DPT # [phone] 04/02/2024, 8:27 AM

## 2024-04-03 ENCOUNTER — Encounter: Payer: Self-pay | Admitting: Ophthalmology

## 2024-04-03 NOTE — Anesthesia Preprocedure Evaluation (Addendum)
 Anesthesia Evaluation  Patient identified by MRN, date of birth, ID band Patient awake    Reviewed: Allergy & Precautions, H&P , NPO status , Patient's Chart, lab work & pertinent test results  Airway Mallampati: III  TM Distance: >3 FB Neck ROM: Full    Dental no notable dental hx. (+) Poor Dentition   Pulmonary neg pulmonary ROS, asthma    Pulmonary exam normal breath sounds clear to auscultation       Cardiovascular negative cardio ROS Normal cardiovascular exam Rhythm:Regular Rate:Normal  Cardiac pacemake placed 06-06-23. Placed due to  Interval presyncope.  Occurred while awake.  Sinus pause.  Also chronotropic incompetence.   04-02-24 echo Indications:    R55 Syncope; I35.0 Nonrheumatic aortic (valve) stenosis    History:        Patient has prior history of Echocardiogram examinations.                  Pacemaker, Signs/Symptoms:Syncope; Risk  Factors:Dyslipidemia.    Sonographer:   Alicia Inoue  Referring Phys: [id number] RYAN M DUNN   IMPRESSIONS     1. Left ventricular ejection fraction, by estimation, is 60 to 65%. The  left ventricle has normal function. The left ventricle has no regional  wall motion abnormalities. Left ventricular diastolic parameters are  consistent with Grade I diastolic  dysfunction (impaired relaxation).   2. Right ventricular systolic function is normal. The right ventricular  size is normal.   3. Left atrial size was mild to moderately dilated.   4. The mitral valve is normal in structure. No evidence of mitral valve  regurgitation.   5. Mean Aov gradient , PG , AVA 1.5cm2. The aortic valve is  tricuspid. Aortic valve regurgitation is not visualized. Mild to moderate  aortic valve stenosis.    DATE TEST EF   2/24 Echo  60-65%   2/24 Myoview      % No ischemia  4/24 CTA   Nonobstructive CAD       Neuro/Psych  PSYCHIATRIC DISORDERS     Dementia  Neuromuscular disease  CVA negative neurological ROS  negative psych ROS   GI/Hepatic negative GI ROS, Neg liver ROS,GERD  ,,  Endo/Other  negative endocrine ROS    Renal/GU negative Renal ROS  negative genitourinary   Musculoskeletal negative musculoskeletal ROS (+)    Abdominal   Peds negative pediatric ROS (+)  Hematology negative hematology ROS (+)   Anesthesia Other Findings exposure to Agent Orange  Asthma Low back pain  Closed fracture of wrist Hyperlipidemia  Hypertrophy of prostate Plantar fibromatosis  Arthralgia Transient ischemic attack  Lumbar spine pain Osteopenia  Metatarsalgia Cerebral infarction   Carpal tunnel syndrome Brachial plexus disorders  Trigger finger Malignant neoplasm (HCC)  Bradycardia Asymmetrical hearing loss  Atypical chest pain Stroke  HOH (hard of hearing) GERD (gastroesophageal reflux disease) [name]  Dysarthria and anarthria  Secondary parkinsonism  Cognitive communication deficit  [name]  Syncope  Grade I diastolic dysfunction SVT (supraventricular tachycardia)     Reproductive/Obstetrics negative OB ROS                             Anesthesia Physical Anesthesia Plan  ASA: 3  Anesthesia Plan: MAC   Post-op Pain Management:    Induction: Intravenous  PONV Risk Score and Plan:   Airway Management Planned: Natural Airway and Nasal Cannula  Additional Equipment:   Intra-op Plan:   Post-operative Plan:  Informed Consent: I have reviewed the patients History and Physical, chart, labs and discussed the procedure including the risks, benefits and alternatives for the proposed anesthesia with the patient or authorized representative who has indicated his/her understanding and acceptance.     Dental Advisory Given  Plan Discussed with: Anesthesiologist, CRNA and Surgeon  Anesthesia Plan Comments: (Patient consented for risks of anesthesia including but not limited to:  -  adverse reactions to medications - damage to eyes, teeth, lips or other oral mucosa - nerve damage due to positioning  - sore throat or hoarseness - Damage to heart, brain, nerves, lungs, other parts of body or loss of life  Patient voiced understanding and assent.)        Anesthesia Quick Evaluation

## 2024-04-04 ENCOUNTER — Ambulatory Visit: Admitting: Physical Therapy

## 2024-04-04 ENCOUNTER — Encounter: Payer: Self-pay | Admitting: Physical Therapy

## 2024-04-04 DIAGNOSIS — R2689 Other abnormalities of gait and mobility: Secondary | ICD-10-CM

## 2024-04-04 DIAGNOSIS — R262 Difficulty in walking, not elsewhere classified: Secondary | ICD-10-CM

## 2024-04-04 DIAGNOSIS — R2681 Unsteadiness on feet: Secondary | ICD-10-CM | POA: Diagnosis not present

## 2024-04-04 DIAGNOSIS — M6281 Muscle weakness (generalized): Secondary | ICD-10-CM

## 2024-04-04 NOTE — Therapy (Signed)
 OUTPATIENT PHYSICAL THERAPY BALANCE TREATMENT  Patient Name: Roger Sanchez MRN: [id number] DOB:November 10, 1945, 78 y.o., male Today's Date: 04/04/2024   PT End of Session - 04/04/24 0728     Visit Number 4    Number of Visits 12    Date for PT Re-Evaluation 05/07/24    PT Start Time 0722    PT Stop Time 0809    PT Time Calculation (min) 47 min    Equipment Utilized During Treatment Gait belt    Activity Tolerance Patient tolerated treatment well    Behavior During Therapy WFL for tasks assessed/performed            Past Medical History:  Diagnosis Date   Arthralgia    shoulder    Asthma    Asymmetrical hearing loss    Atypical chest pain    Brachial plexus disorders    Bradycardia    Carpal tunnel syndrome    Cerebral infarction (HCC)    Closed fracture of wrist    Cognitive communication deficit    Dysarthria and anarthria    Exposure to Agent Orange    GERD (gastroesophageal reflux disease)    Grade I diastolic dysfunction    HOH (hard of hearing)    bilateral   Hyperlipidemia    Hypertrophy of prostate    benign    [name] (HCC)    [name] (HCC)    Low back pain    Lumbar spine pain    compression fracture    Malignant neoplasm (HCC)    skin    Metatarsalgia    Osteopenia    Plantar fibromatosis    Secondary parkinsonism (HCC)    Stroke (HCC) 78 yrs old   no residual effects   SVT (supraventricular tachycardia) (HCC)    Syncope    Transient ischemic attack    Trigger finger    Past Surgical History:  Procedure Laterality Date   BACK SURGERY     BROW LIFT Bilateral 03/26/2021   Procedure: BLEPHAROPLASTY UPPER EYELID; W/EXCESS SKIN BROW PTOSIS REPAIR  BLEPHAROPTOSIS REPAIR; RESECT EX BILATERAL;  Surgeon: Roger Hermann, MD;  Location: [facility];  Service: Ophthalmology;  Laterality: Bilateral;   LOOP RECORDER REMOVAL N/A 06/06/2023   Procedure: LOOP RECORDER REMOVAL;  Surgeon: Roger Goodwill, MD;  Location: [facility]  INVASIVE CV LAB;  Service: Cardiovascular;  Laterality: N/A;   PACEMAKER IMPLANT N/A 06/06/2023   Procedure: PACEMAKER IMPLANT;  Surgeon: Roger Goodwill, MD;  Location: [facility] INVASIVE CV LAB;  Service: Cardiovascular;  Laterality: N/A;   WRIST SURGERY Right    Patient Active Problem List   Diagnosis Date Noted   Lightheadedness 05/08/2023   Implantable loop recorder present 03/31/2023   NSVT (nonsustained ventricular tachycardia) (HCC) 02/13/2023   SVT (supraventricular tachycardia) - non sustained 02/13/2023   Near syncope 12/13/2022   [name] (HCC) 12/13/2022   Sinus bradycardia 12/13/2022   History of CVA (cerebrovascular accident) 12/13/2022   Hyperlipidemia 12/13/2022   Dyspnea on exertion 12/13/2022    PCP: Roger Church, MD  Referring Provider: Carolanne Church, MD  REFERRING PROVIDER:   REFERRING DIAGNOSIS:  Z74.09 (ICD-10-CM) - Declining mobility   THERAPY DIAG: Unsteadiness on feet  Other abnormalities of gait and mobility  Difficulty in walking, not elsewhere classified  Muscle weakness (generalized)  ONSET DATE: chronic  FOLLOW UP APPT WITH PROVIDER: PRN   RATIONALE FOR EVALUATION AND TREATMENT: Rehabilitation   SUBJECTIVE:  Chief Complaint: [name] is a 78 year old male with Hx of [name] and Parkinsonism.  Pt. Reports difficulty with stairs and recent falls reported.  Pt. States he is not walking as much and was walking 5 miles/day.    Pertinent History Pt is a 78 year old male with Hx of Lewy Body Demantia and Parkinsonism. Past medical history is significant for [name], TIA, CVA in 2013, asymmetrical hearing loss, dypsnea on exertion, anxiety, HLD, asthma. Pt wants to walk up to 5 miles per day; he walks 2-3 miles presently (pt walks  around neighborhood streets mostly). Patient reports he did use walking stick, but using it has irritated his back (known arthritis per patient). Patient has diplopia and reports blurry vision at a distance. Pt likes to walk in woods and recently limited with descending stairs.    Pain: Yes; comorbid back pain/OA Numbness/Tingling: No Focal Weakness: No. Recent changes in overall health/medication: Yes; steadily worsening [name]  Prior history of physical therapy for balance:  Yes; see PT notes  Falls: Has patient fallen in last 6 months? Yes, Number of falls: 2 - fell on stairs in garage about 1 month ago/ fell the other day while putting pants on "got tangled up" Directional pattern for falls: Yes; falling forward when going down stairs and when he tripped one year ago   Imaging: Yes ;  CLINICAL DATA:  Transient ischemic attack (TIA). Neuro deficit, acute, stroke suspected.   Prior level of function: Independent Occupational demands: Retired Presenter, broadcasting: Walking routine; pt used to enjoy fishing   Red flags (bowel/bladder changes, saddle paresthesia, personal history of cancer, h/o spinal tumors, h/o compression fx, h/o abdominal aneurysm, abdominal pain, chills/fever, night sweats, nausea, vomiting, unrelenting pain): Negative  Precautions: Fall history, fall risk; pt has pacemaker  Weight Bearing Restrictions: No  Living Environment Lives with: lives with their spouse Lives in: House/apartment; 4-5 steps with rail on R side going up.  lives in house 4-5 STE with rail remodeled bathroom with seat available (does not use) 1 level with bonus room - 12 steps with rail (where his bedroom is located) grandchildren local- enjoys playing "horse" basketball with them   Patient Goals: Maintain his level of function/independence    OBJECTIVE:   Patient Surveys  Berg: 54/56 ABC: 57% LEFS: 34 out of 80  Cognition Patient is oriented to person, place, and time.  Recent  memory is intact.  Remote memory is moderately impaired.  Attention span and concentration are intact.  Expressive speech is intact, hypophonic.  Benefits from tx. In a quiet space Patient's fund of knowledge is within normal limits for educational level.    Gross Musculoskeletal Assessment/Observation Tremor: No resting tremor/intention tremor Bulk: Normal Tone: Hypotonic  Mask face, hypophonia.   GAIT: Distance walked: 470 ft  Assistive device utilized: None Level of assistance: SBA Comments: Good heel strike and velocity/cadence. Absent arm swing and rigid trunk. Ambulate outside on varying terrain/ parking lot.  Ascending/descending stairs  Posture: Rounded shoulder, forward head, trunk remains slightly flexed in sitting/standing    LE MMT:  MMT (out of 5) Right 03/26/2024 Left 03/26/2024  Hip flexion 5 5  Hip extension    Hip abduction (seated) 5 5  Hip adduction (seated) 5 5  Hip internal rotation    Hip external rotation    Knee flexion 5 5  Knee extension 5 5  Ankle dorsiflexion 5 5  Ankle plantarflexion    Ankle inversion    Ankle eversion    (* =  pain; Blank rows = not tested)   Sensation Grossly intact to light touch bilateral LEs as determined by testing dermatomes L2-S2. Proprioception, and hot/cold testing deferred on this date.  Reflexes Deferred  Cranial Nerves Visual acuity and visual fields are intact  Extraocular muscles are intact  -pt exhibits convergence insufficiency  Facial sensation is intact bilaterally  Facial strength is intact bilaterally  Hearing loss is mild as tested by gross conversation Palate elevates midline, normal phonation  Shoulder shrug strength is intact  Tongue protrudes slightly L    Coordination/Cerebellar Finger to Nose: Good Heel to Shin: Good Finger Opposition: Good   FUNCTIONAL OUTCOME MEASURES   Results Comments  BERG  Low fall risk          TUG NT   5TSTS 8.2 seconds Slight backward LOB during  5th rep, pt   (Blank rows = not tested)   FUNCTIONAL TASKS  Stairs: pt demonstrates reciprocal stair ascent with no handrail support, no loss of balance and sound toe clearance with each successive step Sit to stand: pt performs readily with no upper limb support, intermittently pushes posterior thighs on chair to assist and has one incident of mild posterior LOB upon attaining standing position   TODAY'S TREATMENT  04/04/2024  Subjective:  Pt. scheduled for cataract surgery next Thursday and states EKG went well.   No c/o pain.  No LOB or falls since last PT tx. Session.     Therapeutic Exercise:    Nustep L5 B UE/LE 12 min. (Discussed daily activity/ upcoming Cataract surgery).      Seated 5#: LAQ/ marching 20x.  Standing heel raises 20x in //-bars. No UE assist needed.      Standing 3-way hip ex. (5# ankle wts)- cuing to no use UE to improve balance.  20x each.    Reviewed HEP  There.act.:  Walking in //-bars (5# ankle wts.):  6"/12" hurdles with focus on hip flexion/ preventing circumduction.  Light UE assist required initially and progressing to no UE assist with recip. Gait pattern.    Walking in hallway 6 laps (increase cadence)- added alt. UE/LE touches.  2 laps with cuing to increase recip. arm swing.    Walking in //-bars: Airex tandem pad step ups/down 5 laps.  Lateral walking with Airex tandem pad 5 laps.    Resisted gait 2BTB 7x all 4-planes in //-bars with no UE assist.  Improved BOS  Walking outside on stairs with focus on heel clearance.  Ascending/descending stairs with recip. Pattern and no UE assist.  SBA for cuing/ safety.     NOT TODAY: Star ex.: Blaze pods (6 pods)- SLS with pod touches 1 min. On L/R x 2.  Average 34 taps.    Blaze pod step touches (1st/2nd/3rd step).  Random taps 1 min. X 2 each on L/R.    Walking in grassy terrain/ up and down curbs 5x.    Amb. In //-bars with 12" hurdle clearance.  Forward/ lateral with no UE assist.  Challenged  more with lateral steps as compared to forward step overs.    STS from gray chair with no UE assist and added lunges with B shoulder flexion 10x on L/R.  No LOB and pt. Instructed to slow down.    BOSU step ups in //-bars with light UE and progressing to no UE assist.  Good ankle/LE control and pts. Confidence improved after several reps.     PATIENT EDUCATION:  Education details: see above for patient education details  Person educated: Patient Education method: Explanation Education comprehension: verbalized understanding   HOME EXERCISE PROGRAM: Reviewed currently HEP   ASSESSMENT:  CLINICAL IMPRESSION: Pt highly motivated and works hard during tx. Session.  Primary focus on higher level balance and safety managing stairs.  Pt. Demonstrates improved technique/ hip flexion with hurdle clearance while using 5# ankle wts.   No LOB during tx. Session but extra time with tandem Airex walking.   Patient will benefit from skilled PT to address above impairments and improve overall function.  REHAB POTENTIAL: Good  CLINICAL DECISION MAKING: Unstable/unpredictable  EVALUATION COMPLEXITY: High   GOALS: Goals reviewed with patient? Yes  SHORT TERM GOALS: Target date: 04/16/2024  Pt will be independent with HEP in order to improve strength and balance in order to decrease fall risk and improve function at home. Baseline: initial eval: Formal HEP to be initiated at second follow-up visit.  Goal status: INITIAL   LONG TERM GOALS: Target date: 05/07/2024  Pt will increase LEFS to >50 out of 80 to improve functional mobility/ decrease fall risk.        Baseline: 34 out of 80 Goal status: INITIAL  2.  Pt will increase ABC to >70% to improve confidence/ independence with walking and descending stairs.        Baseline: 57% Goal status: INITIAL  3.  Pt. Able to descend stairs with reciprocal pattern and consistent clearance of heel to decrease fall risk.        Baseline: recent fall  descending stairs.  Extra time to complete/ consistently clear heel.        Goal status: INITIAL   PLAN: PT FREQUENCY: 2x/week  PT DURATION: 6 weeks  PLANNED INTERVENTIONS: Therapeutic exercises, Therapeutic activity, Neuromuscular re-education, Balance training, Gait training, Patient/Family education, Joint manipulation, Joint mobilization, Canalith repositioning, Aquatic Therapy, Dry Needling, Cognitive remediation, Electrical stimulation, Spinal manipulation, Spinal mobilization, Cryotherapy, Moist heat, Traction, Ultrasound, Ionotophoresis 4mg /ml Dexamethasone, and Manual therapy  PLAN FOR NEXT SESSION: Continue with interventions to promote arm-swing and large-amplitude transfers and stepping drills. Advance HEP with successive PT visits.    Lendell Quarry, PT, DPT # [phone] 04/04/2024, 8:12 AM

## 2024-04-09 ENCOUNTER — Ambulatory Visit: Attending: Family Medicine | Admitting: Physical Therapy

## 2024-04-09 ENCOUNTER — Encounter: Payer: Self-pay | Admitting: Physical Therapy

## 2024-04-09 DIAGNOSIS — R262 Difficulty in walking, not elsewhere classified: Secondary | ICD-10-CM | POA: Diagnosis present

## 2024-04-09 DIAGNOSIS — R2689 Other abnormalities of gait and mobility: Secondary | ICD-10-CM | POA: Insufficient documentation

## 2024-04-09 DIAGNOSIS — M6281 Muscle weakness (generalized): Secondary | ICD-10-CM | POA: Insufficient documentation

## 2024-04-09 DIAGNOSIS — R2681 Unsteadiness on feet: Secondary | ICD-10-CM | POA: Insufficient documentation

## 2024-04-09 NOTE — Discharge Instructions (Signed)

## 2024-04-09 NOTE — Therapy (Signed)
 OUTPATIENT PHYSICAL THERAPY BALANCE TREATMENT  Patient Name: Roger Sanchez MRN: [id number] DOB:[birth year], 78 y.o., male Today's Date: 04/09/2024   PT End of Session - 04/09/24 0714     Visit Number 5    Number of Visits 12    Date for PT Re-Evaluation 05/07/24    PT Start Time 0721    PT Stop Time 0818    PT Time Calculation (min) 57 min    Equipment Utilized During Treatment Gait belt    Activity Tolerance Patient tolerated treatment well    Behavior During Therapy WFL for tasks assessed/performed            Past Medical History:  Diagnosis Date   Arthralgia    shoulder    Asthma    Asymmetrical hearing loss    Atypical chest pain    Brachial plexus disorders    Bradycardia    Carpal tunnel syndrome    Cerebral infarction (HCC)    Closed fracture of wrist    Cognitive communication deficit    Dysarthria and anarthria    Exposure to Agent Orange    GERD (gastroesophageal reflux disease)    Grade I diastolic dysfunction    HOH (hard of hearing)    bilateral   Hyperlipidemia    Hypertrophy of prostate    benign    [name] (HCC)    [name] (HCC)    Low back pain    Lumbar spine pain    compression fracture    Malignant neoplasm (HCC)    skin    Metatarsalgia    Osteopenia    Plantar fibromatosis    Secondary parkinsonism (HCC)    Stroke (HCC) 78 yrs old   no residual effects   SVT (supraventricular tachycardia) (HCC)    Syncope    Transient ischemic attack    Trigger finger    Past Surgical History:  Procedure Laterality Date   BACK SURGERY     BROW LIFT Bilateral 03/26/2021   Procedure: BLEPHAROPLASTY UPPER EYELID; W/EXCESS SKIN BROW PTOSIS REPAIR  BLEPHAROPTOSIS REPAIR; RESECT EX BILATERAL;  Surgeon: Zacarias Hermann, MD;  Location: [facility];  Service: Ophthalmology;  Laterality: Bilateral;   LOOP RECORDER REMOVAL N/A 06/06/2023   Procedure: LOOP RECORDER REMOVAL;  Surgeon: Verona Goodwill, MD;  Location: [facility]  INVASIVE CV LAB;  Service: Cardiovascular;  Laterality: N/A;   PACEMAKER IMPLANT N/A 06/06/2023   Procedure: PACEMAKER IMPLANT;  Surgeon: Verona Goodwill, MD;  Location: [facility] INVASIVE CV LAB;  Service: Cardiovascular;  Laterality: N/A;   WRIST SURGERY Right    Patient Active Problem List   Diagnosis Date Noted   Lightheadedness 05/08/2023   Implantable loop recorder present 03/31/2023   NSVT (nonsustained ventricular tachycardia) (HCC) 02/13/2023   SVT (supraventricular tachycardia) - non sustained 02/13/2023   Near syncope 12/13/2022   [name] (HCC) 12/13/2022   Sinus bradycardia 12/13/2022   History of CVA (cerebrovascular accident) 12/13/2022   Hyperlipidemia 12/13/2022   Dyspnea on exertion 12/13/2022    PCP: Carolanne Church, MD  Referring Provider: Carolanne Church, MD  REFERRING PROVIDER:   REFERRING DIAGNOSIS:  Z74.09 (ICD-10-CM) - Declining mobility   THERAPY DIAG: Unsteadiness on feet  Other abnormalities of gait and mobility  Difficulty in walking, not elsewhere classified  Muscle weakness (generalized)  ONSET DATE: chronic  FOLLOW UP APPT WITH PROVIDER: PRN   RATIONALE FOR EVALUATION AND TREATMENT: Rehabilitation   SUBJECTIVE:  Chief Complaint: [name] is a 78 year old male with Hx of [name] and Parkinsonism.  Pt. Reports difficulty with stairs and recent falls reported.  Pt. States he is not walking as much and was walking 5 miles/day.    Pertinent History Pt is a 78 year old male with Hx of Lewy Body Demantia and Parkinsonism. Past medical history is significant for [name], TIA, CVA in 2013, asymmetrical hearing loss, dypsnea on exertion, anxiety, HLD, asthma. Pt wants to walk up to 5 miles per day; he walks 2-3 miles presently (pt walks  around neighborhood streets mostly). Patient reports he did use walking stick, but using it has irritated his back (known arthritis per patient). Patient has diplopia and reports blurry vision at a distance. Pt likes to walk in woods and recently limited with descending stairs.    Pain: Yes; comorbid back pain/OA Numbness/Tingling: No Focal Weakness: No. Recent changes in overall health/medication: Yes; steadily worsening [name]  Prior history of physical therapy for balance:  Yes; see PT notes  Falls: Has patient fallen in last 6 months? Yes, Number of falls: 2 - fell on stairs in garage about 1 month ago/ fell the other day while putting pants on "got tangled up" Directional pattern for falls: Yes; falling forward when going down stairs and when he tripped one year ago   Imaging: Yes ;  CLINICAL DATA:  Transient ischemic attack (TIA). Neuro deficit, acute, stroke suspected.   Prior level of function: Independent Occupational demands: Retired Presenter, broadcasting: Walking routine; pt used to enjoy fishing   Red flags (bowel/bladder changes, saddle paresthesia, personal history of cancer, h/o spinal tumors, h/o compression fx, h/o abdominal aneurysm, abdominal pain, chills/fever, night sweats, nausea, vomiting, unrelenting pain): Negative  Precautions: Fall history, fall risk; pt has pacemaker  Weight Bearing Restrictions: No  Living Environment Lives with: lives with their spouse Lives in: House/apartment; 4-5 steps with rail on R side going up.  lives in house 4-5 STE with rail remodeled bathroom with seat available (does not use) 1 level with bonus room - 12 steps with rail (where his bedroom is located) grandchildren local- enjoys playing "horse" basketball with them   Patient Goals: Maintain his level of function/independence    OBJECTIVE:   Patient Surveys  Berg: 54/56 ABC: 57% LEFS: 34 out of 80  Cognition Patient is oriented to person, place, and time.  Recent  memory is intact.  Remote memory is moderately impaired.  Attention span and concentration are intact.  Expressive speech is intact, hypophonic.  Benefits from tx. In a quiet space Patient's fund of knowledge is within normal limits for educational level.    Gross Musculoskeletal Assessment/Observation Tremor: No resting tremor/intention tremor Bulk: Normal Tone: Hypotonic  Mask face, hypophonia.   GAIT: Distance walked: 470 ft  Assistive device utilized: None Level of assistance: SBA Comments: Good heel strike and velocity/cadence. Absent arm swing and rigid trunk. Ambulate outside on varying terrain/ parking lot.  Ascending/descending stairs  Posture: Rounded shoulder, forward head, trunk remains slightly flexed in sitting/standing    LE MMT:  MMT (out of 5) Right 03/26/2024 Left 03/26/2024  Hip flexion 5 5  Hip extension    Hip abduction (seated) 5 5  Hip adduction (seated) 5 5  Hip internal rotation    Hip external rotation    Knee flexion 5 5  Knee extension 5 5  Ankle dorsiflexion 5 5  Ankle plantarflexion    Ankle inversion    Ankle eversion    (* =  pain; Blank rows = not tested)   Sensation Grossly intact to light touch bilateral LEs as determined by testing dermatomes L2-S2. Proprioception, and hot/cold testing deferred on this date.  Reflexes Deferred  Cranial Nerves Visual acuity and visual fields are intact  Extraocular muscles are intact  -pt exhibits convergence insufficiency  Facial sensation is intact bilaterally  Facial strength is intact bilaterally  Hearing loss is mild as tested by gross conversation Palate elevates midline, normal phonation  Shoulder shrug strength is intact  Tongue protrudes slightly L    Coordination/Cerebellar Finger to Nose: Good Heel to Shin: Good Finger Opposition: Good   FUNCTIONAL OUTCOME MEASURES   Results Comments  BERG  Low fall risk          TUG NT   5TSTS 8.2 seconds Slight backward LOB during  5th rep, pt   (Blank rows = not tested)   FUNCTIONAL TASKS  Stairs: pt demonstrates reciprocal stair ascent with no handrail support, no loss of balance and sound toe clearance with each successive step Sit to stand: pt performs readily with no upper limb support, intermittently pushes posterior thighs on chair to assist and has one incident of mild posterior LOB upon attaining standing position   TODAY'S TREATMENT  04/09/2024  Subjective:  Pt. scheduled for cataract surgery this Thursday.  Pt. states he had dark urine with blood in it after last tx. Session and went to ER with dx of kidney stones.  Pt. States he experienced hallucinations while sleeping Friday night and while walking down steps he tripped on the last 2 steps and fell.  While walking back up stairs, pt. Fell on last 2 steps going up (no injury reported).  No c/o back pain or symptoms from kidney stones at this time.    Therapeutic Exercise:  No charge  Nustep L5 B UE/LE 12 min. (Discussed daily activity/ recent fall on stairs and hallucinations at night).   BP:  102/60.  HR: 70, O2 sat. 100%  There.act.:  Large amplitude movements: forward/ lateral lunges, rocking forward and backwards with added shoulder flexion, Sit to stand with no UE assist (reach down to floor and then stand), step back with added DF on opposite leg, standing full body rotations to both sides with reaching.   33 min.   Walking in clinic with exaggerated arm swing.    Stair training with emphasis on larger steps and heel strike when descending.  Used 1 handrail for safety.   NOT TODAY: Walking in //-bars (5# ankle wts.):  6"/12" hurdles with focus on hip flexion/ preventing circumduction.  Light UE assist required initially and progressing to no UE assist with recip. Gait pattern.    Walking in hallway 6 laps (increase cadence)- added alt. UE/LE touches.  2 laps with cuing to increase recip. arm swing.    Walking in //-bars: Airex tandem pad step  ups/down 5 laps.  Lateral walking with Airex tandem pad 5 laps.    Resisted gait 2BTB 7x all 4-planes in //-bars with no UE assist.  Improved BOS  Walking outside on stairs with focus on heel clearance.  Ascending/descending stairs with recip. Pattern and no UE assist.  SBA for cuing/ safety.    Star ex.: Blaze pods (6 pods)- SLS with pod touches 1 min. On L/R x 2.  Average 34 taps.    Blaze pod step touches (1st/2nd/3rd step).  Random taps 1 min. X 2 each on L/R.    Walking in grassy terrain/ up and  down curbs 5x.    Amb. In //-bars with 12" hurdle clearance.  Forward/ lateral with no UE assist.  Challenged more with lateral steps as compared to forward step overs.    STS from gray chair with no UE assist and added lunges with B shoulder flexion 10x on L/R.  No LOB and pt. Instructed to slow down.    BOSU step ups in //-bars with light UE and progressing to no UE assist.  Good ankle/LE control and pts. Confidence improved after several reps.   Seated 5#: LAQ/ marching 20x.  Standing heel raises 20x in //-bars. No UE assist needed.   Standing 3-way hip ex. (5# ankle wts)- cuing to no use UE to improve balance.  20x each.      PATIENT EDUCATION:  Education details: see above for patient education details Person educated: Patient Education method: Explanation Education comprehension: verbalized understanding   HOME EXERCISE PROGRAM: Reviewed currently HEP   ASSESSMENT:  CLINICAL IMPRESSION: Pt highly motivated and works hard during tx. Session.  Primary focus on higher level balance and safety managing stairs.  Pt demonstrates good coach ability with large amplitude movements, requiring occasional verbal and tactile cues to improve form and perform the exercises correctly. Pt had occasional mild LOB during large amplitude exercises, was able to self-correct without PT intervention. Pt  demonstrates good strength and power with STS from clinic chair, though he reports having  difficulty rising from his recliner.  On stairs, pt demonstrates good balance and strength with ascending and descending, though he has difficulty with depth perception when ascending.   REHAB POTENTIAL: Good  CLINICAL DECISION MAKING: Unstable/unpredictable  EVALUATION COMPLEXITY: High   GOALS: Goals reviewed with patient? Yes  SHORT TERM GOALS: Target date: 04/16/2024  Pt will be independent with HEP in order to improve strength and balance in order to decrease fall risk and improve function at home. Baseline: initial eval: Formal HEP to be initiated at second follow-up visit.  Goal status: INITIAL   LONG TERM GOALS: Target date: 05/07/2024  Pt will increase LEFS to >50 out of 80 to improve functional mobility/ decrease fall risk.        Baseline: 34 out of 80 Goal status: INITIAL  2.  Pt will increase ABC to >70% to improve confidence/ independence with walking and descending stairs.        Baseline: 57% Goal status: INITIAL  3.  Pt. Able to descend stairs with reciprocal pattern and consistent clearance of heel to decrease fall risk.        Baseline: recent fall descending stairs.  Extra time to complete/ consistently clear heel.        Goal status: INITIAL   PLAN: PT FREQUENCY: 2x/week  PT DURATION: 6 weeks  PLANNED INTERVENTIONS: Therapeutic exercises, Therapeutic activity, Neuromuscular re-education, Balance training, Gait training, Patient/Family education, Joint manipulation, Joint mobilization, Canalith repositioning, Aquatic Therapy, Dry Needling, Cognitive remediation, Electrical stimulation, Spinal manipulation, Spinal mobilization, Cryotherapy, Moist heat, Traction, Ultrasound, Ionotophoresis 4mg /ml Dexamethasone, and Manual therapy  PLAN FOR NEXT SESSION: Discuss cataract surgery/ reassess goals.     Lendell Quarry, PT, DPT # [phone] 04/09/2024, 8:47 AM

## 2024-04-11 ENCOUNTER — Ambulatory Visit: Payer: Self-pay | Admitting: Anesthesiology

## 2024-04-11 ENCOUNTER — Other Ambulatory Visit: Payer: Self-pay

## 2024-04-11 ENCOUNTER — Ambulatory Visit
Admission: RE | Admit: 2024-04-11 | Discharge: 2024-04-11 | Disposition: A | Discharge status: Home / Self Care | Source: Ambulatory Visit | Attending: Ophthalmology | Admitting: Ophthalmology

## 2024-04-11 ENCOUNTER — Encounter: Payer: Self-pay | Admitting: Ophthalmology

## 2024-04-11 ENCOUNTER — Encounter: Admitting: Physical Therapy

## 2024-04-11 ENCOUNTER — Encounter
Admission: RE | Disposition: A | Discharge status: Home / Self Care | Payer: Self-pay | Source: Ambulatory Visit | Attending: Ophthalmology

## 2024-04-11 DIAGNOSIS — J45909 Unspecified asthma, uncomplicated: Secondary | ICD-10-CM | POA: Diagnosis not present

## 2024-04-11 DIAGNOSIS — F0280 Dementia in other diseases classified elsewhere without behavioral disturbance: Secondary | ICD-10-CM | POA: Insufficient documentation

## 2024-04-11 DIAGNOSIS — H2512 Age-related nuclear cataract, left eye: Secondary | ICD-10-CM | POA: Insufficient documentation

## 2024-04-11 DIAGNOSIS — H25012 Cortical age-related cataract, left eye: Secondary | ICD-10-CM | POA: Diagnosis present

## 2024-04-11 HISTORY — DX: Syncope and collapse: R55

## 2024-04-11 HISTORY — DX: Other ill-defined heart diseases: I51.89

## 2024-04-11 HISTORY — DX: Secondary parkinsonism, unspecified: G21.9

## 2024-04-11 HISTORY — DX: Cognitive communication deficit: R41.841

## 2024-04-11 HISTORY — DX: Supraventricular tachycardia, unspecified: I47.10

## 2024-04-11 HISTORY — DX: Dysarthria and anarthria: R47.1

## 2024-04-11 SURGERY — PHACOEMULSIFICATION, CATARACT, WITH IOL INSERTION
Anesthesia: Monitor Anesthesia Care | Site: Eye | Laterality: Left

## 2024-04-11 MED ORDER — SIGHTPATH DOSE#1 NA HYALUR & NA CHOND-NA HYALUR IO KIT
PACK | INTRAOCULAR | Status: DC | PRN
  Administered 2024-04-11: 1 via OPHTHALMIC

## 2024-04-11 MED ORDER — LIDOCAINE HCL (PF) 2 % IJ SOLN
INTRAOCULAR | Status: DC | PRN
  Administered 2024-04-11: 1 mL via INTRAOCULAR

## 2024-04-11 MED ORDER — SIGHTPATH DOSE#1 BSS IO SOLN
INTRAOCULAR | Status: DC | PRN
  Administered 2024-04-11: 15 mL

## 2024-04-11 MED ORDER — ARMC OPHTHALMIC DILATING DROPS
OPHTHALMIC | Status: AC
  Filled 2024-04-11: qty 0.5

## 2024-04-11 MED ORDER — PHENYLEPHRINE-KETOROLAC 1-0.3 % IO SOLN
INTRAOCULAR | Status: DC | PRN
  Administered 2024-04-11: 86 mL via OPHTHALMIC

## 2024-04-11 MED ORDER — TETRACAINE HCL 0.5 % OP SOLN
1.0000 [drp] | OPHTHALMIC | Status: DC | PRN
  Administered 2024-04-11 (×3): 1 [drp] via OPHTHALMIC

## 2024-04-11 MED ORDER — ARMC OPHTHALMIC DILATING DROPS
1.0000 | OPHTHALMIC | Status: DC | PRN
  Administered 2024-04-11 (×3): 1 via OPHTHALMIC

## 2024-04-11 MED ORDER — BRIMONIDINE TARTRATE-TIMOLOL 0.2-0.5 % OP SOLN
OPHTHALMIC | Status: DC | PRN
  Administered 2024-04-11: 1 [drp] via OPHTHALMIC

## 2024-04-11 MED ORDER — LACTATED RINGERS IV SOLN: INTRAVENOUS | Status: DC

## 2024-04-11 MED ORDER — MOXIFLOXACIN HCL 0.5 % OP SOLN
OPHTHALMIC | Status: DC | PRN
  Administered 2024-04-11: .2 mL via OPHTHALMIC

## 2024-04-11 MED ORDER — TETRACAINE HCL 0.5 % OP SOLN
OPHTHALMIC | Status: AC
  Filled 2024-04-11: qty 4

## 2024-04-11 SURGICAL SUPPLY — 12 items
CATARACT SUITE SIGHTPATH (MISCELLANEOUS) ×1 IMPLANT
DISSECTOR HYDRO NUCLEUS 50X22 (MISCELLANEOUS) ×1 IMPLANT
DRSG TEGADERM 2-3/8X2-3/4 SM (GAUZE/BANDAGES/DRESSINGS) ×1 IMPLANT
FEE CATARACT SUITE SIGHTPATH (MISCELLANEOUS) ×1 IMPLANT
GLOVE BIOGEL PI IND STRL 8 (GLOVE) ×1 IMPLANT
GLOVE SURG LX STRL 7.5 STRW (GLOVE) ×1 IMPLANT
GLOVE SURG PROTEXIS BL SZ6.5 (GLOVE) ×1 IMPLANT
GLOVE SURG SYN 6.5 PF PI BL (GLOVE) ×1 IMPLANT
LENS IOL TECNIS MONO 21.5 (Intraocular Lens) IMPLANT
NDL FILTER BLUNT 18X1 1/2 (NEEDLE) ×1 IMPLANT
NEEDLE FILTER BLUNT 18X1 1/2 (NEEDLE) ×1 IMPLANT
SYR 3ML LL SCALE MARK (SYRINGE) ×1 IMPLANT

## 2024-04-11 NOTE — Op Note (Signed)
 OPERATIVE NOTE  WENDLE KINA [id number] 04/11/2024   PREOPERATIVE DIAGNOSIS: Nuclear sclerotic cataract left eye. H25.12   POSTOPERATIVE DIAGNOSIS: Nuclear sclerotic cataract left eye. H25.12   PROCEDURE:  Phacoemusification with posterior chamber intraocular lens placement of the left eye  Ultrasound time: Procedure(s): PHACOEMULSIFICATION, CATARACT, WITH IOL INSERTION  6.21  00:46.9 (Left)  LENS:   Implant Name Type Inv. Item Serial No. Manufacturer Lot No. LRB No. Used Action  LENS IOL TECNIS MONO 21.5 - [id number] Intraocular Lens LENS IOL TECNIS MONO 21.5 [id number] [employer]  Left 1 Implanted      SURGEON:  Rosy Cooper. Donalda Fruit, MD   ANESTHESIA:  Topical with tetracaine  drops, augmented with 1% preservative-free intracameral lidocaine .   COMPLICATIONS:  None.   DESCRIPTION OF PROCEDURE:  The patient was identified in the holding room and transported to the operating room and placed in the supine position under the operating microscope.  The left eye was identified as the operative eye, which was prepped and draped in the usual sterile ophthalmic fashion.   A 1 millimeter clear-corneal paracentesis was made inferotemporally. Preservative-free 1% lidocaine  mixed with 1:1,000 bisulfite-free aqueous solution of epinephrine  was injected into the anterior chamber. The anterior chamber was then filled with Viscoat viscoelastic. A 2.4 millimeter keratome was used to make a clear-corneal incision superotemporally. A curvilinear capsulorrhexis was made with a cystotome and capsulorrhexis forceps. Balanced salt solution was used to hydrodissect and hydrodelineate the nucleus. Phacoemulsification was then used to remove the lens nucleus and epinucleus. The remaining cortex was then removed using the irrigation and aspiration handpiece. Provisc was then placed into the capsular bag to distend it for lens placement. A +21.50 D [id number] intraocular lens was then injected into the capsular bag. The  remaining viscoelastic was aspirated.   Wounds were hydrated with balanced salt solution.  The anterior chamber was inflated to a physiologic pressure with balanced salt solution.  No wound leaks were noted. Moxifloxacin was injected intracamerally.  Timolol and Brimonidine drops were applied to the eye.  The patient was taken to the recovery room in stable condition without complications of anesthesia or surgery.  Meryl Acosta [place] 04/11/2024, 10:31 AM

## 2024-04-11 NOTE — CV Procedure (Signed)
  Device system confirmed to be MRI conditional, with implant date > 6 weeks ago, and no evidence of abandoned or epicardial leads in review of most recent CXR  Device last cleared by EP Provider: Suzann Riddle   Clearance is good through for 1 year as long as parameters remain stable at time of check. If pt undergoes a cardiac device procedure during that time, they should be re-cleared.   Tachy-therapies to be programmed off if applicable with device back to pre-MRI settings after completion of exam.  [employer] - Programming recommendation received through [employer] App/Tablet  Arlys Lamer, RT  04/11/2024 3:41 PM

## 2024-04-11 NOTE — Anesthesia Postprocedure Evaluation (Signed)
 Anesthesia Post Note  Patient: Roger Sanchez  Procedure(s) Performed: PHACOEMULSIFICATION, CATARACT, WITH IOL INSERTION  6.21  00:46.9 (Left: Eye)  Patient location during evaluation: PACU Anesthesia Type: MAC Level of consciousness: awake and alert Pain management: pain level controlled Vital Signs Assessment: post-procedure vital signs reviewed and stable Respiratory status: spontaneous breathing, nonlabored ventilation, respiratory function stable and patient connected to nasal cannula oxygen Cardiovascular status: stable and blood pressure returned to baseline Postop Assessment: no apparent nausea or vomiting Anesthetic complications: no   No notable events documented.   Last Vitals:  Vitals:   04/11/24 1032 04/11/24 1038  BP: 132/72 (!) 121/95  Pulse: 62 60  Resp: 14 18  Temp: (!) 36.3 C (!) 36.3 C  SpO2: 99% 99%    Last Pain:  Vitals:   04/11/24 1038  PainSc: 0-No pain                 Edouard Gikas C Tallia Moehring

## 2024-04-11 NOTE — H&P (Signed)
 [facility]   Primary Care Physician:  Carolanne Church, MD Ophthalmologist: Dr. Meg Spina  Pre-Procedure History & Physical: HPI:  Roger Sanchez is a 78 y.o. male here for cataract surgery.   Past Medical History:  Diagnosis Date   Arthralgia    shoulder    Asthma    Asymmetrical hearing loss    Atypical chest pain    Brachial plexus disorders    Bradycardia    Carpal tunnel syndrome    Cerebral infarction (HCC)    Closed fracture of wrist    Cognitive communication deficit    Dysarthria and anarthria    Exposure to Agent Orange    GERD (gastroesophageal reflux disease)    Grade I diastolic dysfunction    HOH (hard of hearing)    bilateral   Hyperlipidemia    Hypertrophy of prostate    benign    [name] (HCC)    [name] (HCC)    Low back pain    Lumbar spine pain    compression fracture    Malignant neoplasm (HCC)    skin    Metatarsalgia    Osteopenia    Plantar fibromatosis    Secondary parkinsonism (HCC)    Stroke (HCC) 78 yrs old   no residual effects   SVT (supraventricular tachycardia) (HCC)    Syncope    Transient ischemic attack    Trigger finger     Past Surgical History:  Procedure Laterality Date   BACK SURGERY     BROW LIFT Bilateral 03/26/2021   Procedure: BLEPHAROPLASTY UPPER EYELID; W/EXCESS SKIN BROW PTOSIS REPAIR  BLEPHAROPTOSIS REPAIR; RESECT EX BILATERAL;  Surgeon: Zacarias Hermann, MD;  Location: [facility];  Service: Ophthalmology;  Laterality: Bilateral;   LOOP RECORDER REMOVAL N/A 06/06/2023   Procedure: LOOP RECORDER REMOVAL;  Surgeon: Verona Goodwill, MD;  Location: [facility] INVASIVE CV LAB;  Service: Cardiovascular;  Laterality: N/A;   PACEMAKER IMPLANT N/A 06/06/2023   Procedure: PACEMAKER IMPLANT;  Surgeon: Verona Goodwill, MD;  Location: [facility] INVASIVE CV LAB;  Service: Cardiovascular;  Laterality: N/A;   WRIST SURGERY Right     Prior to Admission medications   Medication Sig Start Date End  Date Taking? Authorizing Provider  Albuterol  Sulfate 108 (90 Base) MCG/ACT AEPB Inhale 2 puffs into the lungs QID.    [provider]  ascorbic acid  (VITAMIN C ) 500 MG tablet Take 500 mg by mouth daily.    [provider]  aspirin  EC 81 MG tablet Take 81 mg by mouth daily.    [provider]  atorvastatin  (LIPITOR) 20 MG tablet Take 10 mg by mouth at bedtime.    [provider]  clopidogrel  (PLAVIX ) 75 MG tablet Take 75 mg by mouth daily.    [provider]  clotrimazole (LOTRIMIN) 1 % cream Apply topically. Apply small amount to toe nails at bedtime to treat toenail fungus    [provider]  famotidine  (PEPCID ) 20 MG tablet Take 20 mg by mouth 2 (two) times daily.    [provider]  flecainide  (TAMBOCOR ) 50 MG tablet Take 0.5 tablets (25 mg total) by mouth 2 (two) times daily. 03/14/24   Verona Goodwill, MD  glycopyrrolate  (ROBINUL ) 2 MG tablet Take by mouth. 05/31/23   [provider]  GUAIFENESIN CR PO Take by mouth. prn    [provider]  lidocaine  (XYLOCAINE ) 5 % ointment Apply topically. 08/17/22   [provider]  Multiple Vitamins-Minerals (MULTI-VITAMIN/MINERALS PO) Take by mouth.    [provider]  naproxen (NAPROSYN) 500 MG tablet Take 500 mg by mouth 2 (two) times daily with a meal.    [provider]  pantoprazole  (PROTONIX ) 40 MG tablet Take 40 mg by mouth 2 (two) times daily.    [provider]  tamsulosin  (FLOMAX ) 0.4 MG CAPS capsule Take 0.4 mg by mouth.    [provider]    Allergies as of 03/20/2024 - Review Complete 03/18/2024  Allergen Reaction Noted   Barley grass  11/12/2018   Corn oil Other (See Comments) 03/23/2012   Corn-containing products Other (See Comments) 11/12/2018   Malt Other (See Comments) 11/12/2018   Milk (cow) Other (See Comments) 03/23/2012   Milk-related compounds  11/12/2018   Peanut-containing drug products Other (See  Comments) 03/23/2012   Simvastatin Other (See Comments) 08/02/2004   Wheat  08/02/2004    Family History  Problem Relation Age of Onset   Heart Problems Father    Heart attack Paternal Aunt    Heart attack Paternal Uncle     Social History   Socioeconomic History   Marital status: Married    Spouse name: Not on file   Number of children: Not on file   Years of education: Not on file   Highest education level: Not on file  Occupational History   Not on file  Tobacco Use   Smoking status: Never    Passive exposure: Never   Smokeless tobacco: Never  Vaping Use   Vaping status: Never Used  Substance and Sexual Activity   Alcohol use: Never   Drug use: Never   Sexual activity: Not Currently  Other Topics Concern   Not on file  Social History Narrative   Not on file   Social Drivers of Health   Financial Resource Strain: Low Risk  (02/15/2024)   Received from [facility]   Overall Financial Resource Strain (CARDIA)    Difficulty of Paying Living Expenses: Not hard at all  Food Insecurity: No Food Insecurity (02/15/2024)   Received from [facility]   Hunger Vital Sign    Worried About Running Out of Food in the Last Year: Never true    Ran Out of Food in the Last Year: Never true  Transportation Needs: No Transportation Needs (02/15/2024)   Received from [facility] - Transportation    In the past 12 months, has lack of transportation kept you from medical appointments or from getting medications?: No    Lack of Transportation (Non-Medical): No  Physical Activity: Not on file  Stress: Not on file  Social Connections: Not on file  Intimate Partner Violence: Not At Risk (12/13/2022)   Humiliation, Afraid, Rape, and Kick questionnaire    Fear of Current or Ex-Partner: No    Emotionally Abused: No    Physically Abused: No    Sexually Abused: No    Review of Systems: See HPI, otherwise negative  ROS  Physical Exam: Ht 5\' 6"  (1.676 m)   Wt 72.6 kg   BMI 25.82 kg/m  General:   Alert, cooperative in NAD Head:  Normocephalic and atraumatic. Respiratory:  Normal work of breathing. Cardiovascular:  RRR  Impression/Plan: Roger Sanchez is here for cataract surgery.  Risks, benefits, limitations, and alternatives regarding cataract surgery have been reviewed with the patient.  Questions have been answered.  All parties agreeable.   Trudi Fus, MD  04/11/2024, 7:09 AM

## 2024-04-11 NOTE — Transfer of Care (Signed)
 Immediate Anesthesia Transfer of Care Note  Patient: Roger Sanchez [facility]  Procedure(s) Performed: PHACOEMULSIFICATION, CATARACT, WITH IOL INSERTION  6.21  00:46.9 (Left: Eye)  Patient Location: PACU  Anesthesia Type: MAC  Level of Consciousness: awake, alert  and patient cooperative  Airway and Oxygen Therapy: Patient Spontanous Breathing and Patient connected to supplemental oxygen  Post-op Assessment: Post-op Vital signs reviewed, Patient's Cardiovascular Status Stable, Respiratory Function Stable, Patent Airway and No signs of Nausea or vomiting  Post-op Vital Signs: Reviewed and stable  Complications: No notable events documented.

## 2024-04-12 ENCOUNTER — Encounter: Payer: Self-pay | Admitting: Ophthalmology

## 2024-04-16 ENCOUNTER — Ambulatory Visit: Admitting: Physical Therapy

## 2024-04-16 ENCOUNTER — Encounter: Payer: Self-pay | Admitting: Ophthalmology

## 2024-04-16 ENCOUNTER — Ambulatory Visit (HOSPITAL_COMMUNITY)
Admission: RE | Admit: 2024-04-16 | Discharge: 2024-04-16 | Disposition: A | Discharge status: Home / Self Care | Source: Ambulatory Visit | Attending: Unknown Physician Specialty | Admitting: Unknown Physician Specialty

## 2024-04-16 ENCOUNTER — Encounter (HOSPITAL_COMMUNITY): Payer: Self-pay

## 2024-04-18 ENCOUNTER — Ambulatory Visit: Admitting: Physical Therapy

## 2024-04-19 NOTE — Progress Notes (Signed)
 Remote pacemaker transmission.

## 2024-04-23 ENCOUNTER — Ambulatory Visit: Admitting: Physical Therapy

## 2024-04-23 ENCOUNTER — Encounter: Payer: Self-pay | Admitting: Physical Therapy

## 2024-04-23 DIAGNOSIS — R2681 Unsteadiness on feet: Secondary | ICD-10-CM | POA: Diagnosis not present

## 2024-04-23 DIAGNOSIS — R2689 Other abnormalities of gait and mobility: Secondary | ICD-10-CM

## 2024-04-23 DIAGNOSIS — R262 Difficulty in walking, not elsewhere classified: Secondary | ICD-10-CM

## 2024-04-23 DIAGNOSIS — M6281 Muscle weakness (generalized): Secondary | ICD-10-CM

## 2024-04-23 NOTE — Discharge Instructions (Signed)

## 2024-04-23 NOTE — Therapy (Signed)
 OUTPATIENT PHYSICAL THERAPY BALANCE TREATMENT  Patient Name: Roger Sanchez MRN: [id number] DOB:20-Mar-1946, 78 y.o., male Today's Date: 04/23/2024   PT End of Session - 04/23/24 0723     Visit Number 6    Number of Visits 12    Date for PT Re-Evaluation 05/07/24    PT Start Time 0715    PT Stop Time 0804    PT Time Calculation (min) 49 min    Equipment Utilized During Treatment Gait belt    Activity Tolerance Patient tolerated treatment well    Behavior During Therapy WFL for tasks assessed/performed         Past Medical History:  Diagnosis Date   Arthralgia    shoulder    Asthma    Asymmetrical hearing loss    Atypical chest pain    Brachial plexus disorders    Bradycardia    Carpal tunnel syndrome    Cerebral infarction (HCC)    Closed fracture of wrist    Cognitive communication deficit    Dysarthria and anarthria    Exposure to Agent Orange    GERD (gastroesophageal reflux disease)    Grade I diastolic dysfunction    HOH (hard of hearing)    bilateral   Hyperlipidemia    Hypertrophy of prostate    benign    [name] (HCC)    [name] (HCC)    Low back pain    Lumbar spine pain    compression fracture    Malignant neoplasm (HCC)    skin    Metatarsalgia    Osteopenia    Plantar fibromatosis    Secondary parkinsonism (HCC)    Stroke (HCC) 78 yrs old   no residual effects   SVT (supraventricular tachycardia) (HCC)    Syncope    Transient ischemic attack    Trigger finger    Past Surgical History:  Procedure Laterality Date   BACK SURGERY     BROW LIFT Bilateral 03/26/2021   Procedure: BLEPHAROPLASTY UPPER EYELID; W/EXCESS SKIN BROW PTOSIS REPAIR  BLEPHAROPTOSIS REPAIR; RESECT EX BILATERAL;  Surgeon: Zacarias Hermann, MD;  Location: [facility];  Service: Ophthalmology;  Laterality: Bilateral;   CATARACT EXTRACTION W/PHACO Left 04/11/2024   Procedure: PHACOEMULSIFICATION, CATARACT, WITH IOL INSERTION  6.21  00:46.9;  Surgeon:  Trudi Fus, MD;  Location: [facility];  Service: Ophthalmology;  Laterality: Left;   LOOP RECORDER REMOVAL N/A 06/06/2023   Procedure: LOOP RECORDER REMOVAL;  Surgeon: Verona Goodwill, MD;  Location: [facility] INVASIVE CV LAB;  Service: Cardiovascular;  Laterality: N/A;   PACEMAKER IMPLANT N/A 06/06/2023   Procedure: PACEMAKER IMPLANT;  Surgeon: Verona Goodwill, MD;  Location: [facility] INVASIVE CV LAB;  Service: Cardiovascular;  Laterality: N/A;   WRIST SURGERY Right    Patient Active Problem List   Diagnosis Date Noted   Lightheadedness 05/08/2023   Implantable loop recorder present 03/31/2023   NSVT (nonsustained ventricular tachycardia) (HCC) 02/13/2023   SVT (supraventricular tachycardia) - non sustained 02/13/2023   Near syncope 12/13/2022   [name] (HCC) 12/13/2022   Sinus bradycardia 12/13/2022   History of CVA (cerebrovascular accident) 12/13/2022   Hyperlipidemia 12/13/2022   Dyspnea on exertion 12/13/2022    PCP: Carolanne Church, MD  Referring Provider: Carolanne Church, MD  REFERRING PROVIDER:   REFERRING DIAGNOSIS:  Z74.09 (ICD-10-CM) - Declining mobility   THERAPY DIAG: Unsteadiness on feet  Other abnormalities of gait and mobility  Muscle weakness (generalized)  Difficulty in walking,  not elsewhere classified  ONSET DATE: chronic  FOLLOW UP APPT WITH PROVIDER: PRN   RATIONALE FOR EVALUATION AND TREATMENT: Rehabilitation   SUBJECTIVE:                                                                                                                                                                                         Chief Complaint: [name] is a 78 year old male with Hx of [name] and Parkinsonism.  Pt. Reports difficulty with stairs and recent falls reported.  Pt. States he is not walking as much and was walking 5 miles/day.    Pertinent History Pt is a 78 year old male with Hx of Lewy Body Demantia and Parkinsonism. Past  medical history is significant for [name], TIA, CVA in 2013, asymmetrical hearing loss, dypsnea on exertion, anxiety, HLD, asthma. Pt wants to walk up to 5 miles per day; he walks 2-3 miles presently (pt walks around neighborhood streets mostly). Patient reports he did use walking stick, but using it has irritated his back (known arthritis per patient). Patient has diplopia and reports blurry vision at a distance. Pt likes to walk in woods and recently limited with descending stairs.    Pain: Yes; comorbid back pain/OA Numbness/Tingling: No Focal Weakness: No. Recent changes in overall health/medication: Yes; steadily worsening [name]  Prior history of physical therapy for balance:  Yes; see PT notes  Falls: Has patient fallen in last 6 months? Yes, Number of falls: 2 - fell on stairs in garage about 1 month ago/ fell the other day while putting pants on got tangled up Directional pattern for falls: Yes; falling forward when going down stairs and when he tripped one year ago   Imaging: Yes ;  CLINICAL DATA:  Transient ischemic attack (TIA). Neuro deficit, acute, stroke suspected.   Prior level of function: Independent Occupational demands: Retired Presenter, broadcasting: Walking routine; pt used to enjoy fishing   Red flags (bowel/bladder changes, saddle paresthesia, personal history of cancer, h/o spinal tumors, h/o compression fx, h/o abdominal aneurysm, abdominal pain, chills/fever, night sweats, nausea, vomiting, unrelenting pain): Negative  Precautions: Fall history, fall risk; pt has pacemaker  Weight Bearing Restrictions: No  Living Environment Lives with: lives with their spouse Lives in: House/apartment; 4-5 steps with rail on R side going up.  lives in house 4-5 STE with rail remodeled bathroom with seat available (does not use) 1 level with bonus room - 12 steps with rail (where his bedroom is located) grandchildren local- enjoys playing horse basketball with  them   Patient Goals: Maintain his level of function/independence    OBJECTIVE:   Patient Surveys  Berg: 54/56 ABC: 57% LEFS: 34 out of 80  Cognition Patient is oriented to person, place, and time.  Recent memory is intact.  Remote memory is moderately impaired.  Attention span and concentration are intact.  Expressive speech is intact, hypophonic.  Benefits from tx. In a quiet space Patient's fund of knowledge is within normal limits for educational level.    Gross Musculoskeletal Assessment/Observation Tremor: No resting tremor/intention tremor Bulk: Normal Tone: Hypotonic  Mask face, hypophonia.   GAIT: Distance walked: 470 ft  Assistive device utilized: None Level of assistance: SBA Comments: Good heel strike and velocity/cadence. Absent arm swing and rigid trunk. Ambulate outside on varying terrain/ parking lot.  Ascending/descending stairs  Posture: Rounded shoulder, forward head, trunk remains slightly flexed in sitting/standing    LE MMT:  MMT (out of 5) Right 03/26/2024 Left 03/26/2024  Hip flexion 5 5  Hip extension    Hip abduction (seated) 5 5  Hip adduction (seated) 5 5  Hip internal rotation    Hip external rotation    Knee flexion 5 5  Knee extension 5 5  Ankle dorsiflexion 5 5  Ankle plantarflexion    Ankle inversion    Ankle eversion    (* = pain; Blank rows = not tested)   Sensation Grossly intact to light touch bilateral LEs as determined by testing dermatomes L2-S2. Proprioception, and hot/cold testing deferred on this date.  Reflexes Deferred  Cranial Nerves Visual acuity and visual fields are intact  Extraocular muscles are intact  -pt exhibits convergence insufficiency  Facial sensation is intact bilaterally  Facial strength is intact bilaterally  Hearing loss is mild as tested by gross conversation Palate elevates midline, normal phonation  Shoulder shrug strength is intact  Tongue protrudes slightly L     Coordination/Cerebellar Finger to Nose: Good Heel to Shin: Good Finger Opposition: Good   FUNCTIONAL OUTCOME MEASURES   Results Comments  BERG  Low fall risk          TUG NT   5TSTS 8.2 seconds Slight backward LOB during 5th rep, pt   (Blank rows = not tested)   FUNCTIONAL TASKS  Stairs: pt demonstrates reciprocal stair ascent with no handrail support, no loss of balance and sound toe clearance with each successive step Sit to stand: pt performs readily with no upper limb support, intermittently pushes posterior thighs on chair to assist and has one incident of mild posterior LOB upon attaining standing position   TODAY'S TREATMENT  04/23/2024  Subjective:  Pt. Had cataract surgery to L eye a couple weeks ago.  Pt. Reports he is doing better today and is scheduled for cataract surgery to R eye this Thursday (6/19).  No c/o pain or loss of balance since last PT tx. Session.      Therapeutic Exercise:  No charge  Nustep L5 B UE/LE 10 min. (Discussed daily activity/ recent cataract surgery)   There.act.:  Large amplitude movements in // bars: forward/ lateral lunges, rocking forward and backwards with added shoulder flexion, Sit to stand with no UE assist (reach down to floor and then stand), step back with added DF on opposite leg, standing full body rotations to both sides with reaching.   33 min.   Walking in clinic with exaggerated arm swing.    Walking in hallway [facility]):  4 laps with exaggerated arm swing  2 laps with touching hand to opposite knee  2 laps with pt tossing a ball to himself  2 laps with  pt passing ball back and forth to PT  2 laps with pt tossing a ball to and from PT  2 laps with head turns as pt read letters off sticky notes on wall 2 laps with pt rotating a ball from side to side   NOT TODAY: Walking in //-bars (5# ankle wts.):  6/12 hurdles with focus on hip flexion/ preventing circumduction.  Light UE assist required initially and  progressing to no UE assist with recip. Gait pattern.    Walking in //-bars: Airex tandem pad step ups/down 5 laps.  Lateral walking with Airex tandem pad 5 laps.    Resisted gait 2BTB 7x all 4-planes in //-bars with no UE assist.  Improved BOS  Walking outside on stairs with focus on heel clearance.  Ascending/descending stairs with recip. Pattern and no UE assist.  SBA for cuing/ safety.    Star ex.: Blaze pods (6 pods)- SLS with pod touches 1 min. On L/R x 2.  Average 34 taps.    Blaze pod step touches (1st/2nd/3rd step).  Random taps 1 min. X 2 each on L/R.    Walking in grassy terrain/ up and down curbs 5x.    Amb. In //-bars with 12 hurdle clearance.  Forward/ lateral with no UE assist.  Challenged more with lateral steps as compared to forward step overs.    STS from gray chair with no UE assist and added lunges with B shoulder flexion 10x on L/R.  No LOB and pt. Instructed to slow down.    BOSU step ups in //-bars with light UE and progressing to no UE assist.  Good ankle/LE control and pts. Confidence improved after several reps.   Seated 5#: LAQ/ marching 20x.  Standing heel raises 20x in //-bars. No UE assist needed.   Standing 3-way hip ex. (5# ankle wts)- cuing to no use UE to improve balance.  20x each.      PATIENT EDUCATION:  Education details: see above for patient education details Person educated: Patient Education method: Explanation Education comprehension: verbalized understanding   HOME EXERCISE PROGRAM: Reviewed currently HEP   ASSESSMENT:  CLINICAL IMPRESSION: Pt highly motivated and works hard during tx. Session.  Primary focus on large amplitude exercises and dynamic balance with gait.  Pt continues to require occasional verbal and tactile cues to improve form with large amplitude exercises, especially to improve L shoulder extension.  Pt  demonstrates good strength and power with STS from clinic chair, though he reports having difficulty rising from  his recliner. Pt was responsive to verbal cues from PT to speak louder when reading sticky notes in the hall.     REHAB POTENTIAL: Good  CLINICAL DECISION MAKING: Unstable/unpredictable  EVALUATION COMPLEXITY: High   GOALS: Goals reviewed with patient? Yes  SHORT TERM GOALS: Target date: 04/16/2024  Pt will be independent with HEP in order to improve strength and balance in order to decrease fall risk and improve function at home. Baseline: initial eval: Formal HEP to be initiated at second follow-up visit.  Goal status: Partially met   LONG TERM GOALS: Target date: 05/07/2024  Pt will increase LEFS to >50 out of 80 to improve functional mobility/ decrease fall risk.        Baseline: 34 out of 80 Goal status: INITIAL  2.  Pt will increase ABC to >70% to improve confidence/ independence with walking and descending stairs.        Baseline: 57% Goal status: INITIAL  3.  Pt. Able  to descend stairs with reciprocal pattern and consistent clearance of heel to decrease fall risk.        Baseline: recent fall descending stairs.  Extra time to complete/ consistently clear heel.        Goal status: INITIAL   PLAN: PT FREQUENCY: 2x/week  PT DURATION: 6 weeks  PLANNED INTERVENTIONS: Therapeutic exercises, Therapeutic activity, Neuromuscular re-education, Balance training, Gait training, Patient/Family education, Joint manipulation, Joint mobilization, Canalith repositioning, Aquatic Therapy, Dry Needling, Cognitive remediation, Electrical stimulation, Spinal manipulation, Spinal mobilization, Cryotherapy, Moist heat, Traction, Ultrasound, Ionotophoresis 4mg /ml Dexamethasone, and Manual therapy  PLAN FOR NEXT SESSION: Discuss cataract surgery/ reassess goals, add seated sustained hold exercises, stair training    Lendell Quarry, PT, DPT # [phone] Acy Orsak, SPT 04/23/2024, 8:19 AM

## 2024-04-25 ENCOUNTER — Other Ambulatory Visit: Payer: Self-pay

## 2024-04-25 ENCOUNTER — Ambulatory Visit
Admission: RE | Admit: 2024-04-25 | Discharge: 2024-04-25 | Disposition: A | Discharge status: Home / Self Care | Source: Ambulatory Visit | Attending: Ophthalmology | Admitting: Ophthalmology

## 2024-04-25 ENCOUNTER — Encounter: Admitting: Physical Therapy

## 2024-04-25 ENCOUNTER — Ambulatory Visit: Payer: Self-pay | Admitting: Anesthesiology

## 2024-04-25 ENCOUNTER — Encounter: Payer: Self-pay | Admitting: Ophthalmology

## 2024-04-25 ENCOUNTER — Encounter
Admission: RE | Disposition: A | Discharge status: Home / Self Care | Payer: Self-pay | Source: Ambulatory Visit | Attending: Ophthalmology

## 2024-04-25 DIAGNOSIS — K219 Gastro-esophageal reflux disease without esophagitis: Secondary | ICD-10-CM | POA: Insufficient documentation

## 2024-04-25 DIAGNOSIS — H2511 Age-related nuclear cataract, right eye: Secondary | ICD-10-CM | POA: Diagnosis present

## 2024-04-25 DIAGNOSIS — I6990 Unspecified sequelae of unspecified cerebrovascular disease: Secondary | ICD-10-CM | POA: Insufficient documentation

## 2024-04-25 SURGERY — PHACOEMULSIFICATION, CATARACT, WITH IOL INSERTION
Anesthesia: Monitor Anesthesia Care | Site: Eye | Laterality: Right

## 2024-04-25 MED ORDER — BRIMONIDINE TARTRATE-TIMOLOL 0.2-0.5 % OP SOLN
OPHTHALMIC | Status: DC | PRN
  Administered 2024-04-25: 1 [drp] via OPHTHALMIC

## 2024-04-25 MED ORDER — SEVOFLURANE IN SOLN
RESPIRATORY_TRACT | Status: AC
Start: 2024-04-25 — End: 2024-04-25
  Filled 2024-04-25: qty 250

## 2024-04-25 MED ORDER — SIGHTPATH DOSE#1 NA HYALUR & NA CHOND-NA HYALUR IO KIT
PACK | INTRAOCULAR | Status: DC | PRN
  Administered 2024-04-25: 1 via OPHTHALMIC

## 2024-04-25 MED ORDER — LIDOCAINE HCL (PF) 2 % IJ SOLN
INTRAOCULAR | Status: DC | PRN
  Administered 2024-04-25: 4 mL via INTRAOCULAR

## 2024-04-25 MED ORDER — ARMC OPHTHALMIC DILATING DROPS
1.0000 | OPHTHALMIC | Status: DC | PRN
  Administered 2024-04-25 (×3): 1 via OPHTHALMIC

## 2024-04-25 MED ORDER — ARMC OPHTHALMIC DILATING DROPS
OPHTHALMIC | Status: AC
  Filled 2024-04-25: qty 0.5

## 2024-04-25 MED ORDER — FENTANYL CITRATE (PF) 100 MCG/2ML IJ SOLN
INTRAMUSCULAR | Status: DC | PRN
  Administered 2024-04-25: 25 ug via INTRAVENOUS

## 2024-04-25 MED ORDER — SIGHTPATH DOSE#1 BSS IO SOLN
INTRAOCULAR | Status: DC | PRN
  Administered 2024-04-25: 15 mL via INTRAOCULAR

## 2024-04-25 MED ORDER — TETRACAINE HCL 0.5 % OP SOLN
OPHTHALMIC | Status: AC
  Filled 2024-04-25: qty 4

## 2024-04-25 MED ORDER — FENTANYL CITRATE (PF) 100 MCG/2ML IJ SOLN
INTRAMUSCULAR | Status: AC
  Filled 2024-04-25: qty 2

## 2024-04-25 MED ORDER — TETRACAINE HCL 0.5 % OP SOLN
OPHTHALMIC | Status: AC
Start: 2024-04-25 — End: 2024-04-25
  Filled 2024-04-25: qty 4

## 2024-04-25 MED ORDER — MOXIFLOXACIN HCL 0.5 % OP SOLN
OPHTHALMIC | Status: DC | PRN
  Administered 2024-04-25: .2 mL

## 2024-04-25 MED ORDER — LACTATED RINGERS IV SOLN: INTRAVENOUS | Status: DC

## 2024-04-25 MED ORDER — TETRACAINE HCL 0.5 % OP SOLN
1.0000 [drp] | OPHTHALMIC | Status: DC | PRN
  Administered 2024-04-25 (×3): 1 [drp] via OPHTHALMIC

## 2024-04-25 MED ORDER — MIDAZOLAM HCL 2 MG/2ML IJ SOLN
INTRAMUSCULAR | Status: AC
  Filled 2024-04-25: qty 2

## 2024-04-25 MED ORDER — FENTANYL CITRATE PF 50 MCG/ML IJ SOSY: 25.0000 ug | PREFILLED_SYRINGE | INTRAMUSCULAR | Status: DC | PRN

## 2024-04-25 MED ORDER — PHENYLEPHRINE-KETOROLAC 1-0.3 % IO SOLN
INTRAOCULAR | Status: DC | PRN
  Administered 2024-04-25: 70 mL via OPHTHALMIC

## 2024-04-25 SURGICAL SUPPLY — 12 items
CATARACT SUITE SIGHTPATH (MISCELLANEOUS) ×1 IMPLANT
DISSECTOR HYDRO NUCLEUS 50X22 (MISCELLANEOUS) ×1 IMPLANT
DRSG TEGADERM 2-3/8X2-3/4 SM (GAUZE/BANDAGES/DRESSINGS) ×1 IMPLANT
FEE CATARACT SUITE SIGHTPATH (MISCELLANEOUS) ×1 IMPLANT
GLOVE BIOGEL PI IND STRL 8 (GLOVE) ×1 IMPLANT
GLOVE SURG LX STRL 7.5 STRW (GLOVE) ×1 IMPLANT
GLOVE SURG PROTEXIS BL SZ6.5 (GLOVE) ×1 IMPLANT
GLOVE SURG SYN 6.5 PF PI BL (GLOVE) ×1 IMPLANT
LENS IOL TECNIS MONO 21.5 (Intraocular Lens) IMPLANT
NDL FILTER BLUNT 18X1 1/2 (NEEDLE) ×1 IMPLANT
NEEDLE FILTER BLUNT 18X1 1/2 (NEEDLE) ×1 IMPLANT
SYR 3ML LL SCALE MARK (SYRINGE) ×1 IMPLANT

## 2024-04-25 NOTE — Anesthesia Preprocedure Evaluation (Addendum)
 Anesthesia Evaluation  Patient identified by MRN, date of birth, ID band Patient awake    Reviewed: Allergy & Precautions, NPO status , Patient's Chart, lab work & pertinent test results  History of Anesthesia Complications Negative for: history of anesthetic complications  Airway Mallampati: III  TM Distance: <3 FB Neck ROM: full    Dental  (+) Chipped   Pulmonary neg shortness of breath, asthma    Pulmonary exam normal        Cardiovascular Exercise Tolerance: Good (-) angina negative cardio ROS Normal cardiovascular exam     Neuro/Psych  PSYCHIATRIC DISORDERS     Dementia  Neuromuscular disease CVA, Residual Symptoms    GI/Hepatic Neg liver ROS,GERD  Controlled,,  Endo/Other  negative endocrine ROS    Renal/GU      Musculoskeletal   Abdominal   Peds  Hematology negative hematology ROS (+)   Anesthesia Other Findings Past Medical History: No date: Arthralgia     Comment:  shoulder  No date: Asthma No date: Asymmetrical hearing loss No date: Atypical chest pain No date: Brachial plexus disorders No date: Bradycardia No date: Carpal tunnel syndrome No date: Cerebral infarction (HCC) No date: Closed fracture of wrist No date: Cognitive communication deficit No date: Dysarthria and anarthria No date: Exposure to Agent Orange No date: GERD (gastroesophageal reflux disease) No date: Grade I diastolic dysfunction No date: HOH (hard of hearing)     Comment:  bilateral No date: Hyperlipidemia No date: Hypertrophy of prostate     Comment:  benign  No date: [name] (HCC) No date: [name] (HCC) No date: Low back pain No date: Lumbar spine pain     Comment:  compression fracture  No date: Malignant neoplasm (HCC)     Comment:  skin  No date: Metatarsalgia No date: Osteopenia No date: Plantar fibromatosis No date: Secondary parkinsonism (HCC) 78 yrs old: Stroke [facility])     Comment:   no residual effects No date: SVT (supraventricular tachycardia) (HCC) No date: Syncope No date: Transient ischemic attack No date: Trigger finger  Past Surgical History: No date: BACK SURGERY 03/26/2021: BROW LIFT; Bilateral     Comment:  Procedure: BLEPHAROPLASTY UPPER EYELID; W/EXCESS SKIN               BROW PTOSIS REPAIR  BLEPHAROPTOSIS REPAIR; RESECT EX               BILATERAL;  Surgeon: Zacarias Hermann, MD;  Location: [facility]              S[facility];  Service: Ophthalmology;  Laterality:               Bilateral; 04/11/2024: CATARACT EXTRACTION W/PHACO; Left     Comment:  Procedure: PHACOEMULSIFICATION, CATARACT, WITH IOL               INSERTION  6.21  00:46.9;  Surgeon: Trudi Fus, MD;  Location: [facility];  Service:               Ophthalmology;  Laterality: Left; 06/06/2023: LOOP RECORDER REMOVAL; N/A     Comment:  Procedure: LOOP RECORDER REMOVAL;  Surgeon: Verona Goodwill, MD;  Location: [facility] INVASIVE CV LAB;  Service:               Cardiovascular;  Laterality: N/A; 06/06/2023: PACEMAKER IMPLANT; N/A     Comment:  Procedure: PACEMAKER IMPLANT;  Surgeon: Verona Goodwill,              MD;  Location: [facility] INVASIVE CV LAB;  Service:               Cardiovascular;  Laterality: N/A; No date: WRIST SURGERY; Right  BMI    Body Mass Index: 26.41 kg/m      Reproductive/Obstetrics negative OB ROS                             Anesthesia Physical Anesthesia Plan  ASA: 2  Anesthesia Plan: MAC   Post-op Pain Management:    Induction: Intravenous  PONV Risk Score and Plan:   Airway Management Planned: Natural Airway and Nasal Cannula  Additional Equipment:   Intra-op Plan:   Post-operative Plan:   Informed Consent: I have reviewed the patients History and Physical, chart, labs and discussed the procedure including the risks, benefits and alternatives for the proposed anesthesia with the patient or  authorized representative who has indicated his/her understanding and acceptance.     Dental Advisory Given  Plan Discussed with: Anesthesiologist, CRNA and Surgeon  Anesthesia Plan Comments: (Patient and wife consented for risks of anesthesia including but not limited to:  - adverse reactions to medications - damage to eyes, teeth, lips or other oral mucosa - nerve damage due to positioning  - sore throat or hoarseness - Damage to heart, brain, nerves, lungs, other parts of body or loss of life  They voiced understanding and assent.)       Anesthesia Quick Evaluation

## 2024-04-25 NOTE — H&P (Signed)
 [facility]   Primary Care Physician:  Carolanne Church, MD Ophthalmologist: Dr. Meg Spina  Pre-Procedure History & Physical: HPI:  Roger Sanchez is a 78 y.o. male here for cataract surgery.   Past Medical History:  Diagnosis Date   Arthralgia    shoulder    Asthma    Asymmetrical hearing loss    Atypical chest pain    Brachial plexus disorders    Bradycardia    Carpal tunnel syndrome    Cerebral infarction (HCC)    Closed fracture of wrist    Cognitive communication deficit    Dysarthria and anarthria    Exposure to Agent Orange    GERD (gastroesophageal reflux disease)    Grade I diastolic dysfunction    HOH (hard of hearing)    bilateral   Hyperlipidemia    Hypertrophy of prostate    benign    [name] (HCC)    [name] (HCC)    Low back pain    Lumbar spine pain    compression fracture    Malignant neoplasm (HCC)    skin    Metatarsalgia    Osteopenia    Plantar fibromatosis    Secondary parkinsonism (HCC)    Stroke (HCC) 78 yrs old   no residual effects   SVT (supraventricular tachycardia) (HCC)    Syncope    Transient ischemic attack    Trigger finger     Past Surgical History:  Procedure Laterality Date   BACK SURGERY     BROW LIFT Bilateral 03/26/2021   Procedure: BLEPHAROPLASTY UPPER EYELID; W/EXCESS SKIN BROW PTOSIS REPAIR  BLEPHAROPTOSIS REPAIR; RESECT EX BILATERAL;  Surgeon: Zacarias Hermann, MD;  Location: [facility];  Service: Ophthalmology;  Laterality: Bilateral;   CATARACT EXTRACTION W/PHACO Left 04/11/2024   Procedure: PHACOEMULSIFICATION, CATARACT, WITH IOL INSERTION  6.21  00:46.9;  Surgeon: Trudi Fus, MD;  Location: [facility];  Service: Ophthalmology;  Laterality: Left;   LOOP RECORDER REMOVAL N/A 06/06/2023   Procedure: LOOP RECORDER REMOVAL;  Surgeon: Verona Goodwill, MD;  Location: [facility] IN[facility]AS[facility] CV [facility];  Service: Cardiovascular;  Laterality: N/A;   PACEMAKER IMPLANT N/A  06/06/2023   Procedure: PACEMAKER IMPLANT;  Surgeon: Verona Goodwill, MD;  Location: [facility] INVASIVE CV LAB;  Service: Cardiovascular;  Laterality: N/A;   WRIST SURGERY Right     Prior to Admission medications   Medication Sig Start Date End Date Taking? Authorizing Provider  Albuterol  Sulfate 108 (90 Base) MCG/ACT AEPB Inhale 2 puffs into the lungs QID.    [provider]  ascorbic acid  (VITAMIN C ) 500 MG tablet Take 500 mg by mouth daily.    [provider]  aspirin  EC 81 MG tablet Take 81 mg by mouth daily.    [provider]  atorvastatin  (LIPITOR) 20 MG tablet Take 10 mg by mouth at bedtime.    [provider]  clopidogrel  (PLAVIX ) 75 MG tablet Take 75 mg by mouth daily.    [provider]  clotrimazole (LOTRIMIN) 1 % cream Apply topically. Apply small amount to toe nails at bedtime to treat toenail fungus    [provider]  famotidine  (PEPCID ) 20 MG tablet Take 20 mg by mouth 2 (two) times daily.    [provider]  flecainide  (TAMBOCOR ) 50 MG tablet Take 0.5 tablets (25 mg total) by mouth 2 (two) times daily. 03/14/24   Verona Goodwill, MD  glycopyrrolate  (ROBINUL ) 2 MG tablet Take by  mouth. 05/31/23   [provider]  GUAIFENESIN CR PO Take by mouth. prn    [provider]  lidocaine  (XYLOCAINE ) 5 % ointment Apply topically. Patient not taking: Reported on 04/11/2024 08/17/22   [provider]  Multiple Vitamins-Minerals (MULTI-VITAMIN/MINERALS PO) Take by mouth.    [provider]  naproxen (NAPROSYN) 500 MG tablet Take 500 mg by mouth 2 (two) times daily with a meal.    [provider]  pantoprazole  (PROTONIX ) 40 MG tablet Take 40 mg by mouth 2 (two) times daily.    [provider]  tamsulosin  (FLOMAX ) 0.4 MG CAPS capsule Take 0.4 mg by mouth.    [provider]    Allergies as of 03/20/2024 - Review Complete 03/18/2024  Allergen Reaction Noted   Barley grass   11/12/2018   Corn oil Other (See Comments) 03/23/2012   Corn-containing products Other (See Comments) 11/12/2018   Malt Other (See Comments) 11/12/2018   Milk (cow) Other (See Comments) 03/23/2012   Milk-related compounds  11/12/2018   Peanut-containing drug products Other (See Comments) 03/23/2012   Simvastatin Other (See Comments) 08/02/2004   Wheat  08/02/2004    Family History  Problem Relation Age of Onset   Heart Problems Father    Heart attack Paternal Aunt    Heart attack Paternal Uncle     Social History   Socioeconomic History   Marital status: Married    Spouse name: Not on file   Number of children: Not on file   Years of education: Not on file   Highest education level: Not on file  Occupational History   Not on file  Tobacco Use   Smoking status: Never    Passive exposure: Never   Smokeless tobacco: Never  Vaping Use   Vaping status: Never Used  Substance and Sexual Activity   Alcohol use: Never   Drug use: Never   Sexual activity: Not Currently  Other Topics Concern   Not on file  Social History Narrative   Not on file   Social Drivers of Health   Financial Resource Strain: Low Risk  (02/15/2024)   Received from [facility]   Overall Financial Resource Strain (CARDIA)    Difficulty of Paying Living Expenses: Not hard at all  Food Insecurity: No Food Insecurity (02/15/2024)   Received from [facility]   Hunger Vital Sign    Within the past 12 months, you worried that your food would run out before you got the money to buy more.: Never true    Within the past 12 months, the food you bought just didn't last and you didn't have money to get more.: Never true  Transportation Needs: No Transportation Needs (02/15/2024)   Received from [facility]    In the past 12 months, has lack of transportation kept you from medical appointments or from getting medications?: No    Lack  of Transportation (Non-Medical): No  Physical Activity: Not on file  Stress: Not on file  Social Connections: Not on file  Intimate Partner Violence: Not At Risk (12/13/2022)   Humiliation, Afraid, Rape, and Kick questionnaire    Fear of Current or Ex-Partner: No    Emotionally Abused: No    Physically Abused: No    Sexually Abused: No    Review of Systems: See HPI, otherwise negative ROS  Physical Exam: Ht 5' 5.98 (1.676 m)   Wt 75.7 kg  BMI 26.94 kg/m  General:   Alert, cooperative in NAD Head:  Normocephalic and atraumatic. Respiratory:  Normal work of breathing. Cardiovascular:  RRR  Impression/Plan: BECKETT MADEN is here for cataract surgery.  Risks, benefits, limitations, and alternatives regarding cataract surgery have been reviewed with the patient.  Questions have been answered.  All parties agreeable.   Trudi Fus, MD  04/25/2024, 7:06 AM

## 2024-04-25 NOTE — Transfer of Care (Signed)
 Immediate Anesthesia Transfer of Care Note  Patient: Roger Sanchez [facility]  Procedure(s) Performed: PHACOEMULSIFICATION, CATARACT, WITH IOL INSERTION 7.21 00:46.2 (Right: Eye)  Patient Location: PACU  Anesthesia Type: MAC  Level of Consciousness: awake, alert  and patient cooperative  Airway and Oxygen Therapy: Patient Spontanous Breathing and Patient connected to supplemental oxygen  Post-op Assessment: Post-op Vital signs reviewed, Patient's Cardiovascular Status Stable, Respiratory Function Stable, Patent Airway and No signs of Nausea or vomiting  Post-op Vital Signs: Reviewed and stable  Complications: No notable events documented.

## 2024-04-25 NOTE — Anesthesia Postprocedure Evaluation (Signed)
 Anesthesia Post Note  Patient: Roger Sanchez  Procedure(s) Performed: PHACOEMULSIFICATION, CATARACT, WITH IOL INSERTION 7.21 00:46.2 (Right: Eye)  Patient location during evaluation: PACU Anesthesia Type: General Level of consciousness: awake and alert Pain management: pain level controlled Vital Signs Assessment: post-procedure vital signs reviewed and stable Respiratory status: spontaneous breathing, nonlabored ventilation and respiratory function stable Cardiovascular status: blood pressure returned to baseline and stable Postop Assessment: no apparent nausea or vomiting Anesthetic complications: no   No notable events documented.   Last Vitals:  Vitals:   04/25/24 1318 04/25/24 1324  BP: (!) 157/105 134/82  Pulse:  (!) 59  Resp:  13  Temp: 36.7 C 36.7 C  SpO2:  98%    Last Pain:  Vitals:   04/25/24 1324  TempSrc:   PainSc: 0-No pain                 Roger Sanchez

## 2024-04-25 NOTE — Op Note (Signed)
 OPERATIVE NOTE  CHARLETON DEYOUNG [id number] 04/25/2024   PREOPERATIVE DIAGNOSIS: Nuclear sclerotic cataract right eye. H25.11   POSTOPERATIVE DIAGNOSIS: Nuclear sclerotic cataract right eye. H25.11   PROCEDURE:  Phacoemusification with posterior chamber intraocular lens placement of the right eye  Ultrasound time: Procedure(s): PHACOEMULSIFICATION, CATARACT, WITH IOL INSERTION 7.21 00:46.2 (Right)  LENS:   Implant Name Type Inv. Item Serial No. Manufacturer Lot No. LRB No. Used Action  LENS IOL TECNIS MONO 21.5 - [id number] Intraocular Lens LENS IOL TECNIS MONO 21.5 [id number] [employer]  Right 1 Wasted  LENS IOL TECNIS MONO 21.5 - [id number] Intraocular Lens LENS IOL TECNIS MONO 21.5 [id number] [employer]  Right 1 Implanted      SURGEON:  Rosy Cooper. Donalda Fruit, MD   ANESTHESIA:  Topical with tetracaine  drops, augmented with 1% preservative-free intracameral lidocaine .   COMPLICATIONS:  None.   DESCRIPTION OF PROCEDURE:  The patient was identified in the holding room and transported to the operating room and placed in the supine position under the operating microscope.  The right eye was identified as the operative eye, which was prepped and draped in the usual sterile ophthalmic fashion.   A 1 millimeter clear-corneal paracentesis was made superotemporally. Preservative-free 1% lidocaine  mixed with 1:1,000 bisulfite-free aqueous solution of epinephrine  was injected into the anterior chamber. The anterior chamber was then filled with Viscoat viscoelastic. A 2.4 millimeter keratome was used to make a clear-corneal incision inferotemporally. A curvilinear capsulorrhexis was made with a cystotome and capsulorrhexis forceps. Balanced salt  solution was used to hydrodissect and hydrodelineate the nucleus. Phacoemulsification was then used to remove the lens nucleus and epinucleus. The remaining cortex was then removed using the irrigation and aspiration handpiece. Provisc was then placed into the  capsular bag to distend it for lens placement. A +21.50 D [id number] intraocular lens was then injected into the capsular bag. The remaining viscoelastic was aspirated.   Wounds were hydrated with balanced salt  solution.  The anterior chamber was inflated to a physiologic pressure with balanced salt  solution.  No wound leaks were noted. Moxifloxacin  was injected intracamerally.  Timolol  and Brimonidine  drops were applied to the eye.  The patient was taken to the recovery room in stable condition without complications of anesthesia or surgery.  Meryl Acosta Marshall 04/25/2024, 1:16 PM

## 2024-04-26 ENCOUNTER — Encounter: Payer: Self-pay | Admitting: Ophthalmology

## 2024-04-30 ENCOUNTER — Ambulatory Visit: Admitting: Physical Therapy

## 2024-04-30 DIAGNOSIS — R2681 Unsteadiness on feet: Secondary | ICD-10-CM

## 2024-04-30 DIAGNOSIS — M6281 Muscle weakness (generalized): Secondary | ICD-10-CM

## 2024-04-30 DIAGNOSIS — R2689 Other abnormalities of gait and mobility: Secondary | ICD-10-CM

## 2024-04-30 DIAGNOSIS — R262 Difficulty in walking, not elsewhere classified: Secondary | ICD-10-CM

## 2024-04-30 NOTE — Therapy (Unsigned)
 OUTPATIENT PHYSICAL THERAPY BALANCE TREATMENT  Patient Name: Roger Sanchez MRN: [id number] DOB:05/15/1946, 78 y.o., male Today's Date: 04/30/2024   PT End of Session - 04/30/24 0723     Visit Number 7    Number of Visits 12    Date for PT Re-Evaluation 05/07/24    PT Start Time 0723    PT Stop Time 0812    PT Time Calculation (min) 49 min    Equipment Utilized During Treatment Gait belt    Activity Tolerance Patient tolerated treatment well    Behavior During Therapy WFL for tasks assessed/performed         Past Medical History:  Diagnosis Date   Arthralgia    shoulder    Asthma    Asymmetrical hearing loss    Atypical chest pain    Brachial plexus disorders    Bradycardia    Carpal tunnel syndrome    Cerebral infarction (HCC)    Closed fracture of wrist    Cognitive communication deficit    Dysarthria and anarthria    Exposure to Agent Orange    GERD (gastroesophageal reflux disease)    Grade I diastolic dysfunction    HOH (hard of hearing)    bilateral   Hyperlipidemia    Hypertrophy of prostate    benign    [name] (HCC)    [name] (HCC)    Low back pain    Lumbar spine pain    compression fracture    Malignant neoplasm (HCC)    skin    Metatarsalgia    Osteopenia    Plantar fibromatosis    Secondary parkinsonism (HCC)    Stroke (HCC) 78 yrs old   no residual effects   SVT (supraventricular tachycardia) (HCC)    Syncope    Transient ischemic attack    Trigger finger    Past Surgical History:  Procedure Laterality Date   BACK SURGERY     BROW LIFT Bilateral 03/26/2021   Procedure: BLEPHAROPLASTY UPPER EYELID; W/EXCESS SKIN BROW PTOSIS REPAIR  BLEPHAROPTOSIS REPAIR; RESECT EX BILATERAL;  Surgeon: Ashley Greig HERO, MD;  Location: [facility];  Service: Ophthalmology;  Laterality: Bilateral;   CATARACT EXTRACTION W/PHACO Left 04/11/2024   Procedure: PHACOEMULSIFICATION, CATARACT, WITH IOL INSERTION  6.21  00:46.9;  Surgeon:  Enola Feliciano Hugger, MD;  Location: [facility];  Service: Ophthalmology;  Laterality: Left;   CATARACT EXTRACTION W/PHACO Right 04/25/2024   Procedure: PHACOEMULSIFICATION, CATARACT, WITH IOL INSERTION 7.21 00:46.2;  Surgeon: Enola Feliciano Hugger, MD;  Location: [facility];  Service: Ophthalmology;  Laterality: Right;   LOOP RECORDER REMOVAL N/A 06/06/2023   Procedure: LOOP RECORDER REMOVAL;  Surgeon: Fernande Elspeth BROCKS, MD;  Location: [facility];  Service: Cardiovascular;  Laterality: N/A;   PACEMAKER IMPLANT N/A 06/06/2023   Procedure: PACEMAKER IMPLANT;  Surgeon: Fernande Elspeth BROCKS, MD;  Location: [facility] INV[facility] CV [facility];  Service: Cardiovascular;  Laterality: N/A;   WRIST SURGERY Right    Patient Active Problem List   Diagnosis Date Noted   Lightheadedness 05/08/2023   Implantable loop recorder present 03/31/2023   NSVT (nonsustained ventricular tachycardia) (HCC) 02/13/2023   SVT (supraventricular tachycardia) - non sustained 02/13/2023   Near syncope 12/13/2022   [name] (HCC) 12/13/2022   Sinus bradycardia 12/13/2022   History of CVA (cerebrovascular accident) 12/13/2022   Hyperlipidemia 12/13/2022   Dyspnea on exertion 12/13/2022    PCP: Mitchell Bolt, MD  Referring Provider: Mitchell Bolt, MD  REFERRING PROVIDER:   REFERRING DIAGNOSIS:  Z74.09 (ICD-10-CM) - Declining mobility   THERAPY DIAG: Unsteadiness on feet  Other abnormalities of gait and mobility  Muscle weakness (generalized)  Difficulty in walking, not elsewhere classified  ONSET DATE: chronic  FOLLOW UP APPT WITH PROVIDER: PRN   RATIONALE FOR EVALUATION AND TREATMENT: Rehabilitation   SUBJECTIVE:                                                                                                                                                                                         Chief Complaint: [name] is a 78 year old male with Hx of [name] and  Parkinsonism.  Pt. Reports difficulty with stairs and recent falls reported.  Pt. States he is not walking as much and was walking 5 miles/day.    Pertinent History Pt is a 78 year old male with Hx of Lewy Body Demantia and Parkinsonism. Past medical history is significant for [name], TIA, CVA in 2013, asymmetrical hearing loss, dypsnea on exertion, anxiety, HLD, asthma. Pt wants to walk up to 5 miles per day; he walks 2-3 miles presently (pt walks around neighborhood streets mostly). Patient reports he did use walking stick, but using it has irritated his back (known arthritis per patient). Patient has diplopia and reports blurry vision at a distance. Pt likes to walk in woods and recently limited with descending stairs.    Pain: Yes; comorbid back pain/OA Numbness/Tingling: No Focal Weakness: No. Recent changes in overall health/medication: Yes; steadily worsening [name]  Prior history of physical therapy for balance:  Yes; see PT notes  Falls: Has patient fallen in last 6 months? Yes, Number of falls: 2 - fell on stairs in garage about 1 month ago/ fell the other day while putting pants on got tangled up Directional pattern for falls: Yes; falling forward when going down stairs and when he tripped one year ago   Imaging: Yes ;  CLINICAL DATA:  Transient ischemic attack (TIA). Neuro deficit, acute, stroke suspected.   Prior level of function: Independent Occupational demands: Retired Presenter, broadcasting: Walking routine; pt used to enjoy fishing   Red flags (bowel/bladder changes, saddle paresthesia, personal history of cancer, h/o spinal tumors, h/o compression fx, h/o abdominal aneurysm, abdominal pain, chills/fever, night sweats, nausea, vomiting, unrelenting pain): Negative  Precautions: Fall history, fall risk; pt has pacemaker  Weight Bearing Restrictions: No  Living Environment Lives with: lives with their spouse Lives in: House/apartment; 4-5 steps with rail on  R side going up.  lives in house 4-5 STE with rail remodeled bathroom with seat available (does not use) 1 level with bonus room -  12 steps with rail (where his bedroom is located) grandchildren local- enjoys playing horse basketball with them   Patient Goals: Maintain his level of function/independence    OBJECTIVE:   Patient Surveys  Berg: 54/56 ABC: 57% LEFS: 34 out of 80  Cognition Patient is oriented to person, place, and time.  Recent memory is intact.  Remote memory is moderately impaired.  Attention span and concentration are intact.  Expressive speech is intact, hypophonic.  Benefits from tx. In a quiet space Patient's fund of knowledge is within normal limits for educational level.    Gross Musculoskeletal Assessment/Observation Tremor: No resting tremor/intention tremor Bulk: Normal Tone: Hypotonic  Mask face, hypophonia.   GAIT: Distance walked: 470 ft  Assistive device utilized: None Level of assistance: SBA Comments: Good heel strike and velocity/cadence. Absent arm swing and rigid trunk. Ambulate outside on varying terrain/ parking lot.  Ascending/descending stairs  Posture: Rounded shoulder, forward head, trunk remains slightly flexed in sitting/standing    LE MMT:  MMT (out of 5) Right 03/26/2024 Left 03/26/2024  Hip flexion 5 5  Hip extension    Hip abduction (seated) 5 5  Hip adduction (seated) 5 5  Hip internal rotation    Hip external rotation    Knee flexion 5 5  Knee extension 5 5  Ankle dorsiflexion 5 5  Ankle plantarflexion    Ankle inversion    Ankle eversion    (* = pain; Blank rows = not tested)   Sensation Grossly intact to light touch bilateral LEs as determined by testing dermatomes L2-S2. Proprioception, and hot/cold testing deferred on this date.  Reflexes Deferred  Cranial Nerves Visual acuity and visual fields are intact  Extraocular muscles are intact  -pt exhibits convergence insufficiency  Facial sensation is  intact bilaterally  Facial strength is intact bilaterally  Hearing loss is mild as tested by gross conversation Palate elevates midline, normal phonation  Shoulder shrug strength is intact  Tongue protrudes slightly L    Coordination/Cerebellar Finger to Nose: Good Heel to Shin: Good Finger Opposition: Good   FUNCTIONAL OUTCOME MEASURES   Results Comments  BERG  Low fall risk          TUG NT   5TSTS 8.2 seconds Slight backward LOB during 5th rep, pt   (Blank rows = not tested)   FUNCTIONAL TASKS  Stairs: pt demonstrates reciprocal stair ascent with no handrail support, no loss of balance and sound toe clearance with each successive step Sit to stand: pt performs readily with no upper limb support, intermittently pushes posterior thighs on chair to assist and has one incident of mild posterior LOB upon attaining standing position   TODAY'S TREATMENT  04/30/2024  Subjective:  Pt. Had cataract surgery to R eye last week.  No c/o pain or loss of balance since last PT tx. Session.      Therapeutic Exercise:  No charge  Nustep L4 B UE/LE 10 min. (Discussed daily activity/ recent cataract surgery)   There.act.:  Large amplitude movements in seated with extended hold: Reaching down and to the side, reaching side to side with trunk rotation  Large amplitude movements in // bars: forward/ lateral lunges, rocking forward and backwards with added shoulder flexion, Sit to stand with no UE assist (reach down to floor and then stand), step back with added DF on opposite leg, standing full body rotations to both sides with reaching.   33 min.    Re-assessed outcome measures:  ABC: 60.6%  LEFS:  46/80  NOT TODAY: Walking in //-bars (5# ankle wts.):  6/12 hurdles with focus on hip flexion/ preventing circumduction.  Light UE assist required initially and progressing to no UE assist with recip. Gait pattern.    Walking in //-bars: Airex tandem pad step ups/down 5 laps.  Lateral  walking with Airex tandem pad 5 laps.    Resisted gait 2BTB 7x all 4-planes in //-bars with no UE assist.  Improved BOS  Walking outside on stairs with focus on heel clearance.  Ascending/descending stairs with recip. Pattern and no UE assist.  SBA for cuing/ safety.    Star ex.: Blaze pods (6 pods)- SLS with pod touches 1 min. On L/R x 2.  Average 34 taps.    Blaze pod step touches (1st/2nd/3rd step).  Random taps 1 min. X 2 each on L/R.    Walking in grassy terrain/ up and down curbs 5x.    Amb. In //-bars with 12 hurdle clearance.  Forward/ lateral with no UE assist.  Challenged more with lateral steps as compared to forward step overs.    STS from gray chair with no UE assist and added lunges with B shoulder flexion 10x on L/R.  No LOB and pt. Instructed to slow down.    BOSU step ups in //-bars with light UE and progressing to no UE assist.  Good ankle/LE control and pts. Confidence improved after several reps.   Seated 5#: LAQ/ marching 20x.  Standing heel raises 20x in //-bars. No UE assist needed.   Standing 3-way hip ex. (5# ankle wts)- cuing to no use UE to improve balance.  20x each.      PATIENT EDUCATION:  Education details: see above for patient education details Person educated: Patient Education method: Explanation Education comprehension: verbalized understanding   HOME EXERCISE PROGRAM: Reviewed currently HEP   ASSESSMENT:  CLINICAL IMPRESSION: Pt highly motivated and works hard during tx. Session.  Primary focus on large amplitude exercises.  Pt shows improved form with all large amplitude exercises and required fewer cues than previous sessions, though he continues to require verbal and tactile cues to improve L shoulder extension. Pt scores a 46/80 on the LEFS, which is a significant improvement from his LEFS score at eval (34/80), indicating improvement in his BLE function. He also scores 60.6% on the ABC scale, indicating that he is at an increased risk for  falls. However, this is an improvement from his ABC score at eval (57%), indicating that his confidence in his balance has improved since then.  Pt would benefit from continued skilled PT to reduce his fall risk and improve BLE strength.   REHAB POTENTIAL: Good  CLINICAL DECISION MAKING: Unstable/unpredictable  EVALUATION COMPLEXITY: High   GOALS: Goals reviewed with patient? Yes  SHORT TERM GOALS: Target date: 04/16/2024  Pt will be independent with HEP in order to improve strength and balance in order to decrease fall risk and improve function at home. Baseline: initial eval: Formal HEP to be initiated at second follow-up visit.  Goal status: Partially met   LONG TERM GOALS: Target date: 05/07/2024  Pt will increase LEFS to >50 out of 80 to improve functional mobility/ decrease fall risk.        Baseline: 34 out of 80.  6/25: 46 out of 80 Goal status: Partially met  2.  Pt will increase ABC to >70% to improve confidence/ independence with walking and descending stairs.        Baseline: 57%.  6/24: 60.6%  Goal status: Partially met  3.  Pt. Able to descend stairs with reciprocal pattern and consistent clearance of heel to decrease fall risk.        Baseline: recent fall descending stairs.  Extra time to complete/ consistently clear heel.        Goal status: Partially met   PLAN: PT FREQUENCY: 2x/week  PT DURATION: 6 weeks  PLANNED INTERVENTIONS: Therapeutic exercises, Therapeutic activity, Neuromuscular re-education, Balance training, Gait training, Patient/Family education, Joint manipulation, Joint mobilization, Canalith repositioning, Aquatic Therapy, Dry Needling, Cognitive remediation, Electrical stimulation, Spinal manipulation, Spinal mobilization, Cryotherapy, Moist heat, Traction, Ultrasound, Ionotophoresis 4mg /ml Dexamethasone, and Manual therapy  PLAN FOR NEXT SESSION: Continue large amplitude exercises, stair training    Ozell JAYSON Sero, PT, DPT # [phone] Lalo Tromp, SPT 04/30/2024, 9:19 AM

## 2024-05-01 ENCOUNTER — Encounter: Payer: Self-pay | Admitting: Physical Therapy

## 2024-05-02 ENCOUNTER — Ambulatory Visit: Admitting: Physical Therapy

## 2024-05-07 ENCOUNTER — Encounter: Payer: Self-pay | Admitting: Physical Therapy

## 2024-05-07 ENCOUNTER — Ambulatory Visit: Attending: Family Medicine | Admitting: Physical Therapy

## 2024-05-07 DIAGNOSIS — R2681 Unsteadiness on feet: Secondary | ICD-10-CM | POA: Diagnosis present

## 2024-05-07 DIAGNOSIS — R2689 Other abnormalities of gait and mobility: Secondary | ICD-10-CM | POA: Diagnosis present

## 2024-05-07 DIAGNOSIS — M6281 Muscle weakness (generalized): Secondary | ICD-10-CM | POA: Diagnosis present

## 2024-05-07 DIAGNOSIS — R262 Difficulty in walking, not elsewhere classified: Secondary | ICD-10-CM | POA: Insufficient documentation

## 2024-05-07 NOTE — Therapy (Signed)
 OUTPATIENT PHYSICAL THERAPY BALANCE TREATMENT  Patient Name: Roger Sanchez MRN: [id number] DOB:03-27-1946, 78 y.o., male Today's Date: 05/07/2024   PT End of Session - 05/07/24 0721     Visit Number 8    Number of Visits 12    Date for PT Re-Evaluation 05/07/24    PT Start Time 0721    PT Stop Time 0812    PT Time Calculation (min) 51 min    Equipment Utilized During Treatment Gait belt    Activity Tolerance Patient tolerated treatment well    Behavior During Therapy WFL for tasks assessed/performed         Past Medical History:  Diagnosis Date   Arthralgia    shoulder    Asthma    Asymmetrical hearing loss    Atypical chest pain    Brachial plexus disorders    Bradycardia    Carpal tunnel syndrome    Cerebral infarction (HCC)    Closed fracture of wrist    Cognitive communication deficit    Dysarthria and anarthria    Exposure to Agent Orange    GERD (gastroesophageal reflux disease)    Grade I diastolic dysfunction    HOH (hard of hearing)    bilateral   Hyperlipidemia    Hypertrophy of prostate    benign    [name] (HCC)    [name] (HCC)    Low back pain    Lumbar spine pain    compression fracture    Malignant neoplasm (HCC)    skin    Metatarsalgia    Osteopenia    Plantar fibromatosis    Secondary parkinsonism (HCC)    Stroke (HCC) 78 yrs old   no residual effects   SVT (supraventricular tachycardia) (HCC)    Syncope    Transient ischemic attack    Trigger finger    Past Surgical History:  Procedure Laterality Date   BACK SURGERY     BROW LIFT Bilateral 03/26/2021   Procedure: BLEPHAROPLASTY UPPER EYELID; W/EXCESS SKIN BROW PTOSIS REPAIR  BLEPHAROPTOSIS REPAIR; RESECT EX BILATERAL;  Surgeon: Ashley Greig HERO, MD;  Location: [facility];  Service: Ophthalmology;  Laterality: Bilateral;   CATARACT EXTRACTION W/PHACO Left 04/11/2024   Procedure: PHACOEMULSIFICATION, CATARACT, WITH IOL INSERTION  6.21  00:46.9;  Surgeon:  Enola Feliciano Hugger, MD;  Location: [facility];  Service: Ophthalmology;  Laterality: Left;   CATARACT EXTRACTION W/PHACO Right 04/25/2024   Procedure: PHACOEMULSIFICATION, CATARACT, WITH IOL INSERTION 7.21 00:46.2;  Surgeon: Enola Feliciano Hugger, MD;  Location: [facility];  Service: Ophthalmology;  Laterality: Right;   LOOP RECORDER REMOVAL N/A 06/06/2023   Procedure: LOOP RECORDER REMOVAL;  Surgeon: Fernande Elspeth BROCKS, MD;  Location: [facility];  Service: Cardiovascular;  Laterality: N/A;   PACEMAKER IMPLANT N/A 06/06/2023   Procedure: PACEMAKER IMPLANT;  Surgeon: Fernande Elspeth BROCKS, MD;  Location: [facility] CV LAB;  Service: Cardiovascular;  Laterality: N/A;   WRIST SURGERY Right    Patient Active Problem List   Diagnosis Date Noted   Lightheadedness 05/08/2023   Implantable loop recorder present 03/31/2023   NSVT (nonsustained ventricular tachycardia) (HCC) 02/13/2023   SVT (supraventricular tachycardia) - non sustained 02/13/2023   Near syncope 12/13/2022   [name] (HCC) 12/13/2022   Sinus bradycardia 12/13/2022   History of CVA (cerebrovascular accident) 12/13/2022   Hyperlipidemia 12/13/2022   Dyspnea on exertion 12/13/2022    PCP: Mitchell Bolt, MD  Referring Provider: Mitchell Bolt, MD  REFERRING PROVIDER:   REFERRING DIAGNOSIS:  Z74.09 (ICD-10-CM) - Declining mobility   THERAPY DIAG: Unsteadiness on feet  Other abnormalities of gait and mobility  Muscle weakness (generalized)  Difficulty in walking, not elsewhere classified  ONSET DATE: chronic  FOLLOW UP APPT WITH PROVIDER: PRN   RATIONALE FOR EVALUATION AND TREATMENT: Rehabilitation   SUBJECTIVE:                                                                                                                                                                                         Chief Complaint: [name] is a 78 year old male with Hx of [name] and  Parkinsonism.  Pt. Reports difficulty with stairs and recent falls reported.  Pt. States he is not walking as much and was walking 5 miles/day.    Pertinent History Pt is a 78 year old male with Hx of Lewy Body Demantia and Parkinsonism. Past medical history is significant for [name], TIA, CVA in 2013, asymmetrical hearing loss, dypsnea on exertion, anxiety, HLD, asthma. Pt wants to walk up to 5 miles per day; he walks 2-3 miles presently (pt walks around neighborhood streets mostly). Patient reports he did use walking stick, but using it has irritated his back (known arthritis per patient). Patient has diplopia and reports blurry vision at a distance. Pt likes to walk in woods and recently limited with descending stairs.    Pain: Yes; comorbid back pain/OA Numbness/Tingling: No Focal Weakness: No. Recent changes in overall health/medication: Yes; steadily worsening [name]  Prior history of physical therapy for balance:  Yes; see PT notes  Falls: Has patient fallen in last 6 months? Yes, Number of falls: 2 - fell on stairs in garage about 1 month ago/ fell the other day while putting pants on got tangled up Directional pattern for falls: Yes; falling forward when going down stairs and when he tripped one year ago   Imaging: Yes ;  CLINICAL DATA:  Transient ischemic attack (TIA). Neuro deficit, acute, stroke suspected.   Prior level of function: Independent Occupational demands: Retired Presenter, broadcasting: Walking routine; pt used to enjoy fishing   Red flags (bowel/bladder changes, saddle paresthesia, personal history of cancer, h/o spinal tumors, h/o compression fx, h/o abdominal aneurysm, abdominal pain, chills/fever, night sweats, nausea, vomiting, unrelenting pain): Negative  Precautions: Fall history, fall risk; pt has pacemaker  Weight Bearing Restrictions: No  Living Environment Lives with: lives with their spouse Lives in: House/apartment; 4-5 steps with rail on  R side going up.  lives in house 4-5 STE with rail remodeled bathroom with seat available (does not use) 1 level with bonus room -  12 steps with rail (where his bedroom is located) grandchildren local- enjoys playing horse basketball with them   Patient Goals: Maintain his level of function/independence    OBJECTIVE:   Patient Surveys  Berg: 54/56 ABC: 57% LEFS: 34 out of 80  Cognition Patient is oriented to person, place, and time.  Recent memory is intact.  Remote memory is moderately impaired.  Attention span and concentration are intact.  Expressive speech is intact, hypophonic.  Benefits from tx. In a quiet space Patient's fund of knowledge is within normal limits for educational level.    Gross Musculoskeletal Assessment/Observation Tremor: No resting tremor/intention tremor Bulk: Normal Tone: Hypotonic  Mask face, hypophonia.   GAIT: Distance walked: 470 ft  Assistive device utilized: None Level of assistance: SBA Comments: Good heel strike and velocity/cadence. Absent arm swing and rigid trunk. Ambulate outside on varying terrain/ parking lot.  Ascending/descending stairs  Posture: Rounded shoulder, forward head, trunk remains slightly flexed in sitting/standing    LE MMT:  MMT (out of 5) Right 03/26/2024 Left 03/26/2024  Hip flexion 5 5  Hip extension    Hip abduction (seated) 5 5  Hip adduction (seated) 5 5  Hip internal rotation    Hip external rotation    Knee flexion 5 5  Knee extension 5 5  Ankle dorsiflexion 5 5  Ankle plantarflexion    Ankle inversion    Ankle eversion    (* = pain; Blank rows = not tested)  Sensation Grossly intact to light touch bilateral LEs as determined by testing dermatomes L2-S2. Proprioception, and hot/cold testing deferred on this date.  Reflexes Deferred  Cranial Nerves Visual acuity and visual fields are intact  Extraocular muscles are intact  -pt exhibits convergence insufficiency  Facial sensation is  intact bilaterally  Facial strength is intact bilaterally  Hearing loss is mild as tested by gross conversation Palate elevates midline, normal phonation  Shoulder shrug strength is intact  Tongue protrudes slightly L    Coordination/Cerebellar Finger to Nose: Good Heel to Shin: Good Finger Opposition: Good   FUNCTIONAL OUTCOME MEASURES   Results Comments  BERG  Low fall risk          TUG NT   5TSTS 8.2 seconds Slight backward LOB during 5th rep, pt   (Blank rows = not tested)   FUNCTIONAL TASKS  Stairs: pt demonstrates reciprocal stair ascent with no handrail support, no loss of balance and sound toe clearance with each successive step Sit to stand: pt performs readily with no upper limb support, intermittently pushes posterior thighs on chair to assist and has one incident of mild posterior LOB upon attaining standing position   TODAY'S TREATMENT  05/07/2024  Subjective:  Pt. Reports he had a fall yesterday while walking. He tripped over a stake in his neighbor's yard and fell onto his R side. Pt reports mild pain, concerns that he might have broken a rib, all UE ROM full and pain-free with no bruising noted.  Therapeutic Exercise:  No charge  Nustep L4 B UE/LE 10 min. Discussed recent fall on the stake in yard yesterday.    There.act.:  Walking outside on uneven terrain, CGA from PT. Pt did not demonstrate any LOB during ambulation.   Large amplitude movements in seated with extended hold: Reaching down and to the side, reaching side to side with trunk rotation  Large amplitude movements in // bars: forward/ lateral lunges, rocking forward and backwards with added shoulder flexion, Sit to stand with no  UE assist (reach down to floor and then stand), step back with added DF on opposite leg, standing full body rotations to both sides with reaching.   33 min.    Navigated obstacle courses in // bars with CGA from PT and decreased amounts of UE support. Pt had to  negotiate 6 in and 12 in hurdles, stepping on/off an airex pad, stepping up onto and off of a 6 in step.   Walking outside up/down curb and over grassy/ uneven terrain with SBA/mod. I and focus on consistent hip/knee flexion and foot clearance.  No issues noted.    NOT TODAY: Walking in //-bars (5# ankle wts.):  6/12 hurdles with focus on hip flexion/ preventing circumduction.  Light UE assist required initially and progressing to no UE assist with recip. Gait pattern.    Walking in //-bars: Airex tandem pad step ups/down 5 laps.  Lateral walking with Airex tandem pad 5 laps.    Resisted gait 2BTB 7x all 4-planes in //-bars with no UE assist.  Improved BOS  Walking outside on stairs with focus on heel clearance.  Ascending/descending stairs with recip. Pattern and no UE assist.  SBA for cuing/ safety.    Star ex.: Blaze pods (6 pods)- SLS with pod touches 1 min. On L/R x 2.  Average 34 taps.    Blaze pod step touches (1st/2nd/3rd step).  Random taps 1 min. X 2 each on L/R.     Amb. In //-bars with 12 hurdle clearance.  Forward/ lateral with no UE assist.  Challenged more with lateral steps as compared to forward step overs.    STS from gray chair with no UE assist and added lunges with B shoulder flexion 10x on L/R.  No LOB and pt. Instructed to slow down.    BOSU step ups in //-bars with light UE and progressing to no UE assist.  Good ankle/LE control and pts. Confidence improved after several reps.   Seated 5#: LAQ/ marching 20x.  Standing heel raises 20x in //-bars. No UE assist needed.   Standing 3-way hip ex. (5# ankle wts)- cuing to no use UE to improve balance.  20x each.      PATIENT EDUCATION:  Education details: see above for patient education details Person educated: Patient Education method: Explanation Education comprehension: verbalized understanding   HOME EXERCISE PROGRAM: Reviewed currently HEP   ASSESSMENT:  CLINICAL IMPRESSION: Pt highly motivated and  works hard during tx. Session.  Primary focus on large amplitude exercises.  Pt shows improved form with all large amplitude exercises and required fewer cues than previous sessions, though he continues to require verbal and tactile cues to improve L shoulder extension, particularly with rotate and reach exercises. Pt demonstrated excellent balance both while ambulating on uneven terrain outdoors and while navigating obstacle courses in parallel bars, and did not experience any LOB in either scenario.  Pt would benefit from continued skilled PT to reduce his fall risk and improve BLE strength.   REHAB POTENTIAL: Good  CLINICAL DECISION MAKING: Unstable/unpredictable  EVALUATION COMPLEXITY: High   GOALS: Goals reviewed with patient? Yes  SHORT TERM GOALS: Target date: 04/16/2024  Pt will be independent with HEP in order to improve strength and balance in order to decrease fall risk and improve function at home. Baseline: initial eval: Formal HEP to be initiated at second follow-up visit.  Goal status: Partially met   LONG TERM GOALS: Target date: 05/07/2024  Pt will increase LEFS to >50 out of 80  to improve functional mobility/ decrease fall risk.        Baseline: 34 out of 80.  6/25: 46 out of 80 Goal status: Partially met  2.  Pt will increase ABC to >70% to improve confidence/ independence with walking and descending stairs.        Baseline: 57%.  6/24: 60.6% Goal status: Partially met  3.  Pt. Able to descend stairs with reciprocal pattern and consistent clearance of heel to decrease fall risk.        Baseline: recent fall descending stairs.  Extra time to complete/ consistently clear heel.        Goal status: Partially met   PLAN: PT FREQUENCY: 2x/week  PT DURATION: 6 weeks  PLANNED INTERVENTIONS: Therapeutic exercises, Therapeutic activity, Neuromuscular re-education, Balance training, Gait training, Patient/Family education, Joint manipulation, Joint mobilization, Canalith  repositioning, Aquatic Therapy, Dry Needling, Cognitive remediation, Electrical stimulation, Spinal manipulation, Spinal mobilization, Cryotherapy, Moist heat, Traction, Ultrasound, Ionotophoresis 4mg /ml Dexamethasone, and Manual therapy  PLAN FOR NEXT SESSION: Continue large amplitude exercises, stair training.  REASSESS GOALS/ RECERTIFICATION   Ozell JAYSON Sero, PT, DPT # [phone] Lorella Gomez, SPT 05/07/2024, 10:16 AM

## 2024-05-09 ENCOUNTER — Ambulatory Visit: Admitting: Physical Therapy

## 2024-05-09 DIAGNOSIS — R2689 Other abnormalities of gait and mobility: Secondary | ICD-10-CM

## 2024-05-09 DIAGNOSIS — R262 Difficulty in walking, not elsewhere classified: Secondary | ICD-10-CM

## 2024-05-09 DIAGNOSIS — R2681 Unsteadiness on feet: Secondary | ICD-10-CM | POA: Diagnosis not present

## 2024-05-09 DIAGNOSIS — M6281 Muscle weakness (generalized): Secondary | ICD-10-CM

## 2024-05-09 NOTE — Therapy (Addendum)
 OUTPATIENT PHYSICAL THERAPY BALANCE TREATMENT/ RECERTIFICATION  Patient Name: Roger Sanchez MRN: [id number] DOB:1946-08-12, 78 y.o., male Today's Date: 05/09/2024   PT End of Session - 05/09/24 0725     Visit Number 7    Number of Visits 11    Date for PT Re-Evaluation 07/04/24    PT Start Time 0731    PT Stop Time 0815    PT Time Calculation (min) 44 min    Equipment Utilized During Treatment Gait belt    Activity Tolerance Patient tolerated treatment well    Behavior During Therapy WFL for tasks assessed/performed         Past Medical History:  Diagnosis Date   Arthralgia    shoulder    Asthma    Asymmetrical hearing loss    Atypical chest pain    Brachial plexus disorders    Bradycardia    Carpal tunnel syndrome    Cerebral infarction (HCC)    Closed fracture of wrist    Cognitive communication deficit    Dysarthria and anarthria    Exposure to Agent Orange    GERD (gastroesophageal reflux disease)    Grade I diastolic dysfunction    HOH (hard of hearing)    bilateral   Hyperlipidemia    Hypertrophy of prostate    benign    [name] (HCC)    [name] (HCC)    Low back pain    Lumbar spine pain    compression fracture    Malignant neoplasm (HCC)    skin    Metatarsalgia    Osteopenia    Plantar fibromatosis    Secondary parkinsonism (HCC)    Stroke (HCC) 78 yrs old   no residual effects   SVT (supraventricular tachycardia) (HCC)    Syncope    Transient ischemic attack    Trigger finger    Past Surgical History:  Procedure Laterality Date   BACK SURGERY     BROW LIFT Bilateral 03/26/2021   Procedure: BLEPHAROPLASTY UPPER EYELID; W/EXCESS SKIN BROW PTOSIS REPAIR  BLEPHAROPTOSIS REPAIR; RESECT EX BILATERAL;  Surgeon: Ashley Greig HERO, MD;  Location: [facility];  Service: Ophthalmology;  Laterality: Bilateral;   CATARACT EXTRACTION W/PHACO Left 04/11/2024   Procedure: PHACOEMULSIFICATION, CATARACT, WITH IOL INSERTION  6.21   00:46.9;  Surgeon: Enola Feliciano Hugger, MD;  Location: [facility];  Service: Ophthalmology;  Laterality: Left;   CATARACT EXTRACTION W/PHACO Right 04/25/2024   Procedure: PHACOEMULSIFICATION, CATARACT, WITH IOL INSERTION 7.21 00:46.2;  Surgeon: Enola Feliciano Hugger, MD;  Location: [facility];  Service: Ophthalmology;  Laterality: Right;   LOOP RECORDER REMOVAL N/A 06/06/2023   Procedure: LOOP RECORDER REMOVAL;  Surgeon: Fernande Elspeth BROCKS, MD;  Location: [facility] IN[facility];  Service: Cardiovascular;  Laterality: N/A;   PACEMAKER IMPLANT N/A 06/06/2023   Procedure: PACEMAKER IMPLANT;  Surgeon: Fernande Elspeth BROCKS, MD;  Location: [facility] INVASIVE CV LAB;  Service: Cardiovascular;  Laterality: N/A;   WRIST SURGERY Right    Patient Active Problem List   Diagnosis Date Noted   Lightheadedness 05/08/2023   Implantable loop recorder present 03/31/2023   NSVT (nonsustained ventricular tachycardia) (HCC) 02/13/2023   SVT (supraventricular tachycardia) - non sustained 02/13/2023   Near syncope 12/13/2022   [name] (HCC) 12/13/2022   Sinus bradycardia 12/13/2022   History of CVA (cerebrovascular accident) 12/13/2022   Hyperlipidemia 12/13/2022   Dyspnea on exertion 12/13/2022    PCP: Mitchell Bolt, MD  Referring Provider: Mitchell Bolt, MD  REFERRING PROVIDER:   REFERRING DIAGNOSIS:  Z74.09 (ICD-10-CM) - Declining mobility   THERAPY DIAG: Unsteadiness on feet  Muscle weakness (generalized)  Difficulty in walking, not elsewhere classified  Other abnormalities of gait and mobility  ONSET DATE: chronic  FOLLOW UP APPT WITH PROVIDER: PRN   RATIONALE FOR EVALUATION AND TREATMENT: Rehabilitation   SUBJECTIVE:                                                                                                                                                                                         Chief Complaint: [name] is a 78 year old male with Hx of Lewy Body  Dementia and Parkinsonism.  Pt. Reports difficulty with stairs and recent falls reported.  Pt. States he is not walking as much and was walking 5 miles/day.    Pertinent History Pt is a 78 year old male with Hx of Lewy Body Demantia and Parkinsonism. Past medical history is significant for [name], TIA, CVA in 2013, asymmetrical hearing loss, dypsnea on exertion, anxiety, HLD, asthma. Pt wants to walk up to 5 miles per day; he walks 2-3 miles presently (pt walks around neighborhood streets mostly). Patient reports he did use walking stick, but using it has irritated his back (known arthritis per patient). Patient has diplopia and reports blurry vision at a distance. Pt likes to walk in woods and recently limited with descending stairs.    Pain: Yes; comorbid back pain/OA Numbness/Tingling: No Focal Weakness: No. Recent changes in overall health/medication: Yes; steadily worsening [name]  Prior history of physical therapy for balance:  Yes; see PT notes  Falls: Has patient fallen in last 6 months? Yes, Number of falls: 2 - fell on stairs in garage about 1 month ago/ fell the other day while putting pants on got tangled up Directional pattern for falls: Yes; falling forward when going down stairs and when he tripped one year ago   Imaging: Yes ;  CLINICAL DATA:  Transient ischemic attack (TIA). Neuro deficit, acute, stroke suspected.   Prior level of function: Independent Occupational demands: Retired Presenter, broadcasting: Walking routine; pt used to enjoy fishing   Red flags (bowel/bladder changes, saddle paresthesia, personal history of cancer, h/o spinal tumors, h/o compression fx, h/o abdominal aneurysm, abdominal pain, chills/fever, night sweats, nausea, vomiting, unrelenting pain): Negative  Precautions: Fall history, fall risk; pt has pacemaker  Weight Bearing Restrictions: No  Living Environment Lives with: lives with their spouse Lives in: House/apartment; 4-5 steps  with rail on R side going up.  lives in house 4-5 STE with rail remodeled bathroom with seat available (does not use) 1 level with bonus room -  12 steps with rail (where his bedroom is located) grandchildren local- enjoys playing horse basketball with them   Patient Goals: Maintain his level of function/independence    OBJECTIVE:   Patient Surveys  Berg: 54/56 ABC: 57% LEFS: 34 out of 80  Cognition Patient is oriented to person, place, and time.  Recent memory is intact.  Remote memory is moderately impaired.  Attention span and concentration are intact.  Expressive speech is intact, hypophonic.  Benefits from tx. In a quiet space Patient's fund of knowledge is within normal limits for educational level.    Gross Musculoskeletal Assessment/Observation Tremor: No resting tremor/intention tremor Bulk: Normal Tone: Hypotonic  Mask face, hypophonia.   GAIT: Distance walked: 470 ft  Assistive device utilized: None Level of assistance: SBA Comments: Good heel strike and velocity/cadence. Absent arm swing and rigid trunk. Ambulate outside on varying terrain/ parking lot.  Ascending/descending stairs  Posture: Rounded shoulder, forward head, trunk remains slightly flexed in sitting/standing    LE MMT:  MMT (out of 5) Right 03/26/2024 Left 03/26/2024  Hip flexion 5 5  Hip extension    Hip abduction (seated) 5 5  Hip adduction (seated) 5 5  Hip internal rotation    Hip external rotation    Knee flexion 5 5  Knee extension 5 5  Ankle dorsiflexion 5 5  Ankle plantarflexion    Ankle inversion    Ankle eversion    (* = pain; Blank rows = not tested)  Sensation Grossly intact to light touch bilateral LEs as determined by testing dermatomes L2-S2. Proprioception, and hot/cold testing deferred on this date.  Reflexes Deferred  Cranial Nerves Visual acuity and visual fields are intact  Extraocular muscles are intact  -pt exhibits convergence insufficiency  Facial  sensation is intact bilaterally  Facial strength is intact bilaterally  Hearing loss is mild as tested by gross conversation Palate elevates midline, normal phonation  Shoulder shrug strength is intact  Tongue protrudes slightly L    Coordination/Cerebellar Finger to Nose: Good Heel to Shin: Good Finger Opposition: Good   FUNCTIONAL OUTCOME MEASURES   Results Comments  BERG  Low fall risk          TUG NT   5TSTS 8.2 seconds Slight backward LOB during 5th rep, pt   (Blank rows = not tested)   FUNCTIONAL TASKS  Stairs: pt demonstrates reciprocal stair ascent with no handrail support, no loss of balance and sound toe clearance with each successive step Sit to stand: pt performs readily with no upper limb support, intermittently pushes posterior thighs on chair to assist and has one incident of mild posterior LOB upon attaining standing position   TODAY'S TREATMENT  05/09/2024  Subjective:  Pt. Reports no more falls since last appointment. Pt reports he's been walking a lot.   Therapeutic Exercise:  No charge   No Nustep today.   Pt. Warmed up with a walking around the [facility] campus prior to tx.    There.act.:  DGI: 21/24  Observed pt's stair mechanics. Pt ascended stairs with step-through pattern, used B handrails, no LOB. Pt descended stairs with a step through pattern, use of B handrails, no heel catching or LOB. However, pt expressed ongoing fear of descending stairs after having a fall while descending stairs 1-2 months ago.   1x10 toe taps on 6 in step, RPE 2/10 1x10 B step overs on 6 in step in // bars, RPE: 2-3/10 1x10 B SL heel taps standing on 6 in step in //  bars, RPE: 2-3/10  1x10 B forward step downs from 6 in step in // bars, RPE: 3-4/10 2x10 B step ups on 12 in step, SBA, RPE: 5/10   Discussed appt frequency. Pt requested to reduce frequency to 1x/wk to reduce the number of appointments he and his wife have to go to.    NOT TODAY: Walking in //-bars  (5# ankle wts.):  6/12 hurdles with focus on hip flexion/ preventing circumduction.  Light UE assist required initially and progressing to no UE assist with recip. Gait pattern.    Walking in //-bars: Airex tandem pad step ups/down 5 laps.  Lateral walking with Airex tandem pad 5 laps.    Resisted gait 2BTB 7x all 4-planes in //-bars with no UE assist.  Improved BOS  Walking outside on stairs with focus on heel clearance.  Ascending/descending stairs with recip. Pattern and no UE assist.  SBA for cuing/ safety.    Star ex.: Blaze pods (6 pods)- SLS with pod touches 1 min. On L/R x 2.  Average 34 taps.    Blaze pod step touches (1st/2nd/3rd step).  Random taps 1 min. X 2 each on L/R.     Amb. In //-bars with 12 hurdle clearance.  Forward/ lateral with no UE assist.  Challenged more with lateral steps as compared to forward step overs.    STS from gray chair with no UE assist and added lunges with B shoulder flexion 10x on L/R.  No LOB and pt. Instructed to slow down.    BOSU step ups in //-bars with light UE and progressing to no UE assist.  Good ankle/LE control and pts. Confidence improved after several reps.   Seated 5#: LAQ/ marching 20x.  Standing heel raises 20x in //-bars.   Standing 3-way hip ex. (5# ankle wts)- cuing to no use UE to improve balance.  20x each.      Large amplitude exercises  PATIENT EDUCATION:  Education details: see above for patient education details Person educated: Patient Education method: Explanation Education comprehension: verbalized understanding   HOME EXERCISE PROGRAM: Reviewed currently HEP   ASSESSMENT:  CLINICAL IMPRESSION: Pt highly motivated and works hard during tx. Session. Today's tx session focused on re-assessing pt goals. Pt scored 21/24 on the DGI, indicating that he is not at an increased risk of falls. Pt only demonstrated mild deviation from straight path with right head turn, increased time to pivot 180 deg, and uses rails  when ascending and descending stairs, no LoB noted at any point during the test. Descending stairs was the only task that pt reported feeling uncertain in his balance, specifically due to his trouble with depth perception, although pt did not experience difficulty descending stairs in the clinic today. Pt reported he frequently misses the bottom stair, but that this has improved since putting high-contrast reflective tape on the edge of the last two stairs.  Pt would benefit from continued skilled PT to reduce his fall risk and improve confidence with descending stairs.    REHAB POTENTIAL: Good  CLINICAL DECISION MAKING: Unstable/unpredictable  EVALUATION COMPLEXITY: High   GOALS: Goals reviewed with patient? Yes  LONG TERM GOALS: Target date: 07/04/2024  Pt will increase LEFS to >50 out of 80 to improve functional mobility/ decrease fall risk.        Baseline: 34 out of 80.  6/25: 46 out of 80 Goal status: Partially met  2.  Pt will increase ABC to >70% to improve confidence/ independence with walking and  descending stairs.        Baseline: 57%.  6/24: 60.6% Goal status: Partially met  3.  Pt. Able to descend stairs with reciprocal pattern and consistent clearance of heel to decrease fall risk.        Baseline: recent fall descending stairs.  Extra time to complete/ consistently clear heel. 7/3: goal met, pt still has concerns about his ability to safely descend stairs        Goal status: Partially met  4.  Pt. Able to walk in woods around house with no reports of loss of balance/ falls to improve independence and safety.         Baseline: fear of falling while walking in woods/ uneven terrain       Goal status: Initial    PLAN: PT FREQUENCY: biweekly   PT DURATION: 8 weeks  PLANNED INTERVENTIONS: Therapeutic exercises, Therapeutic activity, Neuromuscular re-education, Balance training, Gait training, Patient/Family education, Joint manipulation, Joint mobilization, Canalith  repositioning, Aquatic Therapy, Dry Needling, Cognitive remediation, Electrical stimulation, Spinal manipulation, Spinal mobilization, Cryotherapy, Moist heat, Traction, Ultrasound, Ionotophoresis 4mg /ml Dexamethasone, and Manual therapy  PLAN FOR NEXT SESSION: Continue stair training   Ozell JAYSON Sero, PT, DPT # [phone] Antonyo Hinderer, SPT 05/09/2024, 11:44 AM

## 2024-05-14 ENCOUNTER — Ambulatory Visit: Admitting: Physical Therapy

## 2024-05-29 ENCOUNTER — Encounter: Payer: Self-pay | Admitting: Physical Therapy

## 2024-05-29 ENCOUNTER — Ambulatory Visit: Admitting: Physical Therapy

## 2024-05-29 DIAGNOSIS — R2689 Other abnormalities of gait and mobility: Secondary | ICD-10-CM

## 2024-05-29 DIAGNOSIS — R262 Difficulty in walking, not elsewhere classified: Secondary | ICD-10-CM

## 2024-05-29 DIAGNOSIS — R2681 Unsteadiness on feet: Secondary | ICD-10-CM

## 2024-05-29 DIAGNOSIS — M6281 Muscle weakness (generalized): Secondary | ICD-10-CM

## 2024-05-29 NOTE — Therapy (Signed)
 OUTPATIENT PHYSICAL THERAPY BALANCE TREATMENT  Patient Name: Roger Sanchez MRN: [id number] DOB:13-Apr-1946, 78 y.o., male Today's Date: 05/29/2024   PT End of Session - 05/29/24 0719     Visit Number 8    Number of Visits 11    Date for PT Re-Evaluation 07/04/24    PT Start Time 0724    PT Stop Time 0811    PT Time Calculation (min) 47 min    Equipment Utilized During Treatment Gait belt    Activity Tolerance Patient tolerated treatment well    Behavior During Therapy WFL for tasks assessed/performed         Past Medical History:  Diagnosis Date   Arthralgia    shoulder    Asthma    Asymmetrical hearing loss    Atypical chest pain    Brachial plexus disorders    Bradycardia    Carpal tunnel syndrome    Cerebral infarction (HCC)    Closed fracture of wrist    Cognitive communication deficit    Dysarthria and anarthria    Exposure to Agent Orange    GERD (gastroesophageal reflux disease)    Grade I diastolic dysfunction    HOH (hard of hearing)    bilateral   Hyperlipidemia    Hypertrophy of prostate    benign    [name] (HCC)    [name] (HCC)    Low back pain    Lumbar spine pain    compression fracture    Malignant neoplasm (HCC)    skin    Metatarsalgia    Osteopenia    Plantar fibromatosis    Secondary parkinsonism (HCC)    Stroke (HCC) 78 yrs old   no residual effects   SVT (supraventricular tachycardia) (HCC)    Syncope    Transient ischemic attack    Trigger finger    Past Surgical History:  Procedure Laterality Date   BACK SURGERY     BROW LIFT Bilateral 03/26/2021   Procedure: BLEPHAROPLASTY UPPER EYELID; W/EXCESS SKIN BROW PTOSIS REPAIR  BLEPHAROPTOSIS REPAIR; RESECT EX BILATERAL;  Surgeon: Ashley Greig HERO, MD;  Location: [facility];  Service: Ophthalmology;  Laterality: Bilateral;   CATARACT EXTRACTION W/PHACO Left 04/11/2024   Procedure: PHACOEMULSIFICATION, CATARACT, WITH IOL INSERTION  6.21  00:46.9;  Surgeon:  Enola Feliciano Hugger, MD;  Location: [facility];  Service: Ophthalmology;  Laterality: Left;   CATARACT EXTRACTION W/PHACO Right 04/25/2024   Procedure: PHACOEMULSIFICATION, CATARACT, WITH IOL INSERTION 7.21 00:46.2;  Surgeon: Enola Feliciano Hugger, MD;  Location: [facility];  Service: Ophthalmology;  Laterality: Right;   LOOP RECORDER REMOVAL N/A 06/06/2023   Procedure: LOOP RECORDER REMOVAL;  Surgeon: Fernande Elspeth BROCKS, MD;  Location: [facility] INVASIVE CV LAB;  Service: Cardiovascular;  Laterality: N/A;   PACEMAKER IMPLANT N/A 06/06/2023   Procedure: PACEMAKER IMPLANT;  Surgeon: Fernande Elspeth BROCKS, MD;  Location: [facility] INVASIVE CV LAB;  Service: Cardiovascular;  Laterality: N/A;   WRIST SURGERY Right    Patient Active Problem List   Diagnosis Date Noted   Lightheadedness 05/08/2023   Implantable loop recorder present 03/31/2023   NSVT (nonsustained ventricular tachycardia) (HCC) 02/13/2023   SVT (supraventricular tachycardia) - non sustained 02/13/2023   Near syncope 12/13/2022   [name] (HCC) 12/13/2022   Sinus bradycardia 12/13/2022   History of CVA (cerebrovascular accident) 12/13/2022   Hyperlipidemia 12/13/2022   Dyspnea on exertion 12/13/2022    PCP: Mitchell Bolt, MD  Referring Provider: Mitchell Bolt, MD  REFERRING PROVIDER:   REFERRING DIAGNOSIS:  Z74.09 (ICD-10-CM) - Declining mobility   THERAPY DIAG: Unsteadiness on feet  Other abnormalities of gait and mobility  Muscle weakness (generalized)  Difficulty in walking, not elsewhere classified  ONSET DATE: chronic  FOLLOW UP APPT WITH PROVIDER: PRN   RATIONALE FOR EVALUATION AND TREATMENT: Rehabilitation   SUBJECTIVE:                                                                                                                                                                                         Chief Complaint: [name] is a 78 year old male with Hx of [name] and  Parkinsonism.  Pt. Reports difficulty with stairs and recent falls reported.  Pt. States he is not walking as much and was walking 5 miles/day.    Pertinent History Pt is a 78 year old male with Hx of Lewy Body Demantia and Parkinsonism. Past medical history is significant for [name], TIA, CVA in 2013, asymmetrical hearing loss, dypsnea on exertion, anxiety, HLD, asthma. Pt wants to walk up to 5 miles per day; he walks 2-3 miles presently (pt walks around neighborhood streets mostly). Patient reports he did use walking stick, but using it has irritated his back (known arthritis per patient). Patient has diplopia and reports blurry vision at a distance. Pt likes to walk in woods and recently limited with descending stairs.    Pain: Yes; comorbid back pain/OA Numbness/Tingling: No Focal Weakness: No. Recent changes in overall health/medication: Yes; steadily worsening [name]  Prior history of physical therapy for balance:  Yes; see PT notes  Falls: Has patient fallen in last 6 months? Yes, Number of falls: 2 - fell on stairs in garage about 1 month ago/ fell the other day while putting pants on got tangled up Directional pattern for falls: Yes; falling forward when going down stairs and when he tripped one year ago   Imaging: Yes ;  CLINICAL DATA:  Transient ischemic attack (TIA). Neuro deficit, acute, stroke suspected.   Prior level of function: Independent Occupational demands: Retired Presenter, broadcasting: Walking routine; pt used to enjoy fishing   Red flags (bowel/bladder changes, saddle paresthesia, personal history of cancer, h/o spinal tumors, h/o compression fx, h/o abdominal aneurysm, abdominal pain, chills/fever, night sweats, nausea, vomiting, unrelenting pain): Negative  Precautions: Fall history, fall risk; pt has pacemaker  Weight Bearing Restrictions: No  Living Environment Lives with: lives with their spouse Lives in: House/apartment; 4-5 steps with rail on  R side going up.  lives in house 4-5 STE with rail remodeled bathroom with seat available (does not use) 1 level with bonus room -  12 steps with rail (where his bedroom is located) grandchildren local- enjoys playing horse basketball with them   Patient Goals: Maintain his level of function/independence    OBJECTIVE:   Patient Surveys  Berg: 54/56 ABC: 57% LEFS: 34 out of 80  Cognition Patient is oriented to person, place, and time.  Recent memory is intact.  Remote memory is moderately impaired.  Attention span and concentration are intact.  Expressive speech is intact, hypophonic.  Benefits from tx. In a quiet space Patient's fund of knowledge is within normal limits for educational level.    Gross Musculoskeletal Assessment/Observation Tremor: No resting tremor/intention tremor Bulk: Normal Tone: Hypotonic  Mask face, hypophonia.   GAIT: Distance walked: 470 ft  Assistive device utilized: None Level of assistance: SBA Comments: Good heel strike and velocity/cadence. Absent arm swing and rigid trunk. Ambulate outside on varying terrain/ parking lot.  Ascending/descending stairs  Posture: Rounded shoulder, forward head, trunk remains slightly flexed in sitting/standing    LE MMT:  MMT (out of 5) Right 03/26/2024 Left 03/26/2024  Hip flexion 5 5  Hip extension    Hip abduction (seated) 5 5  Hip adduction (seated) 5 5  Hip internal rotation    Hip external rotation    Knee flexion 5 5  Knee extension 5 5  Ankle dorsiflexion 5 5  Ankle plantarflexion    Ankle inversion    Ankle eversion    (* = pain; Blank rows = not tested)  Sensation Grossly intact to light touch bilateral LEs as determined by testing dermatomes L2-S2. Proprioception, and hot/cold testing deferred on this date.  Reflexes Deferred  Cranial Nerves Visual acuity and visual fields are intact  Extraocular muscles are intact  -pt exhibits convergence insufficiency  Facial sensation is  intact bilaterally  Facial strength is intact bilaterally  Hearing loss is mild as tested by gross conversation Palate elevates midline, normal phonation  Shoulder shrug strength is intact  Tongue protrudes slightly L    Coordination/Cerebellar Finger to Nose: Good Heel to Shin: Good Finger Opposition: Good   FUNCTIONAL OUTCOME MEASURES   Results Comments  BERG  Low fall risk          TUG NT   5TSTS 8.2 seconds Slight backward LOB during 5th rep, pt   (Blank rows = not tested)   FUNCTIONAL TASKS  Stairs: pt demonstrates reciprocal stair ascent with no handrail support, no loss of balance and sound toe clearance with each successive step Sit to stand: pt performs readily with no upper limb support, intermittently pushes posterior thighs on chair to assist and has one incident of mild posterior LOB upon attaining standing position  DGI: 21/24  TODAY'S TREATMENT  05/29/2024  Subjective:  Pt. Reports no new complaints.  No falls reported since last tx. Session and pt. Is getting out early to walk. Pt reports he does planks, bicep curls, overhead press, and squats with a chair at home. He also walks 3 miles in the mornings in his neighborhood with a trekking pole.    There.act.:  Walking outside with gait belt and focus on navigating uneven terrain, CGA 7 min  3 laps obstacle course that involved stepping on/off 6 in step, standing on airex pad, LE cone taps, and stepping over 3 6 in hurdles in a reciprocal pattern, CGA  In // bars with 3# ankle weights:  1x30 sec step taps onto 6 in step 4x30 sec step taps onto 6 in step and red TB around knees  1x10 Step downs  2x10 Step downs from 6in step w/ BUE support 1x10 Step downs from 6in step w/o UE support  2x10 heel taps from 6 in step with 2 finger UE support    Therex:  1x10 standing hip abductions with red TB around ankles 1x10 Paloff presses with green TB  1x10 scap retractions with green TB    Reveiwed/ added to  HEP   NOT TODAY: Walking in //-bars (5# ankle wts.):  6/12 hurdles with focus on hip flexion/ preventing circumduction.  Light UE assist required initially and progressing to no UE assist with recip. Gait pattern.    Walking in //-bars: Airex tandem pad step ups/down 5 laps.  Lateral walking with Airex tandem pad 5 laps.    Resisted gait 2BTB 7x all 4-planes in //-bars with no UE assist.  Improved BOS  Walking outside on stairs with focus on heel clearance.  Ascending/descending stairs with recip. Pattern and no UE assist.  SBA for cuing/ safety.    Star ex.: Blaze pods (6 pods)- SLS with pod touches 1 min. On L/R x 2.  Average 34 taps.    Blaze pod step touches (1st/2nd/3rd step).  Random taps 1 min. X 2 each on L/R.     Amb. In //-bars with 12 hurdle clearance.  Forward/ lateral with no UE assist.  Challenged more with lateral steps as compared to forward step overs.    STS from gray chair with no UE assist and added lunges with B shoulder flexion 10x on L/R.  No LOB and pt. Instructed to slow down.    BOSU step ups in //-bars with light UE and progressing to no UE assist.  Good ankle/LE control and pts. Confidence improved after several reps.   Seated 5#: LAQ/ marching 20x.  Standing heel raises 20x in //-bars.   Standing 3-way hip ex. (5# ankle wts)- cuing to no use UE to improve balance.  20x each.      Large amplitude exercises  PATIENT EDUCATION:  Education details: see above for patient education details Person educated: Patient Education method: Explanation Education comprehension: verbalized understanding   HOME EXERCISE PROGRAM: Access Code: [email or web address] URL: [email or web address] Date: 05/29/2024 Prepared by: Ozell Sero Exercises - Standing Hip Abduction with Resistance at Ankles and Counter Support - 1 x daily - 4 x weekly - 2 sets - 10 reps - Standing Anti-Rotation Press with Anchored Resistance - 1 x daily - 4 x weekly - 2 sets - 10 reps - Scapular  Retraction with Resistance - 1 x daily - 4 x weekly - 2 sets - 10 reps    ASSESSMENT:  CLINICAL IMPRESSION: Pt highly motivated and works hard during tx. Session. Today's tx session focused on navigating uneven terrain and stairs. Pt experienced 2 LOB 2x with some SL activities (hurdles, cone taps), was righted by PT. Pt required verbal cues to increase step height with step taps, only kicked step 1x with verbal cues to focus on step height over speed. Pt continues to be highly motivated and work hard during treatment sessions. Pt would benefit from continued skilled PT to reduce his fall risk and improve confidence with descending stairs.    REHAB POTENTIAL: Good  CLINICAL DECISION MAKING: Unstable/unpredictable  EVALUATION COMPLEXITY: High   GOALS: Goals reviewed with patient? Yes  LONG TERM GOALS: Target date: 07/04/2024  Pt will increase LEFS to >50 out of 80 to improve functional mobility/ decrease fall risk.        Baseline:  34 out of 80.  6/25: 46 out of 80 Goal status: Partially met  2.  Pt will increase ABC to >70% to improve confidence/ independence with walking and descending stairs.        Baseline: 57%.  6/24: 60.6% Goal status: Partially met  3.  Pt. Able to descend stairs with reciprocal pattern and consistent clearance of heel to decrease fall risk.        Baseline: recent fall descending stairs.  Extra time to complete/ consistently clear heel. 7/3: goal met, pt still has concerns about his ability to safely descend stairs        Goal status: Partially met  4.  Pt. Able to walk in woods around house with no reports of loss of balance/ falls to improve independence and safety.         Baseline: fear of falling while walking in woods/ uneven terrain       Goal status: Initial    PLAN: PT FREQUENCY: biweekly   PT DURATION: 8 weeks  PLANNED INTERVENTIONS: Therapeutic exercises, Therapeutic activity, Neuromuscular re-education, Balance training, Gait training,  Patient/Family education, Joint manipulation, Joint mobilization, Canalith repositioning, Aquatic Therapy, Dry Needling, Cognitive remediation, Electrical stimulation, Spinal manipulation, Spinal mobilization, Cryotherapy, Moist heat, Traction, Ultrasound, Ionotophoresis 4mg /ml Dexamethasone, and Manual therapy  PLAN FOR NEXT SESSION: Continue stair training   Ozell JAYSON Sero, PT, DPT # [phone] Sophiya Morello, SPT 05/29/2024, 12:17 PM

## 2024-06-04 ENCOUNTER — Ambulatory Visit: Payer: No Typology Code available for payment source

## 2024-06-04 DIAGNOSIS — R001 Bradycardia, unspecified: Secondary | ICD-10-CM

## 2024-06-05 LAB — CUP PACEART REMOTE DEVICE CHECK
Battery Remaining Longevity: 152 mo
Battery Voltage: 3.04 V
Brady Statistic AP VP Percent: 0.21 %
Brady Statistic AP VS Percent: 81.99 %
Brady Statistic AS VP Percent: 0.01 %
Brady Statistic AS VS Percent: 17.79 %
Brady Statistic RA Percent Paced: 82.82 %
Brady Statistic RV Percent Paced: 0.22 %
Date Time Interrogation Session: 20250728205254
Implantable Lead Connection Status: 753985
Implantable Lead Connection Status: 753985
Implantable Lead Implant Date: 20240730
Implantable Lead Implant Date: 20240730
Implantable Lead Location: 753859
Implantable Lead Location: 753860
Implantable Lead Model: 5076
Implantable Lead Model: 5076
Implantable Pulse Generator Implant Date: 20240730
Lead Channel Impedance Value: 323 Ohm
Lead Channel Impedance Value: 361 Ohm
Lead Channel Impedance Value: 418 Ohm
Lead Channel Impedance Value: 494 Ohm
Lead Channel Pacing Threshold Amplitude: 0.5 V
Lead Channel Pacing Threshold Amplitude: 0.875 V
Lead Channel Pacing Threshold Pulse Width: 0.4 ms
Lead Channel Pacing Threshold Pulse Width: 0.4 ms
Lead Channel Sensing Intrinsic Amplitude: 1.125 mV
Lead Channel Sensing Intrinsic Amplitude: 1.125 mV
Lead Channel Sensing Intrinsic Amplitude: 12 mV
Lead Channel Sensing Intrinsic Amplitude: 12 mV
Lead Channel Setting Pacing Amplitude: 1.75 V
Lead Channel Setting Pacing Amplitude: 2 V
Lead Channel Setting Pacing Pulse Width: 0.4 ms
Lead Channel Setting Sensing Sensitivity: 1.2 mV
Zone Setting Status: 755011

## 2024-06-06 ENCOUNTER — Ambulatory Visit: Payer: Self-pay | Admitting: Cardiology

## 2024-06-09 DIAGNOSIS — I639 Cerebral infarction, unspecified: Secondary | ICD-10-CM | POA: Insufficient documentation

## 2024-06-09 DIAGNOSIS — Z950 Presence of cardiac pacemaker: Secondary | ICD-10-CM | POA: Insufficient documentation

## 2024-06-09 DIAGNOSIS — Z5181 Encounter for therapeutic drug level monitoring: Secondary | ICD-10-CM | POA: Insufficient documentation

## 2024-06-09 DIAGNOSIS — I498 Other specified cardiac arrhythmias: Secondary | ICD-10-CM | POA: Insufficient documentation

## 2024-06-09 NOTE — Progress Notes (Unsigned)
 Electrophysiology Clinic Note    Date:  06/10/2024  Patient ID:  Roger Sanchez, Roger Sanchez [birth year], MRN [id number] PCP:  Cena Simpers, MD  Cardiologist:  None   Electrophysiologist:  Dr. Fernande > OLE ONEIDA HOLTS, MD  Electrophysiology APP:  Anari Evitt, NP     Discussed the use of AI scribe software for clinical note transcription with the patient, who gave verbal consent to proceed.   Patient Profile    Chief Complaint: flecainide , PPM follow-up  History of Present Illness: Roger Sanchez is a 78 y.o. male with PMH notable for SVT, NSVT, [name], bradycardia with chronotropic incompetence s/p PPM, PVCs, PACs, CVA; seen today for OLE ONEIDA HOLTS, MD for routine electrophysiology followup.   He last saw Dr. Fernande 08/2023 for routine 59-month follow-up after pacemaker implantation.  He had no further lightheadedness and had increased exercise tolerance since implantation of pacemaker.   He was seen by PA Dunn 03/2024 after calling office w LH, dizziness, hypotension consistent with orthostatic hypotension. Recommended updated TTE that revealed slightly worsened aortic stenosis.   He had a recent fall on 7/25 requiring stitches to his L brow and eyelid.  He continues to experience dizziness when changing positions. He has also noticed increased fatigue during physical activity, often cutting walks short due to tiredness without shortness of breath. No chest pain, chest pressure, palpitations.  He continues to take flecainide  25mg  BID.   His wife joins for appts. They have been working on increasing hydration.     Arrhythmia/Device History MDT dual chamber PPM, imp 05/2023; dx SND  MDT ILR - explanted    AAD -  Flecainide  (without AVN blocking agent)    ROS:  Please see the history of present illness. All other systems are reviewed and otherwise negative.    Physical Exam    VS:  BP 120/70 (BP Location: Left Arm, Patient Position: Sitting, Cuff  Size: Normal)   Pulse 66   Ht 5' 6 (1.676 m)   Wt 161 lb 3.2 oz (73.1 kg)   SpO2 98%   BMI 26.02 kg/m  BMI: Body mass index is 26.02 kg/m.  Orthostatic VS for the past 24 hrs (Last 3 readings):  BP- Lying Pulse- Lying BP- Sitting Pulse- Sitting BP- Standing at 0 minutes Pulse- Standing at 0 minutes BP- Standing at 3 minutes Pulse- Standing at 3 minutes  06/10/24 1042 113/72 67 106/69 65 (!) 86/53 66 96/63 67     Wt Readings from Last 3 Encounters:  06/10/24 161 lb 3.2 oz (73.1 kg)  04/25/24 163 lb 8 oz (74.2 kg)  04/11/24 166 lb 12.8 oz (75.7 kg)     GEN- The patient is well appearing, alert and oriented x 3 today.   Lungs- Clear to ausculation bilaterally, normal work of breathing.  Heart- Regular rate and rhythm, no murmurs, rubs or gallops Extremities- No peripheral edema, warm, dry Skin-  device pocket well-healed, no tethering   Device interrogation done today and reviewed by myself:  Battery 12.6 years Lead thresholds, impedence, sensing stable  Low VP High AP Histograms blunted at LRL No episodes Adjusted activity acceleration from 30s > 15s No further changes made today   Studies Reviewed   Previous EP, cardiology notes.    EKG is not ordered. Personal review of EKG from 03/18/2024 shows:  AP-VS at 61bpm; PR        TTE, 04/02/2024  1. Left ventricular ejection fraction, by estimation, is 60 to  65%. The left ventricle has normal function. The left ventricle has no regional wall motion abnormalities. Left ventricular diastolic parameters are consistent with Grade I diastolic dysfunction (impaired relaxation).   2. Right ventricular systolic function is normal. The right ventricular size is normal.   3. Left atrial size was mild to moderately dilated.   4. The mitral valve is normal in structure. No evidence of mitral valve regurgitation.   5. Mean Aov gradient , PG , AVA 1.5cm2. The aortic valve is tricuspid. Aortic valve regurgitation is  not visualized. Mild to moderate aortic valve stenosis.   Coronary CTA, 02/15/2023 1. Coronary calcium  score of 754. This was 73rd percentile for age and sex matched control.  2. Normal coronary origin with right dominance.  3. Mild proximal LAD and LCx stenosis (25-49%).  4. Minimal proximal LCx stenosis (<25%).  5. CAD-RADS 2. Mild non-obstructive CAD (25-49%). Consider preventive therapy and risk factor modification.   Assessment and Plan     #) bradycardia w chronotropic incompetence #) PPM in situ Histograms blunted at [facility], patient having fatigue with exercise Adjusted activity acceleration. Patient ambulated around clinic with HR rising to mid 80s.   #) SVT #) NSVT #) flecainide  monitoring No arrhythmias on device Most recent EKG with stable intervals on low dose flecainide  Update BMP, mag  #) orthostatic hypotension Profoundly positive in clinic.  Encouraged increased hydration and salt Discussed slowly transitioning positions to allow BP to recover Consider bedside commode, bedside urinal  #) cryptogenic stroke No afib on PPM       Current medicines are reviewed at length with the patient today.   The patient does not have concerns regarding his medicines.  The following changes were made today:  none  Labs/ tests ordered today include:  Orders Placed This Encounter  Procedures   Basic metabolic panel with GFR   Magnesium     Disposition: Follow up with Dr. Cindie or EP APP in 6 months, prefer MD to establish care with new MD   Signed, Chantal Needle, NP  06/10/24  12:17 PM  Electrophysiology [facility]

## 2024-06-10 ENCOUNTER — Ambulatory Visit: Payer: Self-pay | Admitting: Cardiology

## 2024-06-10 ENCOUNTER — Ambulatory Visit: Attending: Cardiology | Admitting: Cardiology

## 2024-06-10 VITALS — BP 120/70 | HR 66 | Ht 66.0 in | Wt 161.2 lb

## 2024-06-10 DIAGNOSIS — Z79899 Other long term (current) drug therapy: Secondary | ICD-10-CM

## 2024-06-10 DIAGNOSIS — I471 Supraventricular tachycardia, unspecified: Secondary | ICD-10-CM

## 2024-06-10 DIAGNOSIS — I4729 Other ventricular tachycardia: Secondary | ICD-10-CM | POA: Diagnosis not present

## 2024-06-10 DIAGNOSIS — Z950 Presence of cardiac pacemaker: Secondary | ICD-10-CM | POA: Diagnosis not present

## 2024-06-10 DIAGNOSIS — I498 Other specified cardiac arrhythmias: Secondary | ICD-10-CM | POA: Diagnosis not present

## 2024-06-10 DIAGNOSIS — I639 Cerebral infarction, unspecified: Secondary | ICD-10-CM

## 2024-06-10 DIAGNOSIS — R001 Bradycardia, unspecified: Secondary | ICD-10-CM | POA: Diagnosis not present

## 2024-06-10 DIAGNOSIS — Z5181 Encounter for therapeutic drug level monitoring: Secondary | ICD-10-CM

## 2024-06-10 LAB — CUP PACEART INCLINIC DEVICE CHECK
Date Time Interrogation Session: 20250804122407
Implantable Lead Connection Status: 753985
Implantable Lead Connection Status: 753985
Implantable Lead Implant Date: 20240730
Implantable Lead Implant Date: 20240730
Implantable Lead Location: 753859
Implantable Lead Location: 753860
Implantable Lead Model: 5076
Implantable Lead Model: 5076
Implantable Pulse Generator Implant Date: 20240730

## 2024-06-10 NOTE — Patient Instructions (Signed)
 Medication Instructions:  The current medical regimen is effective;  continue present plan and medications as directed. Please refer to the Current Medication list given to you today.   *If you need a refill on your cardiac medications before your next appointment, please call your pharmacy*  Lab Work: Your provider would like for you to have following labs drawn today BMET, MAG.   If you have labs (blood work) drawn today and your tests are completely normal, you will receive your results only by: MyChart Message (if you have MyChart) OR A paper copy in the mail If you have any lab test that is abnormal or we need to change your treatment, we will call you to review the results.  Follow-Up: At [facility], you and your health needs are our priority.  As part of our continuing mission to provide you with exceptional heart care, our providers are all part of one team.  This team includes your primary Cardiologist (physician) and Advanced Practice Providers or APPs (Physician Assistants and Nurse Practitioners) who all work together to provide you with the care you need, when you need it.  Your next appointment:   6 month(s)  Provider:   Ole Holts, MD or Suzann Riddle, NP    We recommend signing up for the patient portal called MyChart.  Sign up information is provided on this After Visit Summary.  MyChart is used to connect with patients for Virtual Visits (Telemedicine).  Patients are able to view lab/test results, encounter notes, upcoming appointments, etc.  Non-urgent messages can be sent to your provider as well.   To learn more about what you can do with MyChart, go to [email or web address].

## 2024-06-11 ENCOUNTER — Ambulatory Visit: Attending: Family Medicine | Admitting: Physical Therapy

## 2024-06-11 ENCOUNTER — Encounter: Payer: Self-pay | Admitting: Physical Therapy

## 2024-06-11 DIAGNOSIS — R2689 Other abnormalities of gait and mobility: Secondary | ICD-10-CM | POA: Insufficient documentation

## 2024-06-11 DIAGNOSIS — M6281 Muscle weakness (generalized): Secondary | ICD-10-CM | POA: Insufficient documentation

## 2024-06-11 DIAGNOSIS — R262 Difficulty in walking, not elsewhere classified: Secondary | ICD-10-CM | POA: Insufficient documentation

## 2024-06-11 DIAGNOSIS — R2681 Unsteadiness on feet: Secondary | ICD-10-CM | POA: Insufficient documentation

## 2024-06-11 LAB — BASIC METABOLIC PANEL WITH GFR
BUN/Creatinine Ratio: 11 (ref 10–24)
BUN: 11 mg/dL (ref 8–27)
CO2: 20 mmol/L (ref 20–29)
Calcium: 9.3 mg/dL (ref 8.6–10.2)
Chloride: 104 mmol/L (ref 96–106)
Creatinine, Ser: 0.96 mg/dL (ref 0.76–1.27)
Glucose: 122 mg/dL — ABNORMAL HIGH (ref 70–99)
Potassium: 4.2 mmol/L (ref 3.5–5.2)
Sodium: 139 mmol/L (ref 134–144)
eGFR: 81 mL/min/1.73 (ref >59)

## 2024-06-11 LAB — MAGNESIUM: Magnesium: 2.3 mg/dL (ref 1.6–2.3)

## 2024-06-11 NOTE — Therapy (Signed)
 OUTPATIENT PHYSICAL THERAPY BALANCE TREATMENT  Patient Name: Roger Sanchez MRN: [id number] DOB:1946-04-28, 78 y.o., male Today's Date: 06/11/2024   PT End of Session - 06/11/24 0727     Visit Number 8    Number of Visits 11    Date for PT Re-Evaluation 07/04/24    PT Start Time --    Equipment Utilized During Treatment Gait belt    Activity Tolerance Patient tolerated treatment well    Behavior During Therapy WFL for tasks assessed/performed         Past Medical History:  Diagnosis Date   Arthralgia    shoulder    Asthma    Asymmetrical hearing loss    Atypical chest pain    Brachial plexus disorders    Bradycardia    Carpal tunnel syndrome    Cerebral infarction (HCC)    Closed fracture of wrist    Cognitive communication deficit    Dysarthria and anarthria    Exposure to Agent Orange    GERD (gastroesophageal reflux disease)    Grade I diastolic dysfunction    HOH (hard of hearing)    bilateral   Hyperlipidemia    Hypertrophy of prostate    benign    [name] (HCC)    [name] (HCC)    Low back pain    Lumbar spine pain    compression fracture    Malignant neoplasm (HCC)    skin    Metatarsalgia    Osteopenia    Plantar fibromatosis    Secondary parkinsonism (HCC)    Stroke (HCC) 78 yrs old   no residual effects   SVT (supraventricular tachycardia) (HCC)    Syncope    Transient ischemic attack    Trigger finger    Past Surgical History:  Procedure Laterality Date   BACK SURGERY     BROW LIFT Bilateral 03/26/2021   Procedure: BLEPHAROPLASTY UPPER EYELID; W/EXCESS SKIN BROW PTOSIS REPAIR  BLEPHAROPTOSIS REPAIR; RESECT EX BILATERAL;  Surgeon: Ashley Greig HERO, MD;  Location: [facility];  Service: Ophthalmology;  Laterality: Bilateral;   CATARACT EXTRACTION W/PHACO Left 04/11/2024   Procedure: PHACOEMULSIFICATION, CATARACT, WITH IOL INSERTION  6.21  00:46.9;  Surgeon: Enola Feliciano Hugger, MD;  Location: [facility];   Service: Ophthalmology;  Laterality: Left;   CATARACT EXTRACTION W/PHACO Right 04/25/2024   Procedure: PHACOEMULSIFICATION, CATARACT, WITH IOL INSERTION 7.21 00:46.2;  Surgeon: Enola Feliciano Hugger, MD;  Location: [facility];  Service: Ophthalmology;  Laterality: Right;   LOOP RECORDER REMOVAL N/A 06/06/2023   Procedure: LOOP RECORDER REMOVAL;  Surgeon: Fernande Elspeth BROCKS, MD;  Location: [facility];  Service: Cardiovascular;  Laterality: N/A;   PACEMAKER IMPLANT N/A 06/06/2023   Procedure: PACEMAKER IMPLANT;  Surgeon: Fernande Elspeth BROCKS, MD;  Location: [facility] INVASIVE CV LAB;  Service: Cardiovascular;  Laterality: N/A;   WRIST SURGERY Right    Patient Active Problem List   Diagnosis Date Noted   Chronotropic incompetence with sinus node dysfunction 06/09/2024   Cardiac pacemaker in situ 06/09/2024   Encounter for monitoring flecainide  therapy 06/09/2024   Cryptogenic stroke (HCC) 06/09/2024   Lightheadedness 05/08/2023   NSVT (nonsustained ventricular tachycardia) (HCC) 02/13/2023   SVT (supraventricular tachycardia) - non sustained 02/13/2023   Near syncope 12/13/2022   [name] (HCC) 12/13/2022   Sinus bradycardia 12/13/2022   History of CVA (cerebrovascular accident) 12/13/2022   Hyperlipidemia 12/13/2022   Dyspnea on exertion 12/13/2022    PCP: Mitchell Bolt, MD  Referring Provider: Mitchell Bolt, MD  REFERRING PROVIDER:   REFERRING DIAGNOSIS:  Z74.09 (ICD-10-CM) - Declining mobility   THERAPY DIAG: Unsteadiness on feet  Other abnormalities of gait and mobility  Muscle weakness (generalized)  Difficulty in walking, not elsewhere classified  ONSET DATE: chronic  FOLLOW UP APPT WITH PROVIDER: PRN   RATIONALE FOR EVALUATION AND TREATMENT: Rehabilitation   SUBJECTIVE:                                                                                                                                                                                          Chief Complaint: [name] is a 78 year old male with Hx of [name] and Parkinsonism.  Pt. Reports difficulty with stairs and recent falls reported.  Pt. States he is not walking as much and was walking 5 miles/day.    Pertinent History Pt is a 78 year old male with Hx of Lewy Body Demantia and Parkinsonism. Past medical history is significant for [name], TIA, CVA in 2013, asymmetrical hearing loss, dypsnea on exertion, anxiety, HLD, asthma. Pt wants to walk up to 5 miles per day; he walks 2-3 miles presently (pt walks around neighborhood streets mostly). Patient reports he did use walking stick, but using it has irritated his back (known arthritis per patient). Patient has diplopia and reports blurry vision at a distance. Pt likes to walk in woods and recently limited with descending stairs.    Pain: Yes; comorbid back pain/OA Numbness/Tingling: No Focal Weakness: No. Recent changes in overall health/medication: Yes; steadily worsening [name]  Prior history of physical therapy for balance:  Yes; see PT notes  Falls: Has patient fallen in last 6 months? Yes, Number of falls: 2 - fell on stairs in garage about 1 month ago/ fell the other day while putting pants on got tangled up Directional pattern for falls: Yes; falling forward when going down stairs and when he tripped one year ago   Imaging: Yes ;  CLINICAL DATA:  Transient ischemic attack (TIA). Neuro deficit, acute, stroke suspected.   Prior level of function: Independent Occupational demands: Retired Presenter, broadcasting: Walking routine; pt used to enjoy fishing   Red flags (bowel/bladder changes, saddle paresthesia, personal history of cancer, h/o spinal tumors, h/o compression fx, h/o abdominal aneurysm, abdominal pain, chills/fever, night sweats, nausea, vomiting, unrelenting pain): Negative  Precautions: Fall history, fall risk; pt has pacemaker  Weight Bearing Restrictions: No  Living  Environment Lives with: lives with their spouse Lives in: House/apartment; 4-5 steps with rail on R side going up.  lives in house 4-5 STE with rail remodeled bathroom with seat available (does not use)  1 level with bonus room - 12 steps with rail (where his bedroom is located) grandchildren local- enjoys playing horse basketball with them   Patient Goals: Maintain his level of function/independence    OBJECTIVE:   Patient Surveys  Berg: 54/56 ABC: 57% LEFS: 34 out of 80  Cognition Patient is oriented to person, place, and time.  Recent memory is intact.  Remote memory is moderately impaired.  Attention span and concentration are intact.  Expressive speech is intact, hypophonic.  Benefits from tx. In a quiet space Patient's fund of knowledge is within normal limits for educational level.    Gross Musculoskeletal Assessment/Observation Tremor: No resting tremor/intention tremor Bulk: Normal Tone: Hypotonic  Mask face, hypophonia.   GAIT: Distance walked: 470 ft  Assistive device utilized: None Level of assistance: SBA Comments: Good heel strike and velocity/cadence. Absent arm swing and rigid trunk. Ambulate outside on varying terrain/ parking lot.  Ascending/descending stairs  Posture: Rounded shoulder, forward head, trunk remains slightly flexed in sitting/standing    LE MMT:  MMT (out of 5) Right 03/26/2024 Left 03/26/2024  Hip flexion 5 5  Hip extension    Hip abduction (seated) 5 5  Hip adduction (seated) 5 5  Hip internal rotation    Hip external rotation    Knee flexion 5 5  Knee extension 5 5  Ankle dorsiflexion 5 5  Ankle plantarflexion    Ankle inversion    Ankle eversion    (* = pain; Blank rows = not tested)  Sensation Grossly intact to light touch bilateral LEs as determined by testing dermatomes L2-S2. Proprioception, and hot/cold testing deferred on this date.  Reflexes Deferred  Cranial Nerves Visual acuity and visual fields are  intact  Extraocular muscles are intact  -pt exhibits convergence insufficiency  Facial sensation is intact bilaterally  Facial strength is intact bilaterally  Hearing loss is mild as tested by gross conversation Palate elevates midline, normal phonation  Shoulder shrug strength is intact  Tongue protrudes slightly L    Coordination/Cerebellar Finger to Nose: Good Heel to Shin: Good Finger Opposition: Good   FUNCTIONAL OUTCOME MEASURES   Results Comments  BERG  Low fall risk          TUG NT   5TSTS 8.2 seconds Slight backward LOB during 5th rep, pt   (Blank rows = not tested)   FUNCTIONAL TASKS  Stairs: pt demonstrates reciprocal stair ascent with no handrail support, no loss of balance and sound toe clearance with each successive step Sit to stand: pt performs readily with no upper limb support, intermittently pushes posterior thighs on chair to assist and has one incident of mild posterior LOB upon attaining standing position  DGI: 21/24  TODAY'S TREATMENT  06/11/2024  Patient not arrived in error.  No show appointment   PATIENT EDUCATION:  Education details: see above for patient education details Person educated: Patient Education method: Explanation Education comprehension: verbalized understanding   HOME EXERCISE PROGRAM: Access Code: [email or web address] URL: [email or web address] Date: 05/29/2024 Prepared by: Ozell Sero Exercises - Standing Hip Abduction with Resistance at Ankles and Counter Support - 1 x daily - 4 x weekly - 2 sets - 10 reps - Standing Anti-Rotation Press with Anchored Resistance - 1 x daily - 4 x weekly - 2 sets - 10 reps - Scapular Retraction with Resistance - 1 x daily - 4 x weekly - 2 sets - 10 reps    ASSESSMENT:  CLINICAL IMPRESSION: No show appointment  REHAB POTENTIAL: Good  CLINICAL DECISION MAKING: Unstable/unpredictable  EVALUATION COMPLEXITY: High   GOALS: Goals reviewed with patient? Yes  LONG TERM GOALS:  Target date: 07/04/2024  Pt will increase LEFS to >50 out of 80 to improve functional mobility/ decrease fall risk.        Baseline: 34 out of 80.  6/25: 46 out of 80 Goal status: Partially met  2.  Pt will increase ABC to >70% to improve confidence/ independence with walking and descending stairs.        Baseline: 57%.  6/24: 60.6% Goal status: Partially met  3.  Pt. Able to descend stairs with reciprocal pattern and consistent clearance of heel to decrease fall risk.        Baseline: recent fall descending stairs.  Extra time to complete/ consistently clear heel. 7/3: goal met, pt still has concerns about his ability to safely descend stairs        Goal status: Partially met  4.  Pt. Able to walk in woods around house with no reports of loss of balance/ falls to improve independence and safety.         Baseline: fear of falling while walking in woods/ uneven terrain       Goal status: Initial    PLAN: PT FREQUENCY: biweekly   PT DURATION: 8 weeks  PLANNED INTERVENTIONS: Therapeutic exercises, Therapeutic activity, Neuromuscular re-education, Balance training, Gait training, Patient/Family education, Joint manipulation, Joint mobilization, Canalith repositioning, Aquatic Therapy, Dry Needling, Cognitive remediation, Electrical stimulation, Spinal manipulation, Spinal mobilization, Cryotherapy, Moist heat, Traction, Ultrasound, Ionotophoresis 4mg /ml Dexamethasone, and Manual therapy  PLAN FOR NEXT SESSION: Continue stair training   Ozell JAYSON Sero, PT, DPT # [phone] 06/11/2024, 7:50 AM

## 2024-06-25 ENCOUNTER — Ambulatory Visit: Attending: Family Medicine | Admitting: Physical Therapy

## 2024-06-25 ENCOUNTER — Encounter: Payer: Self-pay | Admitting: Physical Therapy

## 2024-06-25 ENCOUNTER — Ambulatory Visit: Admitting: Cardiology

## 2024-06-25 DIAGNOSIS — R262 Difficulty in walking, not elsewhere classified: Secondary | ICD-10-CM | POA: Diagnosis present

## 2024-06-25 DIAGNOSIS — R2681 Unsteadiness on feet: Secondary | ICD-10-CM | POA: Insufficient documentation

## 2024-06-25 DIAGNOSIS — R2689 Other abnormalities of gait and mobility: Secondary | ICD-10-CM | POA: Insufficient documentation

## 2024-06-25 DIAGNOSIS — M6281 Muscle weakness (generalized): Secondary | ICD-10-CM | POA: Insufficient documentation

## 2024-06-25 NOTE — Therapy (Addendum)
 OUTPATIENT PHYSICAL THERAPY BALANCE TREATMENT  Patient Name: Roger ZARRELLA MRN: [id number] DOB:December 28, 1945, 78 y.o., male Today's Date: 06/25/2024   PT End of Session - 06/25/24 0726     Visit Number 9    Number of Visits 11    Date for PT Re-Evaluation 07/04/24    PT Start Time 0718    PT Stop Time 0819    PT Time Calculation (min) 61 min    Equipment Utilized During Treatment Gait belt    Activity Tolerance Patient tolerated treatment well    Behavior During Therapy WFL for tasks assessed/performed         Past Medical History:  Diagnosis Date   Arthralgia    shoulder    Asthma    Asymmetrical hearing loss    Atypical chest pain    Brachial plexus disorders    Bradycardia    Carpal tunnel syndrome    Cerebral infarction (HCC)    Closed fracture of wrist    Cognitive communication deficit    Dysarthria and anarthria    Exposure to Agent Orange    GERD (gastroesophageal reflux disease)    Grade I diastolic dysfunction    HOH (hard of hearing)    bilateral   Hyperlipidemia    Hypertrophy of prostate    benign    [name] (HCC)    [name] (HCC)    Low back pain    Lumbar spine pain    compression fracture    Malignant neoplasm (HCC)    skin    Metatarsalgia    Osteopenia    Plantar fibromatosis    Secondary parkinsonism (HCC)    Stroke (HCC) 78 yrs old   no residual effects   SVT (supraventricular tachycardia) (HCC)    Syncope    Transient ischemic attack    Trigger finger    Past Surgical History:  Procedure Laterality Date   BACK SURGERY     BROW LIFT Bilateral 03/26/2021   Procedure: BLEPHAROPLASTY UPPER EYELID; W/EXCESS SKIN BROW PTOSIS REPAIR  BLEPHAROPTOSIS REPAIR; RESECT EX BILATERAL;  Surgeon: Ashley Greig HERO, MD;  Location: [facility];  Service: Ophthalmology;  Laterality: Bilateral;   CATARACT EXTRACTION W/PHACO Left 04/11/2024   Procedure: PHACOEMULSIFICATION, CATARACT, WITH IOL INSERTION  6.21  00:46.9;  Surgeon:  Enola Feliciano Hugger, MD;  Location: [facility];  Service: Ophthalmology;  Laterality: Left;   CATARACT EXTRACTION W/PHACO Right 04/25/2024   Procedure: PHACOEMULSIFICATION, CATARACT, WITH IOL INSERTION 7.21 00:46.2;  Surgeon: Enola Feliciano Hugger, MD;  Location: [facility];  Service: Ophthalmology;  Laterality: Right;   LOOP RECORDER REMOVAL N/A 06/06/2023   Procedure: LOOP RECORDER REMOVAL;  Surgeon: Fernande Elspeth BROCKS, MD;  Location: [facility] INVASIVE CV LAB;  Service: Cardiovascular;  Laterality: N/A;   PACEMAKER IMPLANT N/A 06/06/2023   Procedure: PACEMAKER IMPLANT;  Surgeon: Fernande Elspeth BROCKS, MD;  Location: [facility] INVASIVE CV LAB;  Service: Cardiovascular;  Laterality: N/A;   WRIST SURGERY Right    Patient Active Problem List   Diagnosis Date Noted   Chronotropic incompetence with sinus node dysfunction 06/09/2024   Cardiac pacemaker in situ 06/09/2024   Encounter for monitoring flecainide  therapy 06/09/2024   Cryptogenic stroke (HCC) 06/09/2024   Lightheadedness 05/08/2023   NSVT (nonsustained ventricular tachycardia) (HCC) 02/13/2023   SVT (supraventricular tachycardia) - non sustained 02/13/2023   Near syncope 12/13/2022   [name] (HCC) 12/13/2022   Sinus bradycardia 12/13/2022   History of CVA (cerebrovascular accident) 12/13/2022  Hyperlipidemia 12/13/2022   Dyspnea on exertion 12/13/2022    PCP: Mitchell Bolt, MD  Referring Provider: Mitchell Bolt, MD  REFERRING PROVIDER:   REFERRING DIAGNOSIS:  Z74.09 (ICD-10-CM) - Declining mobility   THERAPY DIAG: Unsteadiness on feet  Other abnormalities of gait and mobility  Muscle weakness (generalized)  Difficulty in walking, not elsewhere classified  ONSET DATE: chronic  FOLLOW UP APPT WITH PROVIDER: PRN   RATIONALE FOR EVALUATION AND TREATMENT: Rehabilitation   SUBJECTIVE:                                                                                                                                                                                          Chief Complaint: [name] is a 78 year old male with Hx of [name] and Parkinsonism.  Pt. Reports difficulty with stairs and recent falls reported.  Pt. States he is not walking as much and was walking 5 miles/day.    Pertinent History Pt is a 78 year old male with Hx of Lewy Body Demantia and Parkinsonism. Past medical history is significant for [name], TIA, CVA in 2013, asymmetrical hearing loss, dypsnea on exertion, anxiety, HLD, asthma. Pt wants to walk up to 5 miles per day; he walks 2-3 miles presently (pt walks around neighborhood streets mostly). Patient reports he did use walking stick, but using it has irritated his back (known arthritis per patient). Patient has diplopia and reports blurry vision at a distance. Pt likes to walk in woods and recently limited with descending stairs.    Pain: Yes; comorbid back pain/OA Numbness/Tingling: No Focal Weakness: No. Recent changes in overall health/medication: Yes; steadily worsening [name]  Prior history of physical therapy for balance:  Yes; see PT notes  Falls: Has patient fallen in last 6 months? Yes, Number of falls: 2 - fell on stairs in garage about 1 month ago/ fell the other day while putting pants on got tangled up Directional pattern for falls: Yes; falling forward when going down stairs and when he tripped one year ago   Imaging: Yes ;  CLINICAL DATA:  Transient ischemic attack (TIA). Neuro deficit, acute, stroke suspected.   Prior level of function: Independent Occupational demands: Retired Presenter, broadcasting: Walking routine; pt used to enjoy fishing   Red flags (bowel/bladder changes, saddle paresthesia, personal history of cancer, h/o spinal tumors, h/o compression fx, h/o abdominal aneurysm, abdominal pain, chills/fever, night sweats, nausea, vomiting, unrelenting pain): Negative  Precautions: Fall history, fall risk; pt has  pacemaker  Weight Bearing Restrictions: No  Living Environment Lives with: lives with their spouse Lives in: House/apartment; 4-5 steps with rail on R side going up.  lives in house 4-5 STE with rail remodeled bathroom with seat available (does not use) 1 level with bonus room - 12 steps with rail (where his bedroom is located) grandchildren local- enjoys playing horse basketball with them   Patient Goals: Maintain his level of function/independence    OBJECTIVE:   Patient Surveys  Berg: 54/56 ABC: 57% LEFS: 34 out of 80  Cognition Patient is oriented to person, place, and time.  Recent memory is intact.  Remote memory is moderately impaired.  Attention span and concentration are intact.  Expressive speech is intact, hypophonic.  Benefits from tx. In a quiet space Patient's fund of knowledge is within normal limits for educational level.    Gross Musculoskeletal Assessment/Observation Tremor: No resting tremor/intention tremor Bulk: Normal Tone: Hypotonic  Mask face, hypophonia.   GAIT: Distance walked: 470 ft  Assistive device utilized: None Level of assistance: SBA Comments: Good heel strike and velocity/cadence. Absent arm swing and rigid trunk. Ambulate outside on varying terrain/ parking lot.  Ascending/descending stairs  Posture: Rounded shoulder, forward head, trunk remains slightly flexed in sitting/standing    LE MMT:  MMT (out of 5) Right 03/26/2024 Left 03/26/2024  Hip flexion 5 5  Hip extension    Hip abduction (seated) 5 5  Hip adduction (seated) 5 5  Hip internal rotation    Hip external rotation    Knee flexion 5 5  Knee extension 5 5  Ankle dorsiflexion 5 5  Ankle plantarflexion    Ankle inversion    Ankle eversion    (* = pain; Blank rows = not tested)  Sensation Grossly intact to light touch bilateral LEs as determined by testing dermatomes L2-S2. Proprioception, and hot/cold testing deferred on this  date.  Reflexes Deferred  Cranial Nerves Visual acuity and visual fields are intact  Extraocular muscles are intact  -pt exhibits convergence insufficiency  Facial sensation is intact bilaterally  Facial strength is intact bilaterally  Hearing loss is mild as tested by gross conversation Palate elevates midline, normal phonation  Shoulder shrug strength is intact  Tongue protrudes slightly L    Coordination/Cerebellar Finger to Nose: Good Heel to Shin: Good Finger Opposition: Good   FUNCTIONAL OUTCOME MEASURES   Results Comments  BERG  Low fall risk          TUG NT   5TSTS 8.2 seconds Slight backward LOB during 5th rep, pt   (Blank rows = not tested)   FUNCTIONAL TASKS  Stairs: pt demonstrates reciprocal stair ascent with no handrail support, no loss of balance and sound toe clearance with each successive step Sit to stand: pt performs readily with no upper limb support, intermittently pushes posterior thighs on chair to assist and has one incident of mild posterior LOB upon attaining standing position  DGI: 21/24  TODAY'S TREATMENT  06/25/2024  Subjective:  Pt. Reports several falls since last PT tx. Session.  Pt. Reports falling in yard, down a couple stairs and out of bed.  Pt. Cut region over L eye during fall from bed and received stitches.  Pt. States he did some walking this morning and has continued with walking in neighborhood.     There.ex.:   Nustep L4 B UE/LE for 10 min. (Warm-up)- discussed recent falls at home.    5# ankle wts.: seated marching/ LAQ 20x each.    Sit to stands from gray chair without UE assist.  Reviewed walking/ HEP.    There.act.:   Walking outside with gait belt and  focus on navigating uneven terrain, SBA/CGA 8 min.  Ascending/descending stairs outside/ walking over grass/ tree roots/ up and down curb.     Walking in hallway (5# ankle wt.)- high marching/ alt. UE/LE touches/ stepping over 6 and 12 hurdles.     12 step  touches (heel) at stairs with light to no UE assist.  Occasionally catching toe/ shoe on front of step.    Resisted gait 2BTB in //-bars 5x all 4-planes.  No UE assist.    Standing NBOS/ tandem stance/ tandem gait/ SLS.  Included EO and EC.  CGA for safety.  Pt. Did well with EC tasks with no UE assist in //-bars.      PATIENT EDUCATION:  Education details: see above for patient education details Person educated: Patient Education method: Explanation Education comprehension: verbalized understanding   HOME EXERCISE PROGRAM: Access Code: [email or web address] URL: [email or web address] Date: 05/29/2024 Prepared by: Ozell Sero Exercises - Standing Hip Abduction with Resistance at Ankles and Counter Support - 1 x daily - 4 x weekly - 2 sets - 10 reps - Standing Anti-Rotation Press with Anchored Resistance - 1 x daily - 4 x weekly - 2 sets - 10 reps - Scapular Retraction with Resistance - 1 x daily - 4 x weekly - 2 sets - 10 reps    ASSESSMENT:  CLINICAL IMPRESSION: Pt highly motivated and works hard during tx. Session. Today's tx session focused on navigating uneven terrain and stairs. Pt experienced no LOB during tx. But did catch shoe on front of step multiple times while completing step touches.  Pt. Instructed on clearance of heel during step downs on outside stairs.  PT/pt. Discussed pts. Vision and use of prizm glasses due to double vision.  Pt would benefit from continued skilled PT to reduce his fall risk and improve confidence with descending stairs.   REHAB POTENTIAL: Good  CLINICAL DECISION MAKING: Unstable/unpredictable  EVALUATION COMPLEXITY: High   GOALS: Goals reviewed with patient? Yes  LONG TERM GOALS: Target date: 07/04/2024  Pt will increase LEFS to >50 out of 80 to improve functional mobility/ decrease fall risk.        Baseline: 34 out of 80.  6/25: 46 out of 80.  8/19: 57 out of 80 Goal status: Goal met  2.  Pt will increase ABC to >70% to improve  confidence/ independence with walking and descending stairs.        Baseline: 57%.  6/24: 60.6%.  8/19:  57% (no significant change)   Goal status: Not met  3.  Pt. Able to descend stairs with reciprocal pattern and consistent clearance of heel to decrease fall risk.        Baseline: recent fall descending stairs.  Extra time to complete/ consistently clear heel. 7/3: goal met, pt still has concerns about his ability to safely descend stairs        Goal status: Partially met  4.  Pt. Able to walk in woods around house with no reports of loss of balance/ falls to improve independence and safety.         Baseline: fear of falling while walking in woods/ uneven terrain       Goal status: Initial    PLAN: PT FREQUENCY: biweekly   PT DURATION: 8 weeks  PLANNED INTERVENTIONS: Therapeutic exercises, Therapeutic activity, Neuromuscular re-education, Balance training, Gait training, Patient/Family education, Joint manipulation, Joint mobilization, Canalith repositioning, Aquatic Therapy, Dry Needling, Cognitive remediation, Electrical stimulation, Spinal manipulation, Spinal mobilization, Cryotherapy, Moist heat, Traction, Ultrasound,  Ionotophoresis 4mg /ml Dexamethasone, and Manual therapy  PLAN FOR NEXT SESSION: 10th visit progress note/ RECERT (discuss POC).     Ozell JAYSON Sero, PT, DPT # [id number] 06/25/2024, 8:52 AM

## 2024-07-10 ENCOUNTER — Ambulatory Visit: Attending: Family Medicine | Admitting: Physical Therapy

## 2024-07-10 DIAGNOSIS — R2681 Unsteadiness on feet: Secondary | ICD-10-CM | POA: Insufficient documentation

## 2024-07-10 DIAGNOSIS — M6281 Muscle weakness (generalized): Secondary | ICD-10-CM | POA: Insufficient documentation

## 2024-07-10 DIAGNOSIS — R262 Difficulty in walking, not elsewhere classified: Secondary | ICD-10-CM | POA: Diagnosis present

## 2024-07-10 DIAGNOSIS — R2689 Other abnormalities of gait and mobility: Secondary | ICD-10-CM | POA: Diagnosis present

## 2024-07-10 NOTE — Therapy (Unsigned)
 OUTPATIENT PHYSICAL THERAPY BALANCE TREATMENT/DISCHARGE  Patient Name: Roger Sanchez MRN: [id number] DOB:[birth year], 78 y.o., male Today's Date: 07/10/2024   PT End of Session - 07/10/24 0721     Visit Number 10    Number of Visits 11    Date for PT Re-Evaluation 07/04/24    PT Start Time 0721    PT Stop Time 0814    PT Time Calculation (min) 53 min    Equipment Utilized During Treatment Gait belt    Activity Tolerance Patient tolerated treatment well;No increased pain    Behavior During Therapy WFL for tasks assessed/performed         Past Medical History:  Diagnosis Date   Arthralgia    shoulder    Asthma    Asymmetrical hearing loss    Atypical chest pain    Brachial plexus disorders    Bradycardia    Carpal tunnel syndrome    Cerebral infarction (HCC)    Closed fracture of wrist    Cognitive communication deficit    Dysarthria and anarthria    Exposure to Agent Orange    GERD (gastroesophageal reflux disease)    Grade I diastolic dysfunction    HOH (hard of hearing)    bilateral   Hyperlipidemia    Hypertrophy of prostate    benign    [name] (HCC)    [name] (HCC)    Low back pain    Lumbar spine pain    compression fracture    Malignant neoplasm (HCC)    skin    Metatarsalgia    Osteopenia    Plantar fibromatosis    Secondary parkinsonism (HCC)    Stroke (HCC) 78 yrs old   no residual effects   SVT (supraventricular tachycardia) (HCC)    Syncope    Transient ischemic attack    Trigger finger    Past Surgical History:  Procedure Laterality Date   BACK SURGERY     BROW LIFT Bilateral 03/26/2021   Procedure: BLEPHAROPLASTY UPPER EYELID; W/EXCESS SKIN BROW PTOSIS REPAIR  BLEPHAROPTOSIS REPAIR; RESECT EX BILATERAL;  Surgeon: Ashley Greig HERO, MD;  Location: [facility];  Service: Ophthalmology;  Laterality: Bilateral;   CATARACT EXTRACTION W/PHACO Left 04/11/2024   Procedure: PHACOEMULSIFICATION, CATARACT, WITH IOL  INSERTION  6.21  00:46.9;  Surgeon: Enola Feliciano Hugger, MD;  Location: [facility];  Service: Ophthalmology;  Laterality: Left;   CATARACT EXTRACTION W/PHACO Right 04/25/2024   Procedure: PHACOEMULSIFICATION, CATARACT, WITH IOL INSERTION 7.21 00:46.2;  Surgeon: Enola Feliciano Hugger, MD;  Location: [facility];  Service: Ophthalmology;  Laterality: Right;   LOOP RECORDER REMOVAL N/A 06/06/2023   Procedure: LOOP RECORDER REMOVAL;  Surgeon: Fernande Elspeth BROCKS, MD;  Location: [facility] INVASIVE CV LAB;  Service: Cardiovascular;  Laterality: N/A;   PACEMAKER IMPLANT N/A 06/06/2023   Procedure: PACEMAKER IMPLANT;  Surgeon: Fernande Elspeth BROCKS, MD;  Location: [facility] INVASIVE CV LAB;  Service: Cardiovascular;  Laterality: N/A;   WRIST SURGERY Right    Patient Active Problem List   Diagnosis Date Noted   Chronotropic incompetence with sinus node dysfunction 06/09/2024   Cardiac pacemaker in situ 06/09/2024   Encounter for monitoring flecainide  therapy 06/09/2024   Cryptogenic stroke (HCC) 06/09/2024   Lightheadedness 05/08/2023   NSVT (nonsustained ventricular tachycardia) (HCC) 02/13/2023   SVT (supraventricular tachycardia) - non sustained 02/13/2023   Near syncope 12/13/2022   [name] (HCC) 12/13/2022   Sinus bradycardia 12/13/2022   History of CVA (cerebrovascular accident)  12/13/2022   Hyperlipidemia 12/13/2022   Dyspnea on exertion 12/13/2022    PCP: Mitchell Bolt, MD  Referring Provider: Mitchell Bolt, MD  REFERRING PROVIDER:   REFERRING DIAGNOSIS:  Z74.09 (ICD-10-CM) - Declining mobility   THERAPY DIAG: Unsteadiness on feet  Other abnormalities of gait and mobility  Muscle weakness (generalized)  Difficulty in walking, not elsewhere classified  ONSET DATE: chronic  FOLLOW UP APPT WITH PROVIDER: PRN   RATIONALE FOR EVALUATION AND TREATMENT: Rehabilitation   SUBJECTIVE:                                                                                                                                                                                          Chief Complaint: [name] is a 78 year old male with Hx of [name] and Parkinsonism.  Pt. Reports difficulty with stairs and recent falls reported.  Pt. States he is not walking as much and was walking 5 miles/day.    Pertinent History Pt is a 78 year old male with Hx of Lewy Body Demantia and Parkinsonism. Past medical history is significant for [name], TIA, CVA in 2013, asymmetrical hearing loss, dypsnea on exertion, anxiety, HLD, asthma. Pt wants to walk up to 5 miles per day; he walks 2-3 miles presently (pt walks around neighborhood streets mostly). Patient reports he did use walking stick, but using it has irritated his back (known arthritis per patient). Patient has diplopia and reports blurry vision at a distance. Pt likes to walk in woods and recently limited with descending stairs.    Pain: Yes; comorbid back pain/OA Numbness/Tingling: No Focal Weakness: No. Recent changes in overall health/medication: Yes; steadily worsening [name]  Prior history of physical therapy for balance:  Yes; see PT notes  Falls: Has patient fallen in last 6 months? Yes, Number of falls: 2 - fell on stairs in garage about 1 month ago/ fell the other day while putting pants on got tangled up Directional pattern for falls: Yes; falling forward when going down stairs and when he tripped one year ago   Imaging: Yes ;  CLINICAL DATA:  Transient ischemic attack (TIA). Neuro deficit, acute, stroke suspected.   Prior level of function: Independent Occupational demands: Retired Presenter, broadcasting: Walking routine; pt used to enjoy fishing   Red flags (bowel/bladder changes, saddle paresthesia, personal history of cancer, h/o spinal tumors, h/o compression fx, h/o abdominal aneurysm, abdominal pain, chills/fever, night sweats, nausea, vomiting, unrelenting pain): Negative  Precautions:  Fall history, fall risk; pt has pacemaker  Weight Bearing Restrictions: No  Living Environment Lives with: lives with their spouse Lives in: House/apartment; 4-5 steps with rail on R  side going up.  lives in house 4-5 STE with rail remodeled bathroom with seat available (does not use) 1 level with bonus room - 12 steps with rail (where his bedroom is located) grandchildren local- enjoys playing horse basketball with them   Patient Goals: Maintain his level of function/independence    OBJECTIVE:   Patient Surveys  Berg: 54/56 ABC: 57% LEFS: 34 out of 80  Cognition Patient is oriented to person, place, and time.  Recent memory is intact.  Remote memory is moderately impaired.  Attention span and concentration are intact.  Expressive speech is intact, hypophonic.  Benefits from tx. In a quiet space Patient's fund of knowledge is within normal limits for educational level.    Gross Musculoskeletal Assessment/Observation Tremor: No resting tremor/intention tremor Bulk: Normal Tone: Hypotonic  Mask face, hypophonia.   GAIT: Distance walked: 470 ft  Assistive device utilized: None Level of assistance: SBA Comments: Good heel strike and velocity/cadence. Absent arm swing and rigid trunk. Ambulate outside on varying terrain/ parking lot.  Ascending/descending stairs  Posture: Rounded shoulder, forward head, trunk remains slightly flexed in sitting/standing    LE MMT:  MMT (out of 5) Right 03/26/2024 Left 03/26/2024  Hip flexion 5 5  Hip extension    Hip abduction (seated) 5 5  Hip adduction (seated) 5 5  Hip internal rotation    Hip external rotation    Knee flexion 5 5  Knee extension 5 5  Ankle dorsiflexion 5 5  Ankle plantarflexion    Ankle inversion    Ankle eversion    (* = pain; Blank rows = not tested)  Sensation Grossly intact to light touch bilateral LEs as determined by testing dermatomes L2-S2. Proprioception, and hot/cold testing deferred on this  date.  Reflexes Deferred  Cranial Nerves Visual acuity and visual fields are intact  Extraocular muscles are intact  -pt exhibits convergence insufficiency  Facial sensation is intact bilaterally  Facial strength is intact bilaterally  Hearing loss is mild as tested by gross conversation Palate elevates midline, normal phonation  Shoulder shrug strength is intact  Tongue protrudes slightly L    Coordination/Cerebellar Finger to Nose: Good Heel to Shin: Good Finger Opposition: Good   FUNCTIONAL OUTCOME MEASURES   Results Comments  BERG  Low fall risk          TUG NT   5TSTS 8.2 seconds Slight backward LOB during 5th rep, pt   (Blank rows = not tested)   FUNCTIONAL TASKS  Stairs: pt demonstrates reciprocal stair ascent with no handrail support, no loss of balance and sound toe clearance with each successive step Sit to stand: pt performs readily with no upper limb support, intermittently pushes posterior thighs on chair to assist and has one incident of mild posterior LOB upon attaining standing position  DGI: 21/24  TODAY'S TREATMENT  07/10/2024  Subjective:  Pt. Reports that he has not had any falls since last PT tx. Session. Stiches over left eye region from fall that occurred last week is healing well. Pt. states he did some walking this morning and has continued with daily walking in neighborhood.     There.ex.:   Nustep L4 B UE/LE for 10 min. (Warm-up)- discussed HEP/walking routine  5# ankle wts.: seated marching/ LAQ 20x bilaterally.    Sit to stands from gray chair without UE assist, 2x12  Reviewed walking/ HEP.    There.act.:   Walking outside with gait belt and focus on navigating uneven terrain, CGA 8 min.  Ascending/descending stairs outside/ walking over grass/ tree roots/ up and down curb.     Walking in hallway (5# ankle wt.)- high marching/ forward and lateral stepping over 6 and 12 hurdles.     12 step touches (heel) at stairs with light  to no UE assist.  Occasionally catching toe/ shoe on front of step.    Resisted gait 2BTB in //-bars 5x all 4-planes.  No UE assist.    Standing NBOS/ tandem stance/ tandem gait/ SLS. On firm and foam surfaces Included EO and EC.  CGA for safety.   Navigating steps going up and down with and without obstacles   Hallway Obstacle course with 6 inch hurdles, SLS on foam pad, and navigating obstacles   PATIENT EDUCATION:  Education details: see above for patient education details Person educated: Patient Education method: Explanation Education comprehension: verbalized understanding   HOME EXERCISE PROGRAM: Access Code: [email or web address] URL: [email or web address] Date: 05/29/2024 Prepared by: Ozell Sero Exercises - Standing Hip Abduction with Resistance at Ankles and Counter Support - 1 x daily - 4 x weekly - 2 sets - 10 reps - Standing Anti-Rotation Press with Anchored Resistance - 1 x daily - 4 x weekly - 2 sets - 10 reps - Scapular Retraction with Resistance - 1 x daily - 4 x weekly - 2 sets - 10 reps    ASSESSMENT:  CLINICAL IMPRESSION: Today's tx session focused on static and dynamic balance while navigating uneven terrain, surfaces, and stairs. Pt experienced no LOB during tx. but required CGA for all dynamic and static standing balance tasks. Pt. Ambulated up and down stairs with reciprocal gait pattern and able to adjust his foot placements when faced with environmental obstacles placed on steps. Pt. Was able to maintain SL balance on foam pad with eyes closed repeatedly for greater than 10 seconds bilaterally. Pt. Did demonstrated difficulty maintaining movement in the frontal plane only during lateral walking over 6 and 12 inch hurdles with Aws with tendency to turn hips and trunk inward towards the hurdles despite repeated verbal cueing, likely secondary to deficits in glute med. Endurance capacity.   Pt. has demonstrated improvements in standing and dynamic balance, gross  LE strength, and self perceived improvements in daily function leading to improved ability to ambulate on even and uneven surfaces while navigating environmental obstacles without fear of falling. Pt. Still has deficits with eccentric dorsiflexion control which makes descending stairs or curbs relatively challenging at this time. Pt. to be discharged from physical therapy services at this time after meeting most his long term goals.    REHAB POTENTIAL: Good  CLINICAL DECISION MAKING: Unstable/unpredictable  EVALUATION COMPLEXITY: High   GOALS: Goals reviewed with patient? Yes  LONG TERM GOALS: Target date: 07/04/2024  Pt will increase LEFS to >50 out of 80 to improve functional mobility/ decrease fall risk.        Baseline: 34 out of 80.  6/25: 46 out of 80.  8/19: 57 out of 80 Goal status: Goal met  2.  Pt will increase ABC to >70% to improve confidence/ independence with walking and descending stairs.        Baseline: 57%.  6/24: 60.6%.  8/19:  57% (no significant change)   Goal status: Not met  3.  Pt. Able to descend stairs with reciprocal pattern and consistent clearance of heel to decrease fall risk.        Baseline: recent fall descending stairs.  Extra time to complete/ consistently clear heel. 7/3: goal met,  pt still has concerns about his ability to safely descend stairs        Goal status: Goal Met  4.  Pt. Able to walk in woods around house with no reports of loss of balance/ falls to improve independence and safety.         Baseline: fear of falling while walking in woods/ uneven terrain       Goal status: Goal met (pt. Has not attempted walking in woods until the weather gets cooler but no longer has fear of falling or major safety concerns     PLAN: PT FREQUENCY: biweekly   PT DURATION: 8 weeks  PLANNED INTERVENTIONS: Therapeutic exercises, Therapeutic activity, Neuromuscular re-education, Balance training, Gait training, Patient/Family education, Joint  manipulation, Joint mobilization, Canalith repositioning, Aquatic Therapy, Dry Needling, Cognitive remediation, Electrical stimulation, Spinal manipulation, Spinal mobilization, Cryotherapy, Moist heat, Traction, Ultrasound, Ionotophoresis 4mg /ml Dexamethasone, and Manual therapy  PLAN FOR NEXT SESSION: Patient to be discharged from physical therapy services at this time.    Curtistine Bracket, SPT  Ozell JAYSON Sero, PT, DPT # [phone] 07/10/2024, 9:12 AM

## 2024-07-31 ENCOUNTER — Telehealth: Payer: Self-pay | Admitting: Cardiology

## 2024-07-31 NOTE — Telephone Encounter (Signed)
   Pre-operative Risk Assessment    Patient Name: Roger Sanchez  DOB: 1946-02-22 MRN: [id number]   Date of last office visit: 06/10/24 Date of next office visit: n/a   Request for Surgical Clearance    Procedure:  Cystolitholapaxy Lithotripry  Date of Surgery:  Clearance 08/23/24                                Surgeon:  not indicated Surgeon's Group or Practice Name:  [facility] Phone number:  [phone] ext [phone] Fax number:    Type of Clearance Requested:   - Medical    Type of Anesthesia:  Not Indicated   Additional requests/questions:    Signed, Arsenio Celine GAILS   07/31/2024, 1:10 PM

## 2024-07-31 NOTE — Telephone Encounter (Signed)
 Roger Sanchez  You saw this patient on 06/10/2024. Per protocol we request that you comment on his cardiac risk to proceed with Cystolitholapaxy Lithotripry  since it has been less than 2 months since evaluated in the office. Please send your comment to P CV Pre-Op Pool.  Thank you, Lamarr Satterfield DNP, ANP, AACC.

## 2024-08-05 NOTE — Telephone Encounter (Signed)
 Called the phone number provided and was given the following fax number: [phone]

## 2024-08-05 NOTE — Telephone Encounter (Signed)
   Primary Cardiologist: None  Chart reviewed as part of pre-operative protocol coverage. Given past medical history and time since last visit, based on ACC/AHA guidelines, Roger Sanchez would be at acceptable risk for the planned procedure without further cardiovascular testing.   Patient should contact our office if he is having new symptoms that are concerning from a cardiac perspective to arrange a follow-up appointment.    No request to hold cardiac medications.  I will route this recommendation to the requesting party via Epic fax function and remove from pre-op pool.  Please call with questions.  Rosaline EMERSON Bane, NP-C 08/05/2024, 4:23 PM [address], [address] [place], [place] [address] Office [phone] Fax [phone]

## 2024-08-07 NOTE — Progress Notes (Signed)
 Remote PPM Transmission

## 2024-08-15 NOTE — Telephone Encounter (Signed)
 Faxed over clearance to fax # provided below, : [phone] .

## 2024-08-15 NOTE — Telephone Encounter (Signed)
 Pt is calling saying the [facility] called and said they have not received clearance. She is asking that we re-fax it.

## 2024-08-15 NOTE — Telephone Encounter (Signed)
 Pt wife calling to provide the correct fax number: [phone]

## 2024-08-16 NOTE — Telephone Encounter (Signed)
 Patient came by office and said that the [facility] has not received any faxes. Please advise. Please call the patient's wife at [phone] [name]).

## 2024-08-16 NOTE — Telephone Encounter (Signed)
 S/w the [facility] to confirm the fax# so that we may fax clearance notes to the [facility] for the pt.   There are a couple of fax # in the notes. I have confirmed with the [facility] just now the correct fax# is [phone]   I will re-fax the clearance notes to the [facility] on behalf of the pt

## 2024-09-03 ENCOUNTER — Ambulatory Visit: Payer: No Typology Code available for payment source

## 2024-09-03 DIAGNOSIS — R001 Bradycardia, unspecified: Secondary | ICD-10-CM

## 2024-09-04 LAB — CUP PACEART REMOTE DEVICE CHECK
Battery Remaining Longevity: 150 mo
Battery Voltage: 3.03 V
Brady Statistic AP VP Percent: 0.14 %
Brady Statistic AP VS Percent: 85.47 %
Brady Statistic AS VP Percent: 0 %
Brady Statistic AS VS Percent: 14.39 %
Brady Statistic RA Percent Paced: 85.82 %
Brady Statistic RV Percent Paced: 0.14 %
Date Time Interrogation Session: 20251027204136
Implantable Lead Connection Status: 753985
Implantable Lead Connection Status: 753985
Implantable Lead Implant Date: 20240730
Implantable Lead Implant Date: 20240730
Implantable Lead Location: 753859
Implantable Lead Location: 753860
Implantable Lead Model: 5076
Implantable Lead Model: 5076
Implantable Pulse Generator Implant Date: 20240730
Lead Channel Impedance Value: 323 Ohm
Lead Channel Impedance Value: 361 Ohm
Lead Channel Impedance Value: 437 Ohm
Lead Channel Impedance Value: 456 Ohm
Lead Channel Pacing Threshold Amplitude: 0.5 V
Lead Channel Pacing Threshold Amplitude: 0.75 V
Lead Channel Pacing Threshold Pulse Width: 0.4 ms
Lead Channel Pacing Threshold Pulse Width: 0.4 ms
Lead Channel Sensing Intrinsic Amplitude: 1.5 mV
Lead Channel Sensing Intrinsic Amplitude: 1.5 mV
Lead Channel Sensing Intrinsic Amplitude: 12.5 mV
Lead Channel Sensing Intrinsic Amplitude: 12.5 mV
Lead Channel Setting Pacing Amplitude: 1.5 V
Lead Channel Setting Pacing Amplitude: 2 V
Lead Channel Setting Pacing Pulse Width: 0.4 ms
Lead Channel Setting Sensing Sensitivity: 1.2 mV
Zone Setting Status: 755011

## 2024-09-05 ENCOUNTER — Ambulatory Visit: Payer: Self-pay | Admitting: Cardiology

## 2024-09-10 NOTE — Progress Notes (Signed)
 Remote PPM Transmission

## 2024-10-10 ENCOUNTER — Other Ambulatory Visit: Payer: Self-pay | Admitting: Cardiology

## 2024-10-15 MED ORDER — FLECAINIDE ACETATE 50 MG PO TABS: 25.0000 mg | ORAL_TABLET | Freq: Two times a day (BID) | ORAL | 2 refills | Status: AC

## 2024-12-03 ENCOUNTER — Ambulatory Visit: Payer: No Typology Code available for payment source

## 2024-12-03 DIAGNOSIS — R001 Bradycardia, unspecified: Secondary | ICD-10-CM

## 2024-12-04 LAB — CUP PACEART REMOTE DEVICE CHECK
Battery Remaining Longevity: 147 mo
Battery Voltage: 3.02 V
Brady Statistic AP VP Percent: 0.25 %
Brady Statistic AP VS Percent: 87.56 %
Brady Statistic AS VP Percent: 0.01 %
Brady Statistic AS VS Percent: 12.19 %
Brady Statistic RA Percent Paced: 87.97 %
Brady Statistic RV Percent Paced: 0.25 %
Date Time Interrogation Session: 20260128100226
Implantable Lead Connection Status: 753985
Implantable Lead Connection Status: 753985
Implantable Lead Implant Date: 20240730
Implantable Lead Implant Date: 20240730
Implantable Lead Location: 753859
Implantable Lead Location: 753860
Implantable Lead Model: 5076
Implantable Lead Model: 5076
Implantable Pulse Generator Implant Date: 20240730
Lead Channel Impedance Value: 304 Ohm
Lead Channel Impedance Value: 361 Ohm
Lead Channel Impedance Value: 418 Ohm
Lead Channel Impedance Value: 456 Ohm
Lead Channel Pacing Threshold Amplitude: 0.5 V
Lead Channel Pacing Threshold Amplitude: 0.75 V
Lead Channel Pacing Threshold Pulse Width: 0.4 ms
Lead Channel Pacing Threshold Pulse Width: 0.4 ms
Lead Channel Sensing Intrinsic Amplitude: 11.625 mV
Lead Channel Sensing Intrinsic Amplitude: 11.625 mV
Lead Channel Sensing Intrinsic Amplitude: 2.375 mV
Lead Channel Sensing Intrinsic Amplitude: 2.375 mV
Lead Channel Setting Pacing Amplitude: 1.5 V
Lead Channel Setting Pacing Amplitude: 2 V
Lead Channel Setting Pacing Pulse Width: 0.4 ms
Lead Channel Setting Sensing Sensitivity: 1.2 mV
Zone Setting Status: 755011

## 2024-12-08 ENCOUNTER — Ambulatory Visit: Payer: Self-pay | Admitting: Cardiology

## 2024-12-10 NOTE — Progress Notes (Unsigned)
 "     Electrophysiology Clinic Note    Date:  12/11/2024  Patient ID:  Roger Sanchez, Roger Sanchez 02-27-1946, MRN [id number] PCP:  Cena Simpers, MD  Cardiologist:  None  Electrophysiologist:  Fonda Kitty, MD  Electrophysiology APP:  Karris Deangelo, NP     Discussed the use of AI scribe software for clinical note transcription with the patient, who gave verbal consent to proceed.   Patient Profile    Chief Complaint: SVT, NSVT, flecainide  follow-up  History of Present Illness: Roger Sanchez is a 79 y.o. male with PMH notable for SVT, NSVT, non-obs CAD, [name], bradycardia with chronotropic incompetence s/p PPM, PVCs, PACs, orthostatic hypotension, CVA ; seen today for Fonda Kitty, MD (Previously Dr. Fernande) for routine electrophysiology followup.   I last saw him 06/2024 where he was orthostatics were positive in office. Had a recent fall requiring stitches to L brow and eyelid.   Today, he is overall doing well. He denies palpitations, fluttering in chest, chest pain, or chest pressure. He has not had any further falls, no dizziness or presyncopal episodes. He says his wife tells him frequently that he does not drink enough fluid, and has been making a more concerted effort to drink more.    Wife joins for visit whom I have met before    Arrhythmia/Device History MDT dual chamber PPM, imp 05/2023; dx SND  MDT ILR - explanted    AAD -  Flecainide  (without AVN blocking agent)    ROS:  Please see the history of present illness. All other systems are reviewed and otherwise negative.    Physical Exam    VS:  BP 126/60 (BP Location: Left Arm, Patient Position: Sitting, Cuff Size: Normal)   Pulse 61   Ht 5' 6 (1.676 m)   Wt 163 lb 6.4 oz (74.1 kg)   SpO2 99%   BMI 26.37 kg/m  BMI: Body mass index is 26.37 kg/m.  Orthostatic VS for the past 24 hrs (Last 3 readings):  BP- Lying Pulse- Lying BP- Sitting Pulse- Sitting BP- Standing at 0 minutes Pulse-  Standing at 0 minutes BP- Standing at 3 minutes Pulse- Standing at 3 minutes  12/11/24 1430 132/79 65 125/77 67 108/70 65 107/72 64          Wt Readings from Last 3 Encounters:  12/11/24 163 lb 6.4 oz (74.1 kg)  06/10/24 161 lb 3.2 oz (73.1 kg)  04/25/24 163 lb 8 oz (74.2 kg)      GEN- The patient is well appearing, alert and oriented x 3 today.   Lungs- Clear to ausculation bilaterally, normal work of breathing.  Heart- Regular rate and rhythm, 3/6 murmur appreciated  Extremities- No peripheral edema, warm, dry Skin-   device pocket well-healed, no tethering  Brief check performed without iterative lead testing/measurements   Studies Reviewed   Previous EP, cardiology notes.    EKG is ordered. Personal review of EKG from today shows:    EKG Interpretation Date/Time:  Wednesday December 11 2024 14:24:11 EST Ventricular Rate:  61 PR Interval:  230 QRS Duration:  82 QT Interval:  418 QTC Calculation: 420 R Axis:   31  Text Interpretation: Atrial-paced rhythm with prolonged AV conduction Confirmed by Tramain Gershman [phone]) on 12/11/2024 2:43:39 PM    03/18/2024 EKG - AP-VS at 61; PR 214, QRS 88, QTC 416 09/07/2023 EKG - AP-VS at 60; PR 208, QRS 92, QTC 424  TTE, 04/02/2024  1. Left ventricular ejection fraction,  by estimation, is 60 to 65%. The left ventricle has normal function. The left ventricle has no regional wall motion abnormalities. Left ventricular diastolic parameters are consistent with Grade I diastolic dysfunction (impaired relaxation).   2. Right ventricular systolic function is normal. The right ventricular size is normal.   3. Left atrial size was mild to moderately dilated.   4. The mitral valve is normal in structure. No evidence of mitral valve regurgitation.   5. Mean Aov gradient , PG , AVA 1.5cm2. The aortic valve is tricuspid. Aortic valve regurgitation is not visualized. Mild to moderate aortic valve stenosis.    Coronary CTA, 02/15/2023 1.  Coronary calcium  score of 754. This was 73rd percentile for age and sex matched control.  2. Normal coronary origin with right dominance.  3. Mild proximal LAD and LCx stenosis (25-49%).  4. Minimal proximal LCx stenosis (<25%).  5. CAD-RADS 2. Mild non-obstructive CAD (25-49%). Consider preventive therapy and risk factor modification.     Assessment and Plan     #) SVT #) NSVT #) flecainide  monitoring No symptomatic episodes, no episodes on pacemaker EKG with stable intervals Continue 50 mg flecainide  twice daily Update BMP, Mag today  #) bradycardia, chronotropic incompetence #) PPM in situ Most recent remote interrogation with stable lead measurements, good battery   #) orthostatic hypotension No recent falls I encourage increased p.o. hydration and salt       Current medicines are reviewed at length with the patient today.   The patient does not have concerns regarding his medicines.  The following changes were made today:  none  Labs/ tests ordered today include:  Orders Placed This Encounter  Procedures   Magnesium   Basic metabolic panel with GFR   EKG 87-Ozji     Disposition: Follow up with Dr. Kennyth or EP APP in 6 months   Signed, Kanika Bungert, NP  12/11/24  4:43 PM  Electrophysiology [facility] "

## 2024-12-11 ENCOUNTER — Encounter: Payer: Self-pay | Admitting: Cardiology

## 2024-12-11 ENCOUNTER — Ambulatory Visit: Admitting: Cardiology

## 2024-12-11 VITALS — BP 126/60 | HR 61 | Ht 66.0 in | Wt 163.4 lb

## 2024-12-11 DIAGNOSIS — I4729 Other ventricular tachycardia: Secondary | ICD-10-CM | POA: Diagnosis not present

## 2024-12-11 DIAGNOSIS — Z79899 Other long term (current) drug therapy: Secondary | ICD-10-CM

## 2024-12-11 DIAGNOSIS — I498 Other specified cardiac arrhythmias: Secondary | ICD-10-CM | POA: Diagnosis not present

## 2024-12-11 DIAGNOSIS — Z5181 Encounter for therapeutic drug level monitoring: Secondary | ICD-10-CM

## 2024-12-11 DIAGNOSIS — I471 Supraventricular tachycardia, unspecified: Secondary | ICD-10-CM

## 2024-12-11 DIAGNOSIS — Z950 Presence of cardiac pacemaker: Secondary | ICD-10-CM

## 2024-12-11 NOTE — Patient Instructions (Signed)
 Medication Instructions:  Your physician recommends that you continue on your current medications as directed. Please refer to the Current Medication list given to you today.  *If you need a refill on your cardiac medications before your next appointment, please call your pharmacy*  Lab Work: Your provider would like for you to have following labs drawn today BMP and Magnesium.     Testing/Procedures: No test ordered today   Follow-Up:  Keep up the excellent job on staying hydrated!   At [facility], you and your health needs are our priority.  As part of our continuing mission to provide you with exceptional heart care, our providers are all part of one team.  This team includes your primary Cardiologist (physician) and Advanced Practice Providers or APPs (Physician Assistants and Nurse Practitioners) who all work together to provide you with the care you need, when you need it.  Your next appointment:   6 month(s)  Provider:   Fonda Kitty, MD or Suzann Riddle, NP

## 2024-12-12 ENCOUNTER — Ambulatory Visit: Payer: Self-pay | Admitting: Cardiology

## 2024-12-12 LAB — BASIC METABOLIC PANEL WITH GFR
BUN/Creatinine Ratio: 13 (ref 10–24)
BUN: 15 mg/dL (ref 8–27)
CO2: 20 mmol/L (ref 20–29)
Calcium: 9.1 mg/dL (ref 8.6–10.2)
Chloride: 109 mmol/L — ABNORMAL HIGH (ref 96–106)
Creatinine, Ser: 1.18 mg/dL (ref 0.76–1.27)
Glucose: 93 mg/dL (ref 70–99)
Potassium: 4.4 mmol/L (ref 3.5–5.2)
Sodium: 145 mmol/L — ABNORMAL HIGH (ref 134–144)
eGFR: 64 mL/min/{1.73_m2}

## 2024-12-12 LAB — MAGNESIUM: Magnesium: 2.3 mg/dL (ref 1.6–2.3)

## 2024-12-12 NOTE — Progress Notes (Signed)
 Remote PPM Transmission
